# Patient Record
Sex: Female | Born: 1935 | Race: White | Hispanic: No | Smoking: Never smoker
Health system: Southern US, Community
[De-identification: ages and names within clinical notes are randomized; demographics above are authoritative.]

## PROBLEM LIST (undated history)

## (undated) DIAGNOSIS — I82409 Acute embolism and thrombosis of unspecified deep veins of unspecified lower extremity: Secondary | ICD-10-CM

## (undated) DIAGNOSIS — M1990 Unspecified osteoarthritis, unspecified site: Secondary | ICD-10-CM

## (undated) DIAGNOSIS — H269 Unspecified cataract: Secondary | ICD-10-CM

## (undated) HISTORY — PX: CHOLECYSTECTOMY: SHX55

## (undated) HISTORY — DX: Acute embolism and thrombosis of unspecified deep veins of unspecified lower extremity: I82.409

## (undated) HISTORY — DX: Unspecified osteoarthritis, unspecified site: M19.90

## (undated) HISTORY — DX: Unspecified cataract: H26.9

---

## 2008-01-12 ENCOUNTER — Ambulatory Visit: Payer: Self-pay | Admitting: Family Medicine

## 2008-04-13 ENCOUNTER — Ambulatory Visit: Payer: Self-pay | Admitting: Family Medicine

## 2010-09-25 ENCOUNTER — Ambulatory Visit: Payer: Self-pay | Admitting: Dermatology

## 2013-08-03 ENCOUNTER — Encounter (INDEPENDENT_AMBULATORY_CARE_PROVIDER_SITE_OTHER): Payer: Medicare Other | Admitting: Ophthalmology

## 2013-08-03 DIAGNOSIS — H2510 Age-related nuclear cataract, unspecified eye: Secondary | ICD-10-CM

## 2013-08-03 DIAGNOSIS — H43819 Vitreous degeneration, unspecified eye: Secondary | ICD-10-CM

## 2013-08-03 DIAGNOSIS — H348192 Central retinal vein occlusion, unspecified eye, stable: Secondary | ICD-10-CM

## 2013-08-03 DIAGNOSIS — H35039 Hypertensive retinopathy, unspecified eye: Secondary | ICD-10-CM

## 2013-08-03 DIAGNOSIS — I10 Essential (primary) hypertension: Secondary | ICD-10-CM

## 2013-08-31 ENCOUNTER — Encounter (INDEPENDENT_AMBULATORY_CARE_PROVIDER_SITE_OTHER): Payer: Medicare Other | Admitting: Ophthalmology

## 2013-09-07 ENCOUNTER — Encounter (INDEPENDENT_AMBULATORY_CARE_PROVIDER_SITE_OTHER): Payer: Medicare Other | Admitting: Ophthalmology

## 2013-09-14 ENCOUNTER — Encounter (INDEPENDENT_AMBULATORY_CARE_PROVIDER_SITE_OTHER): Payer: Medicare Other | Admitting: Ophthalmology

## 2013-09-14 DIAGNOSIS — H43819 Vitreous degeneration, unspecified eye: Secondary | ICD-10-CM

## 2013-09-14 DIAGNOSIS — H348192 Central retinal vein occlusion, unspecified eye, stable: Secondary | ICD-10-CM

## 2013-09-14 DIAGNOSIS — H2510 Age-related nuclear cataract, unspecified eye: Secondary | ICD-10-CM

## 2013-10-26 ENCOUNTER — Encounter (INDEPENDENT_AMBULATORY_CARE_PROVIDER_SITE_OTHER): Payer: Medicare Other | Admitting: Ophthalmology

## 2013-10-26 DIAGNOSIS — H43819 Vitreous degeneration, unspecified eye: Secondary | ICD-10-CM

## 2013-10-26 DIAGNOSIS — H348192 Central retinal vein occlusion, unspecified eye, stable: Secondary | ICD-10-CM

## 2013-10-26 DIAGNOSIS — I10 Essential (primary) hypertension: Secondary | ICD-10-CM

## 2013-10-26 DIAGNOSIS — H35039 Hypertensive retinopathy, unspecified eye: Secondary | ICD-10-CM

## 2013-10-26 DIAGNOSIS — H3530 Unspecified macular degeneration: Secondary | ICD-10-CM

## 2013-12-14 ENCOUNTER — Encounter (INDEPENDENT_AMBULATORY_CARE_PROVIDER_SITE_OTHER): Payer: Medicare Other | Admitting: Ophthalmology

## 2013-12-14 DIAGNOSIS — H35039 Hypertensive retinopathy, unspecified eye: Secondary | ICD-10-CM

## 2013-12-14 DIAGNOSIS — I10 Essential (primary) hypertension: Secondary | ICD-10-CM

## 2013-12-14 DIAGNOSIS — H3530 Unspecified macular degeneration: Secondary | ICD-10-CM

## 2013-12-14 DIAGNOSIS — H43819 Vitreous degeneration, unspecified eye: Secondary | ICD-10-CM

## 2013-12-14 DIAGNOSIS — H2510 Age-related nuclear cataract, unspecified eye: Secondary | ICD-10-CM

## 2013-12-14 DIAGNOSIS — H348192 Central retinal vein occlusion, unspecified eye, stable: Secondary | ICD-10-CM

## 2014-02-01 ENCOUNTER — Encounter (INDEPENDENT_AMBULATORY_CARE_PROVIDER_SITE_OTHER): Payer: Medicare Other | Admitting: Ophthalmology

## 2014-02-01 DIAGNOSIS — H35039 Hypertensive retinopathy, unspecified eye: Secondary | ICD-10-CM

## 2014-02-01 DIAGNOSIS — H3530 Unspecified macular degeneration: Secondary | ICD-10-CM

## 2014-02-01 DIAGNOSIS — H2510 Age-related nuclear cataract, unspecified eye: Secondary | ICD-10-CM

## 2014-02-01 DIAGNOSIS — H43819 Vitreous degeneration, unspecified eye: Secondary | ICD-10-CM

## 2014-02-01 DIAGNOSIS — H348192 Central retinal vein occlusion, unspecified eye, stable: Secondary | ICD-10-CM

## 2014-02-01 DIAGNOSIS — I10 Essential (primary) hypertension: Secondary | ICD-10-CM

## 2014-02-24 ENCOUNTER — Ambulatory Visit (INDEPENDENT_AMBULATORY_CARE_PROVIDER_SITE_OTHER): Payer: Medicare Other | Admitting: Family Medicine

## 2014-02-24 VITALS — BP 128/82 | HR 80 | Temp 97.9°F | Resp 16 | Ht 63.75 in | Wt 165.8 lb

## 2014-02-24 DIAGNOSIS — M1710 Unilateral primary osteoarthritis, unspecified knee: Secondary | ICD-10-CM

## 2014-02-24 DIAGNOSIS — IMO0002 Reserved for concepts with insufficient information to code with codable children: Secondary | ICD-10-CM

## 2014-02-24 DIAGNOSIS — M79661 Pain in right lower leg: Secondary | ICD-10-CM

## 2014-02-24 DIAGNOSIS — M1711 Unilateral primary osteoarthritis, right knee: Secondary | ICD-10-CM

## 2014-02-24 DIAGNOSIS — M79609 Pain in unspecified limb: Secondary | ICD-10-CM

## 2014-02-24 MED ORDER — METHYLPREDNISOLONE ACETATE 40 MG/ML INJ SUSP (RADIOLOG
80.0000 mg | Freq: Once | INTRAMUSCULAR | Status: AC
  Administered 2014-02-24: 80 mg via INTRA_ARTICULAR

## 2014-02-24 NOTE — Progress Notes (Signed)
80 yo woman who twisted right knee last week and has had persistent pain in the knee ever since with some swelling.  She has had some intermittent pain after activity in the past.  Rx:  Ibuprofen helps for 2 hours  Objective:  NAD Right knee:  Mild synovial thickening, no crepitus, mild medial tenderness, good ROM  Right knee injected without problem with marcaine and depomedrol 80 without complication  Assessment:  Knee arthritis  Plan:  RTC prn.  Craig Staggers, MD

## 2014-02-24 NOTE — Addendum Note (Signed)
Addended by: Roselee Nova A on: 02/24/2014 03:24 PM   Modules accepted: Level of Service

## 2014-02-26 ENCOUNTER — Telehealth: Payer: Self-pay

## 2014-02-26 NOTE — Telephone Encounter (Signed)
Please advise Pain Medication. Pt only received prednisone at [facility].

## 2014-02-26 NOTE — Telephone Encounter (Signed)
Please advise this patient to RTC tomorrow to see Dr. Joseph Art.

## 2014-02-26 NOTE — Telephone Encounter (Signed)
Patient states she is still in pain and wants to know if she can be prescribed something for it. Please return call and advise. CB # [phone]

## 2014-02-26 NOTE — Telephone Encounter (Signed)
Patient called again regarding medication. Please return call.

## 2014-02-27 NOTE — Telephone Encounter (Signed)
Left message to return call 

## 2014-02-27 NOTE — Telephone Encounter (Signed)
Patient notified and voiced understanding. She will RTC tomorrow to see Dr. Joseph Art

## 2014-02-28 ENCOUNTER — Ambulatory Visit (INDEPENDENT_AMBULATORY_CARE_PROVIDER_SITE_OTHER): Payer: Medicare Other

## 2014-02-28 ENCOUNTER — Ambulatory Visit (INDEPENDENT_AMBULATORY_CARE_PROVIDER_SITE_OTHER): Payer: Medicare Other | Admitting: Family Medicine

## 2014-02-28 VITALS — BP 160/78 | HR 68 | Temp 98.1°F | Resp 15 | Ht 64.0 in | Wt 165.8 lb

## 2014-02-28 DIAGNOSIS — M25569 Pain in unspecified knee: Secondary | ICD-10-CM

## 2014-02-28 DIAGNOSIS — M1710 Unilateral primary osteoarthritis, unspecified knee: Secondary | ICD-10-CM

## 2014-02-28 DIAGNOSIS — M25561 Pain in right knee: Secondary | ICD-10-CM

## 2014-02-28 DIAGNOSIS — IMO0002 Reserved for concepts with insufficient information to code with codable children: Secondary | ICD-10-CM

## 2014-02-28 DIAGNOSIS — M1711 Unilateral primary osteoarthritis, right knee: Secondary | ICD-10-CM

## 2014-02-28 MED ORDER — PREDNISONE 20 MG PO TABS
40.0000 mg | ORAL_TABLET | Freq: Every day | ORAL | Status: DC
Start: 2014-02-28 — End: 2014-12-09

## 2014-02-28 MED ORDER — PREDNISONE 20 MG PO TABS: 40.0000 mg | ORAL_TABLET | Freq: Every day | ORAL | Status: DC

## 2014-02-28 NOTE — Progress Notes (Signed)
80 year old woman who is here for followup regarding her right knee pain. She an injection of cortisone 4 days ago it really hasn't improved. She does notice improvement when she takes Advil but it's hurting day and night, more so when she puts weight on it.  Objective: No acute distress We're able to pinpoint an area of tenderness along the medial joint line just anterior to the MCL. She has full range of motion. There is ecchymosis of the area of the injection done a couple days ago but this is nontender and there is no erythema or effusion.  [facility] reading (PRIMARY) by  Dr. Joseph Art.  Right knee: mild to moderate arthritic changes with preservation of joint line.  Small bony body medial central area.  Knee pain, right - Plan: DG Knee Complete 4 Views Right, predniSONE (DELTASONE) 20 MG tablet, DISCONTINUED: predniSONE (DELTASONE) 20 MG tablet  Arthritis of knee, right - Plan: predniSONE (DELTASONE) 20 MG tablet, DISCONTINUED: predniSONE (DELTASONE) 20 MG tablet  May need ortho referral if not better in 48 hours Signed, Robyn Haber, MD

## 2014-02-28 NOTE — Patient Instructions (Signed)
If not improving in 48 hours, please call me to discuss an orthopedic referral

## 2014-03-03 ENCOUNTER — Telehealth: Payer: Self-pay

## 2014-03-03 DIAGNOSIS — IMO0002 Reserved for concepts with insufficient information to code with codable children: Principal | ICD-10-CM

## 2014-03-03 DIAGNOSIS — M25561 Pain in right knee: Secondary | ICD-10-CM

## 2014-03-03 DIAGNOSIS — M1710 Unilateral primary osteoarthritis, unspecified knee: Secondary | ICD-10-CM

## 2014-03-03 NOTE — Telephone Encounter (Signed)
Per patient Dr. Joseph Art requested that she call to give an update on how she was doing. Per patient she has been feeling better but started having pain in her right knee today. She went back to work yesterday and today she has been walking a lot. Patient will take her last dose of "prednisone" tomorrow and has 1 refill left. Patients call back number is [phone]

## 2014-03-04 NOTE — Telephone Encounter (Signed)
Called pt back and advised that we can not RF the prednisone d/t not being recommended to stay on prednisone for multiple rounds. Pt understood. D/W pt that per Dr Lenn Cal [facility] notes he planned to refer to ortho if knee pain lingered more than 48 hrs. Pt agreed to referral and I am putting in order for Dr L. Dr Carlean Jews, Juluis Rainier.

## 2014-03-04 NOTE — Telephone Encounter (Signed)
Pt called back and explained that she mistakenly stated that she had one more refill of prednisone, she actually DOES need more prednisone because of her knee problems.

## 2014-03-08 ENCOUNTER — Encounter (INDEPENDENT_AMBULATORY_CARE_PROVIDER_SITE_OTHER): Payer: Medicare Other | Admitting: Ophthalmology

## 2014-03-08 DIAGNOSIS — H35039 Hypertensive retinopathy, unspecified eye: Secondary | ICD-10-CM

## 2014-03-08 DIAGNOSIS — H348192 Central retinal vein occlusion, unspecified eye, stable: Secondary | ICD-10-CM

## 2014-03-08 DIAGNOSIS — H43819 Vitreous degeneration, unspecified eye: Secondary | ICD-10-CM

## 2014-03-08 DIAGNOSIS — I10 Essential (primary) hypertension: Secondary | ICD-10-CM

## 2014-03-08 DIAGNOSIS — H2510 Age-related nuclear cataract, unspecified eye: Secondary | ICD-10-CM

## 2014-04-19 ENCOUNTER — Encounter (INDEPENDENT_AMBULATORY_CARE_PROVIDER_SITE_OTHER): Payer: Medicare Other | Admitting: Ophthalmology

## 2014-04-19 DIAGNOSIS — I10 Essential (primary) hypertension: Secondary | ICD-10-CM

## 2014-04-19 DIAGNOSIS — H3530 Unspecified macular degeneration: Secondary | ICD-10-CM

## 2014-04-19 DIAGNOSIS — H43819 Vitreous degeneration, unspecified eye: Secondary | ICD-10-CM

## 2014-04-19 DIAGNOSIS — H2510 Age-related nuclear cataract, unspecified eye: Secondary | ICD-10-CM

## 2014-04-19 DIAGNOSIS — H348192 Central retinal vein occlusion, unspecified eye, stable: Secondary | ICD-10-CM

## 2014-04-19 DIAGNOSIS — H35039 Hypertensive retinopathy, unspecified eye: Secondary | ICD-10-CM

## 2014-05-31 ENCOUNTER — Encounter (INDEPENDENT_AMBULATORY_CARE_PROVIDER_SITE_OTHER): Payer: Medicare Other | Admitting: Ophthalmology

## 2014-05-31 DIAGNOSIS — H43813 Vitreous degeneration, bilateral: Secondary | ICD-10-CM

## 2014-05-31 DIAGNOSIS — H3531 Nonexudative age-related macular degeneration: Secondary | ICD-10-CM

## 2014-05-31 DIAGNOSIS — H43811 Vitreous degeneration, right eye: Secondary | ICD-10-CM

## 2014-05-31 DIAGNOSIS — H35033 Hypertensive retinopathy, bilateral: Secondary | ICD-10-CM

## 2014-05-31 DIAGNOSIS — I10 Essential (primary) hypertension: Secondary | ICD-10-CM

## 2014-05-31 DIAGNOSIS — H2511 Age-related nuclear cataract, right eye: Secondary | ICD-10-CM

## 2014-06-07 ENCOUNTER — Encounter (INDEPENDENT_AMBULATORY_CARE_PROVIDER_SITE_OTHER): Payer: Medicare Other | Admitting: Ophthalmology

## 2014-06-07 DIAGNOSIS — H34811 Central retinal vein occlusion, right eye: Secondary | ICD-10-CM

## 2014-07-05 ENCOUNTER — Encounter (INDEPENDENT_AMBULATORY_CARE_PROVIDER_SITE_OTHER): Payer: Medicare Other | Admitting: Ophthalmology

## 2014-07-05 DIAGNOSIS — H3531 Nonexudative age-related macular degeneration: Secondary | ICD-10-CM

## 2014-07-05 DIAGNOSIS — H43813 Vitreous degeneration, bilateral: Secondary | ICD-10-CM

## 2014-07-05 DIAGNOSIS — I10 Essential (primary) hypertension: Secondary | ICD-10-CM

## 2014-07-05 DIAGNOSIS — H34811 Central retinal vein occlusion, right eye: Secondary | ICD-10-CM

## 2014-07-05 DIAGNOSIS — H35033 Hypertensive retinopathy, bilateral: Secondary | ICD-10-CM

## 2014-08-02 ENCOUNTER — Encounter (INDEPENDENT_AMBULATORY_CARE_PROVIDER_SITE_OTHER): Payer: Medicare Other | Admitting: Ophthalmology

## 2014-08-02 DIAGNOSIS — H3531 Nonexudative age-related macular degeneration: Secondary | ICD-10-CM

## 2014-08-02 DIAGNOSIS — I10 Essential (primary) hypertension: Secondary | ICD-10-CM

## 2014-08-02 DIAGNOSIS — H43813 Vitreous degeneration, bilateral: Secondary | ICD-10-CM

## 2014-08-02 DIAGNOSIS — H35033 Hypertensive retinopathy, bilateral: Secondary | ICD-10-CM

## 2014-08-02 DIAGNOSIS — H34811 Central retinal vein occlusion, right eye: Secondary | ICD-10-CM

## 2014-09-06 ENCOUNTER — Encounter (INDEPENDENT_AMBULATORY_CARE_PROVIDER_SITE_OTHER): Payer: Medicare Other | Admitting: Ophthalmology

## 2014-09-06 DIAGNOSIS — H34811 Central retinal vein occlusion, right eye: Secondary | ICD-10-CM

## 2014-09-06 DIAGNOSIS — H2511 Age-related nuclear cataract, right eye: Secondary | ICD-10-CM

## 2014-09-06 DIAGNOSIS — H35033 Hypertensive retinopathy, bilateral: Secondary | ICD-10-CM

## 2014-09-06 DIAGNOSIS — H3531 Nonexudative age-related macular degeneration: Secondary | ICD-10-CM

## 2014-09-06 DIAGNOSIS — I10 Essential (primary) hypertension: Secondary | ICD-10-CM

## 2014-09-06 DIAGNOSIS — H43813 Vitreous degeneration, bilateral: Secondary | ICD-10-CM

## 2014-10-11 ENCOUNTER — Encounter (INDEPENDENT_AMBULATORY_CARE_PROVIDER_SITE_OTHER): Payer: Medicare Other | Admitting: Ophthalmology

## 2014-10-11 DIAGNOSIS — H2511 Age-related nuclear cataract, right eye: Secondary | ICD-10-CM

## 2014-10-11 DIAGNOSIS — H35033 Hypertensive retinopathy, bilateral: Secondary | ICD-10-CM

## 2014-10-11 DIAGNOSIS — I10 Essential (primary) hypertension: Secondary | ICD-10-CM

## 2014-10-11 DIAGNOSIS — H34811 Central retinal vein occlusion, right eye: Secondary | ICD-10-CM

## 2014-10-11 DIAGNOSIS — H3531 Nonexudative age-related macular degeneration: Secondary | ICD-10-CM | POA: Diagnosis not present

## 2014-10-11 DIAGNOSIS — H43813 Vitreous degeneration, bilateral: Secondary | ICD-10-CM

## 2014-11-08 ENCOUNTER — Encounter (INDEPENDENT_AMBULATORY_CARE_PROVIDER_SITE_OTHER): Payer: Medicare Other | Admitting: Ophthalmology

## 2014-11-08 DIAGNOSIS — H35033 Hypertensive retinopathy, bilateral: Secondary | ICD-10-CM | POA: Diagnosis not present

## 2014-11-08 DIAGNOSIS — H43813 Vitreous degeneration, bilateral: Secondary | ICD-10-CM | POA: Diagnosis not present

## 2014-11-08 DIAGNOSIS — I10 Essential (primary) hypertension: Secondary | ICD-10-CM | POA: Diagnosis not present

## 2014-11-08 DIAGNOSIS — H3531 Nonexudative age-related macular degeneration: Secondary | ICD-10-CM

## 2014-11-08 DIAGNOSIS — H34811 Central retinal vein occlusion, right eye: Secondary | ICD-10-CM

## 2014-11-08 DIAGNOSIS — H2511 Age-related nuclear cataract, right eye: Secondary | ICD-10-CM

## 2014-11-10 ENCOUNTER — Inpatient Hospital Stay
Admit: 2014-11-10 | Disposition: A | Discharge status: Skilled Nursing Facility | Payer: Self-pay | Attending: Internal Medicine | Admitting: Internal Medicine

## 2014-11-10 HISTORY — PX: FEMUR SURGERY: SHX943

## 2014-11-10 LAB — BASIC METABOLIC PANEL
Anion Gap: 8 (ref 7–16)
BUN: 17 mg/dL
Calcium, Total: 8.5 mg/dL — ABNORMAL LOW
Chloride: 107 mmol/L
Co2: 27 mmol/L
Creatinine: 0.64 mg/dL
GLUCOSE: 99 mg/dL
Potassium: 3.7 mmol/L
SODIUM: 142 mmol/L

## 2014-11-10 LAB — URINALYSIS, COMPLETE
BACTERIA: NONE SEEN
Bilirubin,UR: NEGATIVE
Blood: NEGATIVE
Glucose,UR: NEGATIVE mg/dL (ref 0–75)
Leukocyte Esterase: NEGATIVE
NITRITE: NEGATIVE
Ph: 6 (ref 4.5–8.0)
Protein: NEGATIVE
Specific Gravity: 1.016 (ref 1.003–1.030)
Squamous Epithelial: <1

## 2014-11-10 LAB — CBC WITH DIFFERENTIAL/PLATELET
BASOS ABS: 0 10*3/uL (ref 0.0–0.1)
Basophil %: 0.4 %
EOS ABS: 0.1 10*3/uL (ref 0.0–0.7)
Eosinophil %: 0.8 %
HCT: 38.2 % (ref 35.0–47.0)
HGB: 12.4 g/dL (ref 12.0–16.0)
LYMPHS ABS: 1 10*3/uL (ref 1.0–3.6)
Lymphocyte %: 9.8 %
MCH: 29.2 pg (ref 26.0–34.0)
MCHC: 32.4 g/dL (ref 32.0–36.0)
MCV: 90 fL (ref 80–100)
MONO ABS: 0.4 x10 3/mm (ref 0.2–0.9)
Monocyte %: 3.4 %
Neutrophil #: 8.9 10*3/uL — ABNORMAL HIGH (ref 1.4–6.5)
Neutrophil %: 85.6 %
Platelet: 185 10*3/uL (ref 150–440)
RBC: 4.23 10*6/uL (ref 3.80–5.20)
RDW: 12.6 % (ref 11.5–14.5)
WBC: 10.4 10*3/uL (ref 3.6–11.0)

## 2014-11-10 LAB — PROTIME-INR
INR: 1
PROTHROMBIN TIME: 13.3 s

## 2014-11-10 LAB — APTT: ACTIVATED PTT: 41.4 s — AB (ref 23.6–35.9)

## 2014-11-11 LAB — CBC WITH DIFFERENTIAL/PLATELET
BASOS PCT: 0.5 %
Basophil #: 0 10*3/uL (ref 0.0–0.1)
EOS ABS: 0.1 10*3/uL (ref 0.0–0.7)
EOS PCT: 1.3 %
HCT: 34.7 % — ABNORMAL LOW (ref 35.0–47.0)
HGB: 11.6 g/dL — ABNORMAL LOW (ref 12.0–16.0)
LYMPHS ABS: 0.7 10*3/uL — AB (ref 1.0–3.6)
Lymphocyte %: 10.1 %
MCH: 30 pg (ref 26.0–34.0)
MCHC: 33.3 g/dL (ref 32.0–36.0)
MCV: 90 fL (ref 80–100)
MONO ABS: 0.5 x10 3/mm (ref 0.2–0.9)
Monocyte %: 6.1 %
NEUTROS PCT: 82 %
Neutrophil #: 6.1 10*3/uL (ref 1.4–6.5)
PLATELETS: 153 10*3/uL (ref 150–440)
RBC: 3.85 10*6/uL (ref 3.80–5.20)
RDW: 12.2 % (ref 11.5–14.5)
WBC: 7.4 10*3/uL (ref 3.6–11.0)

## 2014-11-11 LAB — BASIC METABOLIC PANEL
Anion Gap: 6 — ABNORMAL LOW (ref 7–16)
BUN: 14 mg/dL
CALCIUM: 8.1 mg/dL — AB
CHLORIDE: 104 mmol/L
CO2: 29 mmol/L
Creatinine: 0.66 mg/dL
EGFR (Non-African Amer.): >60
Glucose: 107 mg/dL — ABNORMAL HIGH
Potassium: 3.8 mmol/L
SODIUM: 139 mmol/L

## 2014-11-11 LAB — MAGNESIUM: Magnesium: 1.8 mg/dL

## 2014-11-12 LAB — CBC WITH DIFFERENTIAL/PLATELET
Basophil #: 0 10*3/uL (ref 0.0–0.1)
Basophil %: 0.2 %
EOS ABS: 0 10*3/uL (ref 0.0–0.7)
Eosinophil %: 0 %
HCT: 32.6 % — AB (ref 35.0–47.0)
HGB: 10.9 g/dL — ABNORMAL LOW (ref 12.0–16.0)
LYMPHS ABS: 0.3 10*3/uL — AB (ref 1.0–3.6)
Lymphocyte %: 4 %
MCH: 29.9 pg (ref 26.0–34.0)
MCHC: 33.4 g/dL (ref 32.0–36.0)
MCV: 90 fL (ref 80–100)
MONO ABS: 0.4 x10 3/mm (ref 0.2–0.9)
MONOS PCT: 5.4 %
NEUTROS ABS: 7.3 10*3/uL — AB (ref 1.4–6.5)
Neutrophil %: 90.4 %
Platelet: 138 10*3/uL — ABNORMAL LOW (ref 150–440)
RBC: 3.64 10*6/uL — ABNORMAL LOW (ref 3.80–5.20)
RDW: 12.2 % (ref 11.5–14.5)
WBC: 8.1 10*3/uL (ref 3.6–11.0)

## 2014-11-12 LAB — BASIC METABOLIC PANEL
Anion Gap: 4 — ABNORMAL LOW (ref 7–16)
BUN: 15 mg/dL
CO2: 26 mmol/L
CREATININE: 0.65 mg/dL
Calcium, Total: 7.8 mg/dL — ABNORMAL LOW
Chloride: 108 mmol/L
EGFR (African American): >60
Glucose: 154 mg/dL — ABNORMAL HIGH
Potassium: 4 mmol/L
SODIUM: 138 mmol/L

## 2014-11-13 LAB — CBC WITH DIFFERENTIAL/PLATELET
BASOS ABS: 0 10*3/uL (ref 0.0–0.1)
Basophil %: 0.5 %
Eosinophil #: 0.4 10*3/uL (ref 0.0–0.7)
Eosinophil %: 5.3 %
HCT: 31 % — AB (ref 35.0–47.0)
HGB: 10.2 g/dL — ABNORMAL LOW (ref 12.0–16.0)
LYMPHS ABS: 1 10*3/uL (ref 1.0–3.6)
Lymphocyte %: 12.6 %
MCH: 29.7 pg (ref 26.0–34.0)
MCHC: 32.9 g/dL (ref 32.0–36.0)
MCV: 90 fL (ref 80–100)
MONO ABS: 0.6 x10 3/mm (ref 0.2–0.9)
MONOS PCT: 7.5 %
NEUTROS ABS: 6 10*3/uL (ref 1.4–6.5)
Neutrophil %: 74.1 %
Platelet: 135 10*3/uL — ABNORMAL LOW (ref 150–440)
RBC: 3.44 10*6/uL — ABNORMAL LOW (ref 3.80–5.20)
RDW: 12.3 % (ref 11.5–14.5)
WBC: 8.1 10*3/uL (ref 3.6–11.0)

## 2014-11-14 LAB — CBC WITH DIFFERENTIAL/PLATELET
BASOS PCT: 0.9 %
Basophil #: 0.1 10*3/uL (ref 0.0–0.1)
EOS ABS: 0.4 10*3/uL (ref 0.0–0.7)
EOS PCT: 6.2 %
HCT: 31.1 % — AB (ref 35.0–47.0)
HGB: 10.4 g/dL — ABNORMAL LOW (ref 12.0–16.0)
LYMPHS PCT: 16.3 %
Lymphocyte #: 1.1 10*3/uL (ref 1.0–3.6)
MCH: 30.1 pg (ref 26.0–34.0)
MCHC: 33.5 g/dL (ref 32.0–36.0)
MCV: 90 fL (ref 80–100)
MONOS PCT: 8.2 %
Monocyte #: 0.6 x10 3/mm (ref 0.2–0.9)
NEUTROS ABS: 4.7 10*3/uL (ref 1.4–6.5)
Neutrophil %: 68.4 %
Platelet: 151 10*3/uL (ref 150–440)
RBC: 3.45 10*6/uL — ABNORMAL LOW (ref 3.80–5.20)
RDW: 12.4 % (ref 11.5–14.5)
WBC: 6.9 10*3/uL (ref 3.6–11.0)

## 2014-11-15 ENCOUNTER — Encounter
Admit: 2014-11-15 | Disposition: A | Discharge status: Home / Self Care | Payer: Self-pay | Attending: Internal Medicine | Admitting: Internal Medicine

## 2014-12-05 NOTE — H&P (Signed)
PATIENT NAME:  Elizabeth Fernandez, TREGRE MR#:  [id number] DATE OF BIRTH:  Dec 05, 1933  DATE OF ADMISSION:  11/10/2014  PRIMARY CARE PHYSICIAN:  Dr. Lelon Huh.   REQUESTING PHYSICIAN:  Dr. Nance Pear.    CHIEF COMPLAINT:  Left hip pain.   HISTORY OF PRESENT ILLNESS: The patient is a 81 year old female with a known history of degenerative joint disease, is being admitted for left hip fracture.  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 While in the ED she was found to have comminuted left intertrochanteric fracture and is being admitted for further evaluation and management.   PAST MEDICAL HISTORY:  1.  Degenerative joint disease.  2.  Cataract.   MEDICATIONS AT HOME: Ibuprofen 200 mg 2 tablets p.o. daily.   ALLERGIES: No known drug allergies.   SOCIAL HISTORY: No smoking.  No alcohol. No drugs of abuse. She works at [employer] in [employer], community.   FAMILY HISTORY: Her father died of heart attack, mother had cancer.   PAST SURGICAL HISTORY: Gallbladder surgery.   REVIEW OF SYSTEMS:   CONSTITUTIONAL: No fever, fatigue, weakness.  EYES: No blurred or double vision.  EARS, NOSE, AND THROAT: No tinnitus or ear pain.  RESPIRATORY: No cough, wheeze, or hemoptysis.  CARDIOVASCULAR: No chest pain, orthopnea, edema.  GASTROINTESTINAL: No nausea, vomiting, diarrhea.   GENITOURINARY:  No dysuria, hematuria.   ENDOCRINE:  No polyuria, nocturia.   HEMATOLOGY:  No anemia or easy bruising.   SKIN: No rash or lesion.  MUSCULOSKELETAL: Left hip pain status post fall.  NEUROLOGIC: No tingling, numbness, weakness.  PSYCHIATRY: No history of anxiety or depression.   PHYSICAL EXAMINATION:  VITAL SIGNS: Temperature 98, heart rate 74, respirations 20, blood  pressure 155/93, she is saturating 97% on room air.  GENERAL: The patient is a 81 year old female lying in the bed comfortably without any acute distress.  EYES: Pupils equal, round, and reactive to light and accommodation. No scleral icterus. Extraocular muscles intact.  HEENT: Head is atraumatic, normocephalic.  Oropharynx and nasopharynx clear.   NECK: Supple.  No jugular venous distention.  No thyroid enlargement or tenderness.  LUNGS:  Clear to auscultation bilaterally.  No wheezing, rales, rhonchi, crepitation.   CARDIOVASCULAR:  S1, S2 normal.  No murmurs, gallops.   ABDOMEN:  Soft, nondistended. Bowel sounds present. No organomegaly or masses.  EXTREMITIES: No pedal edema. No cyanosis or clubbing. She has left hip tenderness and some external rotation.  NEUROLOGIC: Cranial nerves II through XII intact.  Muscle strength 5 out of 5 in extremities.  Did not check her left lower extremity strength as she was having a lot of pain.  PSYCHIATRY: The patient is alert and oriented x 3.  SKIN: No obvious rash or lesions.   MUSCULOSKELETAL:  No joint effusion or tenderness except left hip area tenderness.   LABORATORY PANEL: Normal BMP. Normal CBC. Normal coagulation panel.   EKG showed sinus rhythm, no major ST changes.    Chest x-ray showed chronic changes, no acute cardiopulmonary disease.   Left hip x-ray showed comminuted left intertrochanteric fracture.  IMPRESSION AND PLAN:  1.  Preoperative clearance for hip fracture, medically cleared for planned surgery. She will be at low risk. She does not have any  acute cardiac or lung disease or any symptoms. EKG looks unremarkable.   2.  Comminuted left intertrochanteric fracture status post mechanical fall.  Further management per orthopedics.  ER physician has discussed with Dr. Mack Guise.  3.  Degenerative joint disease. We will continue ibuprofen as needed for pain management per orthopedics.   4.  Deep vein thrombosis prophylaxis. We will  start heparin subcutaneous at this time.   CODE STATUS: Full code.   TOTAL TIME TAKING CARE OF THIS PATIENT: 40 minutes.     ____________________________ Lucina Mellow. Manuella Ghazi, MD vss:bu D: 11/10/2014 15:22:39 ET T: 11/10/2014 15:39:09 ET JOB#: [id number]  cc: Magdiel Bartles S. Manuella Ghazi, MD, <Dictator> Kirstie Peri. Caryn Section, MD Timoteo Gaul, MD Remer Macho MD ELECTRONICALLY SIGNED 11/11/2014 10:52

## 2014-12-05 NOTE — Consult Note (Signed)
PATIENT NAME:  Elizabeth Fernandez, Elizabeth Fernandez MR#:  [id number] DATE OF BIRTH:  1934-03-04  DATE OF CONSULTATION:  11/10/2014  REFERRING PHYSICIAN:   CONSULTING PHYSICIAN:  Timoteo Gaul, MD  HISTORY OF PRESENT ILLNESS: The patient is a 81 year old female who fell at work. She works at [employer]. She stepped off a podium at work, where she was working on [employer]. This misstep caused her to have an immediate intense pain in the left hip. She was unable to ambulate on this leg after this injury. EMS was contacted, and the patient was brought to the [facility] Emergency Department. She was diagnosed with a left intertrochanteric hip fracture by x-ray upon arrival to the Emergency Room. The patient is admitted to the hospitalist service for preoperative clearance, and orthopedics was consulted for management of her hip fracture.   PAST MEDICAL HISTORY INCLUDES: Degenerative joint disease and cataracts.   ALLERGIES: No known drug allergies.   MEDICATIONS AT HOME: Include only ibuprofen 200 mg 2 tablets p.o. daily.   SOCIAL HISTORY: The patient denies smoking, alcohol, or drug use. 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    PAST SURGICAL HISTORY: Includes cholecystectomy.   PHYSICAL EXAMINATION:  LEFT HIP: The patient's skin is intact. There is no erythema, ecchymosis, or swelling. During examination, the patient's left leg is in [place] traction. She has external rotation to the left lower extremity with shortening. She has a palpable dorsalis pedis pulse. She can flex and extend all 5 toes. She has intact sensation throughout the left lower extremity.   RADIOLOGY: X-ray films of the left hip, as well as an AP of the pelvis were reviewed. These films demonstrate a comminuted left intertrochanteric hip fracture, which is displaced. The hip joint remains located, and there are no associated pelvic fractures. There is degenerative disease seen in the lumbar spine.    ASSESSMENT: Left intertrochanteric hip fracture, displaced.   PLAN: I explained the patient's injury to her at the bedside today. I have recommended intramedullary fixation for this fracture to allow her to get up and stand and ambulate. I reviewed the risks and benefits of surgery, including infection, bleeding, nerve or blood vessel injury, malunion, nonunion, leg length discrepancy, change in lower extremity rotation, persistent pain, or hardware failure. The development of osteoarthritis and the need for further surgery, including conversion to a total hip arthroplasty. Medical risks include, but are not limited to DVT and pulmonary embolism, myocardial infarction, stroke, pneumonia, respiratory failure, and death. The patient understood these risks. She wishes to proceed with surgery. Her surgery is scheduled for tomorrow at approximately 1:00 p.m. She is n.p.o. after midnight. I have reviewed the patient's labs in preparation for surgery. She has been cleared by the hospitalist for intramedullary fixation of her left hip tomorrow.    ____________________________ Timoteo Gaul, MD klk:mw D: 11/10/2014 18:52:40 ET T: 11/10/2014 19:35:07 ET JOB#: [id number]  cc: Timoteo Gaul, MD, <Dictator> Timoteo Gaul MD ELECTRONICALLY SIGNED 11/12/2014 13:56

## 2014-12-05 NOTE — Discharge Summary (Signed)
PATIENT NAME:  Elizabeth Fernandez, Elizabeth Fernandez MR#:  [id number] DATE OF BIRTH:  17-Feb-1934  DATE OF ADMISSION:  11/10/2014 DATE OF DISCHARGE:  11/15/2014   DISCHARGE DIAGNOSES:  1.  Left hip fracture, status post internal fixation.  2.  Osteoarthritis.   SECONDARY DIAGNOSES:  1.  Degenerative joint disease.  2.  Cataract.   CONSULTATIONS:  Orthopedics, Dr. Thornton Park.   PROCEDURES AND RADIOLOGY: Intramedullary fixation of left intertrochanteric hip fracture on 11/11/2014.   Left hip x-ray on 11/10/2014 showed comminuted left intertrochanteric fracture.   Chest x-ray on 11/09/2014, showed no acute cardiopulmonary disease.   Left hip x-ray on 11/11/2014 showed ORIF of the left femur.   Pelvic x-ray on 11/11/2014 confirmed same.   Left hip x-ray showed post ORIF of the proximal left femur.   MAJOR LABORATORY PANEL: Urinalysis on admission was negative on 11/10/2014.   HISTORY AND SHORT HOSPITAL COURSE: The patient is a 81 year old female with the above-mentioned medical problems who was admitted for left hip pain status post fall, was found to have a comminuted left intertrochanteric hip fracture for which orthopedic consultation was obtained with Dr. Thornton Park who recommended surgery, which was performed on 11/11/2014 with intramedullary fixation of left intertrochanteric hip fracture.  Postoperatively, the patient was evaluated by physical and occupational therapy and was slowly improving. Rehab was recommended by the therapist, where she is being discharged in stable condition.   VITAL SIGNS: On the date of discharge, her vital signs were as follows: Temperature 98.1, heart rate 94 per minute, respirations 18 per minute, blood pressure 130/68. She is saturating 92% on room air.   PERTINENT PHYSICAL EXAMINATION THE DATE OF DISCHARGE:  CARDIOVASCULAR: S1, S2 normal. No murmurs, rubs, or gallop.  LUNGS: Clear to auscultation bilaterally. No wheezing, rales, rhonchi, crepitation.  ABDOMEN:  Soft, benign.  Surgical incision and dressing looks dry and clear. No signs of infection. Circulation sensation and motor function good in the lower extremities. All other physical examination remained at baseline.   DISCHARGE MEDICATIONS:     Medication Instructions  enoxaparin  30 milligram(s) subcutaneous 2 times a day   acetaminophen 325 mg oral tablet  2 tab(s) orally every 4 hours, As needed, -for temperature >100.5   magnesium hydroxide 8% oral suspension  30 milliliter(s) orally every 6 hours, As needed, constipation   ferrous sulfate 325 mg (65 mg elemental iron) oral tablet  1 tab(s) orally 2 times a day (with meals)   docusate calcium 240 mg oral capsule  1 cap(s) orally once a day (at bedtime)   calcium-vitamin d 500 mg-200 intl units oral tablet  1 tab(s) orally 2 times a day (with meals)   bisacodyl 10 mg rectal suppository  1 suppository(ies) rectal once a day (at bedtime), As needed, constipation   acetaminophen-hydrocodone 325 mg-5 mg oral tablet  1 tab(s) orally every 4 to 6 hours, As Needed, moderate pain (4-6/10)    DISCHARGE DIET: Regular.   DISCHARGE ACTIVITY: As tolerated.   DISCHARGE INSTRUCTIONS AND FOLLOW-UP: The patient was instructed to follow up with her primary care physician, Dr. Lelon Huh in 2 to 4 weeks. She will need follow-up with Dr. Thornton Park from orthopedics in 1 to 2 weeks. She will get physical therapy evaluation and management while at the facility.    TOTAL TIME SPENT ON THIS PATIENT:  45 minutes.  ____________________________ Lucina Mellow. Manuella Ghazi, MD vss:DT D: 11/15/2014 07:49:07 ET T: 11/15/2014 08:42:31 ET JOB#: [id number]  cc: Cailean Heacock S. Manuella Ghazi, MD, <Dictator> Elenore Rota  Courtney Heys, MD Timoteo Gaul, MD  Lucina Mellow [facility] MD ELECTRONICALLY SIGNED 11/18/2014 10:31

## 2014-12-05 NOTE — Op Note (Signed)
PATIENT NAME:  Elizabeth Fernandez, BRANDT MR#:  [id number] DATE OF BIRTH:  October 02, 1933  DATE OF PROCEDURE:  11/11/2014  PREOPERATIVE DIAGNOSIS: Left intertrochanteric hip fracture.  POSTOPERATIVE DIAGNOSIS: Left intertrochanteric hip fracture.   PROCEDURE: Intramedullary fixation of left intertrochanteric hip fracture.   SURGEON: Timoteo Gaul, MD   ANESTHESIA: General.   IMPLANTS: Biomet short Affixus nail (9 x 180 mm), a 10 x 100 mm lag screw and a 36 mm interlocking screw.   ESTIMATED BLOOD LOSS: 50 mL.   COMPLICATIONS: None.   INDICATIONS FOR PROCEDURE: The patient is a 81 year old female who sustained a fall stepping down off a platform at work. She had pain immediately following this incident and was unable to bear weight. She was brought to the [facility] Emergency Department where she was diagnosed with an intertrochanteric hip fracture. She was admitted to the hospitalist service for preoperative clearance. I explained to the patient about her injury and have recommended intramedullary fixation. She understands the risks of surgery include infection, bleeding, nerve or blood vessel injury, malunion, nonunion, leg length discrepancy, change in lower extremity rotation, persistent left hip pain, hardware failure and the need for further surgery. Medical risks include, but are not limited to DVT and pulmonary embolism, myocardial infarction, stroke, pneumonia, respiratory failure, and death. The patient understood these risks and wished to proceed. I marked the left hip with the word "yes" and my initials according to the hospital's correct site of surgery protocol. I spoke with the patient's family in the preoperative area prior to surgery to answer their questions.   PROCEDURE NOTE: The patient was brought to the Operating Room. She had a slightly elevated PTT and was therefore not a candidate for spinal anesthetic. She underwent general anesthesia. She was positioned supine on the  fracture table. The left leg was placed in a legholder and the right leg was placed in a hemi- lithotomy position. Traction and gentle internal rotation were applied to the fracture. C-arm images in the AP and lateral planes were performed to ensure that the fracture was well aligned. The patient was then prepped and draped in a sterile fashion.   A timeout was performed to verify the patient's name, date of birth, medical record number, correct site for surgery, and correct procedure to be performed. It was also used to verify the patient had received antibiotics and all appropriate instruments, implants, and radiographic studies were available in the room. Once all in attendance were in agreement, the case began.   Proposed incisions were drawn out with a surgical marker based upon bony landmarks. These incisions were confirmed on AP and lateral C-arm images. An incision was then made superior to the tip of the greater trochanter. A deep #10 blade was used to then incise the deep fascia. This allowed for palpation of the tip of the greater trochanter. The abductor muscle was split in line with its fibers. A drill pin was advanced through the tip of the greater trochanter into the proximal femur to the level of the lesser trochanter. This was overdrilled with a proximal femoral drill. This allowed for placement of a 9 x 180 mm Biomet short Affixus nail. The position of the nail was confirmed on AP and lateral images. The fracture was confirmed that it remained well reduced.   A drill sleeve was then placed through the guide arm of the Affixus nail for placement of the lag screw. A small stab incision allowed for this drill guide to be placed along  the lateral femur. A threaded drill pin was then advanced across the fracture site and into the femoral head. Its position was confirmed on AP and lateral C-arm images. A tip apex distance of less than 25 mm was achieved. This was then overdrilled to a depth of 100  mm after the lag screw depth was measured with a depth gauge.   A cannulated drill for the lag screw was then advanced over the threaded guide pin. A 100 mm lag screw was then advanced into position by hand.   The lag screw position was confirmed on AP and lateral C-arm images. The final step was for placement of the distal interlocking screw. The drill guide for the interlocking drill was placed along the lateral cortex of the femur through a small stab incision. The drill was advanced bicortically and measured with a depth gauge. It was measured to be 36 mm in length. A 36 mm distal interlocking screw was then advanced into position by hand using a screwdriver. The final images of the fracture construct were taken on C-arm, including a perfect circle view to allow visualization of the distal interlocking screw through the intramedullary rod.   All incisions were copiously irrigated. The deep fascia was repaired proximally using an interrupted 0 Vicryl. The subcutaneous tissues were closed with 0 and 2-0 Vicryl. The small stab incisions were closed with 2-0 Vicryl and the skin was approximated with staples. A dry sterile dressing was applied. The patient was brought to the PACU in stable condition. I spoke with the patient's son in the patient's hospital room to let him know the case had gone without complication and that the patient was stable in the recovery room.     ____________________________ Timoteo Gaul, MD klk:at D: 11/12/2014 13:43:00 ET T: 11/12/2014 13:58:18 ET JOB#: [id number]  cc: Timoteo Gaul, MD, <Dictator> Timoteo Gaul MD ELECTRONICALLY SIGNED 11/14/2014 12:51

## 2014-12-09 ENCOUNTER — Encounter: Payer: Self-pay | Admitting: Nurse Practitioner

## 2014-12-09 ENCOUNTER — Ambulatory Visit (INDEPENDENT_AMBULATORY_CARE_PROVIDER_SITE_OTHER): Payer: Medicare Other | Admitting: Nurse Practitioner

## 2014-12-09 VITALS — BP 132/72 | HR 78 | Temp 97.5°F | Resp 14 | Ht 64.0 in | Wt 148.8 lb

## 2014-12-09 DIAGNOSIS — Z7189 Other specified counseling: Secondary | ICD-10-CM | POA: Diagnosis not present

## 2014-12-09 DIAGNOSIS — Z8781 Personal history of (healed) traumatic fracture: Secondary | ICD-10-CM

## 2014-12-09 DIAGNOSIS — H44811 Hemophthalmos, right eye: Secondary | ICD-10-CM

## 2014-12-09 DIAGNOSIS — Z7689 Persons encountering health services in other specified circumstances: Secondary | ICD-10-CM | POA: Insufficient documentation

## 2014-12-09 NOTE — Assessment & Plan Note (Signed)
Left femur fx. Moving from [facility] to home. She would like to continue PT and they will contact us.

## 2014-12-09 NOTE — Patient Instructions (Addendum)
Welcome to [facility]!   Follow up in 2 months.   Let us know if you need anything today.

## 2014-12-09 NOTE — Progress Notes (Signed)
Subjective:    Patient ID: Elizabeth Fernandez, female    DOB: 07-10-1934, 81 y.o.   MRN: [id number]  HPI  Elizabeth Fernandez is a 81 yo female establishing care today.    1) Health Maintenance-   Diet- 3 meals a day   Exercise- PT   Immunizations- UTD  Mammogram- N/A  Pap- N/A  Bone Density- femur fx needs if hasn't had  Colonoscopy- Unknown   Eye Exam- every 5-6 weeks due to injections in right eye   Dental Exam- UTD  Only at [facility] for rehab for 1 month and staying till Sat.  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    Review of Systems  Constitutional: Negative for fever, chills, diaphoresis and fatigue.  HENT: Negative for tinnitus and trouble swallowing.   Eyes: Negative for visual disturbance.  Respiratory: Negative for chest tightness, shortness of breath and wheezing.   Cardiovascular: Negative for chest pain, palpitations and leg swelling.  Gastrointestinal: Negative for nausea, vomiting, diarrhea and constipation.  Genitourinary: Negative for difficulty urinating.  Musculoskeletal: Positive for arthralgias. Negative for back pain and neck pain.       Left femur sore due to recent fracture  Skin: Negative for rash.  Neurological: Negative for dizziness, weakness, numbness and headaches.  Hematological: Does not bruise/bleed easily.  Psychiatric/Behavioral: The patient is not nervous/anxious.    Past Medical History  Diagnosis Date  . Cataract     Surgery scheduled for 03/2014  . Arthritis     both knees    History   Social History  . Marital Status: Married    Spouse Name: N/A  .  Number of Children: N/A  . Years of Education: N/A   Occupational History  . Not on file.   Social History Main Topics  . Smoking status: Never Smoker   . Smokeless tobacco: Not on file  . Alcohol Use: No  . Drug Use: No  . Sexual Activity: Not Currently   Other Topics Concern  . Not on file   Social History Narrative   May go back to work after recovery    Lives with son    Children- 2    Grandchildren- none    Pets: 1 dog inside    Enjoys- Reading and mowing yard        Past Surgical History  Procedure Laterality Date  . Cholecystectomy    . Femur surgery  11/10/2014    Family History  Problem Relation Age of Onset  . Cancer Mother   . Heart disease Father     Allergies  Allergen Reactions  . Morphine And Related     No current outpatient prescriptions on file prior to visit.   No current facility-administered medications on file prior to visit.      Objective:   Physical Exam  Constitutional: Elizabeth Fernandez is oriented to person, place, and time. Elizabeth Fernandez appears well-developed and well-nourished. No distress.  HENT:  Head: Normocephalic and atraumatic.  Right Ear: External ear normal.  Left Ear: External ear normal.  Cardiovascular:  Normal rate, regular rhythm and normal heart sounds.  Exam reveals no gallop and no friction rub.   No murmur heard. Pulmonary/Chest: Effort normal and breath sounds normal. No respiratory distress. Elizabeth Fernandez has no wheezes. Elizabeth Fernandez has no rales. Elizabeth Fernandez exhibits no tenderness.  Neurological: Elizabeth Fernandez is alert and oriented to person, place, and time. No cranial nerve deficit. Elizabeth Fernandez exhibits normal muscle tone. Coordination normal.  Skin: Skin is warm and dry. No rash noted. Elizabeth Fernandez is not diaphoretic.  Well healing scar on left hip from surgery  Psychiatric: Elizabeth Fernandez has a normal mood and affect. Her behavior is normal. Judgment and thought content normal.      Assessment & Plan:

## 2014-12-13 ENCOUNTER — Encounter (INDEPENDENT_AMBULATORY_CARE_PROVIDER_SITE_OTHER): Payer: Medicare Other | Admitting: Ophthalmology

## 2014-12-18 DIAGNOSIS — H5789 Other specified disorders of eye and adnexa: Secondary | ICD-10-CM | POA: Insufficient documentation

## 2014-12-18 NOTE — Assessment & Plan Note (Signed)
Discussed acute and chronic issues. Reviewed health maintenance measures, PFSHx, and immunizations.   

## 2014-12-21 ENCOUNTER — Ambulatory Visit: Payer: Self-pay | Admitting: Nurse Practitioner

## 2015-01-05 ENCOUNTER — Encounter (INDEPENDENT_AMBULATORY_CARE_PROVIDER_SITE_OTHER): Payer: Medicare Other | Admitting: Ophthalmology

## 2015-01-05 DIAGNOSIS — H3531 Nonexudative age-related macular degeneration: Secondary | ICD-10-CM | POA: Diagnosis not present

## 2015-01-05 DIAGNOSIS — H43813 Vitreous degeneration, bilateral: Secondary | ICD-10-CM

## 2015-01-05 DIAGNOSIS — H2511 Age-related nuclear cataract, right eye: Secondary | ICD-10-CM

## 2015-01-05 DIAGNOSIS — H34811 Central retinal vein occlusion, right eye: Secondary | ICD-10-CM | POA: Diagnosis not present

## 2015-01-05 DIAGNOSIS — I10 Essential (primary) hypertension: Secondary | ICD-10-CM

## 2015-01-05 DIAGNOSIS — H35033 Hypertensive retinopathy, bilateral: Secondary | ICD-10-CM | POA: Diagnosis not present

## 2015-01-27 ENCOUNTER — Encounter (INDEPENDENT_AMBULATORY_CARE_PROVIDER_SITE_OTHER): Payer: Medicare Other | Admitting: Ophthalmology

## 2015-01-27 DIAGNOSIS — H35033 Hypertensive retinopathy, bilateral: Secondary | ICD-10-CM | POA: Diagnosis not present

## 2015-01-27 DIAGNOSIS — H43813 Vitreous degeneration, bilateral: Secondary | ICD-10-CM | POA: Diagnosis not present

## 2015-01-27 DIAGNOSIS — I10 Essential (primary) hypertension: Secondary | ICD-10-CM

## 2015-01-27 DIAGNOSIS — H3531 Nonexudative age-related macular degeneration: Secondary | ICD-10-CM

## 2015-01-27 DIAGNOSIS — H34811 Central retinal vein occlusion, right eye: Secondary | ICD-10-CM

## 2015-02-09 ENCOUNTER — Ambulatory Visit: Payer: Medicare Other | Admitting: Nurse Practitioner

## 2015-03-24 ENCOUNTER — Encounter (INDEPENDENT_AMBULATORY_CARE_PROVIDER_SITE_OTHER): Payer: Medicare Other | Admitting: Ophthalmology

## 2015-03-24 DIAGNOSIS — H34811 Central retinal vein occlusion, right eye: Secondary | ICD-10-CM | POA: Diagnosis not present

## 2015-03-24 DIAGNOSIS — H3531 Nonexudative age-related macular degeneration: Secondary | ICD-10-CM | POA: Diagnosis not present

## 2015-03-24 DIAGNOSIS — H43813 Vitreous degeneration, bilateral: Secondary | ICD-10-CM

## 2015-06-02 ENCOUNTER — Encounter (INDEPENDENT_AMBULATORY_CARE_PROVIDER_SITE_OTHER): Payer: Medicare Other | Admitting: Ophthalmology

## 2015-06-02 DIAGNOSIS — H43813 Vitreous degeneration, bilateral: Secondary | ICD-10-CM | POA: Diagnosis not present

## 2015-06-02 DIAGNOSIS — H34811 Central retinal vein occlusion, right eye, with macular edema: Secondary | ICD-10-CM

## 2015-06-02 DIAGNOSIS — H353131 Nonexudative age-related macular degeneration, bilateral, early dry stage: Secondary | ICD-10-CM

## 2015-06-02 DIAGNOSIS — H2511 Age-related nuclear cataract, right eye: Secondary | ICD-10-CM

## 2015-06-09 ENCOUNTER — Telehealth: Payer: Self-pay | Admitting: Nurse Practitioner

## 2015-06-09 NOTE — Telephone Encounter (Signed)
Left msg for pt to call office to schedule AWV

## 2015-07-14 ENCOUNTER — Encounter (INDEPENDENT_AMBULATORY_CARE_PROVIDER_SITE_OTHER): Payer: Medicare Other | Admitting: Ophthalmology

## 2015-07-14 DIAGNOSIS — H34811 Central retinal vein occlusion, right eye, with macular edema: Secondary | ICD-10-CM

## 2015-07-14 DIAGNOSIS — H353131 Nonexudative age-related macular degeneration, bilateral, early dry stage: Secondary | ICD-10-CM

## 2015-07-14 DIAGNOSIS — H43813 Vitreous degeneration, bilateral: Secondary | ICD-10-CM

## 2015-07-26 ENCOUNTER — Encounter: Payer: Self-pay | Admitting: Nurse Practitioner

## 2015-07-26 ENCOUNTER — Ambulatory Visit (INDEPENDENT_AMBULATORY_CARE_PROVIDER_SITE_OTHER): Payer: Medicare Other | Admitting: Nurse Practitioner

## 2015-07-26 VITALS — BP 140/70 | HR 78 | Temp 97.8°F | Resp 16 | Ht 64.0 in | Wt 140.0 lb

## 2015-07-26 DIAGNOSIS — Z23 Encounter for immunization: Secondary | ICD-10-CM

## 2015-07-26 DIAGNOSIS — F418 Other specified anxiety disorders: Secondary | ICD-10-CM

## 2015-07-26 DIAGNOSIS — Z8781 Personal history of (healed) traumatic fracture: Secondary | ICD-10-CM

## 2015-07-26 NOTE — Patient Instructions (Addendum)
Please let me know if you need anything. Stay social, do things you enjoy, and try Melatonin over-the-counter at night if trouble sleeping persists.   Follow up in 1 month after your 2nd opinion to discuss.
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
## 2015-08-02 ENCOUNTER — Encounter: Payer: Self-pay | Admitting: Nurse Practitioner

## 2015-08-02 DIAGNOSIS — F418 Other specified anxiety disorders: Secondary | ICD-10-CM | POA: Insufficient documentation

## 2015-08-02 NOTE — Assessment & Plan Note (Signed)
Patient has completed course of PT and has been cleared by her Orthopedic Surgeon, Dr. Mack Guise, to return to work. A second opinion is requested by the patient and her lawyer. She will have this in Jan. A note was written for her to possibly return to work after this second opinion.
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
## 2015-09-08 ENCOUNTER — Encounter (INDEPENDENT_AMBULATORY_CARE_PROVIDER_SITE_OTHER): Payer: Medicare Other | Admitting: Ophthalmology

## 2015-09-08 DIAGNOSIS — H43813 Vitreous degeneration, bilateral: Secondary | ICD-10-CM

## 2015-09-08 DIAGNOSIS — H353121 Nonexudative age-related macular degeneration, left eye, early dry stage: Secondary | ICD-10-CM

## 2015-09-08 DIAGNOSIS — H34811 Central retinal vein occlusion, right eye, with macular edema: Secondary | ICD-10-CM

## 2015-09-08 DIAGNOSIS — I10 Essential (primary) hypertension: Secondary | ICD-10-CM | POA: Diagnosis not present

## 2015-09-08 DIAGNOSIS — H35033 Hypertensive retinopathy, bilateral: Secondary | ICD-10-CM

## 2015-09-22 DIAGNOSIS — H2511 Age-related nuclear cataract, right eye: Secondary | ICD-10-CM | POA: Diagnosis not present

## 2015-09-22 DIAGNOSIS — H5200 Hypermetropia, unspecified eye: Secondary | ICD-10-CM | POA: Diagnosis not present

## 2015-09-22 DIAGNOSIS — H40013 Open angle with borderline findings, low risk, bilateral: Secondary | ICD-10-CM | POA: Diagnosis not present

## 2015-09-22 DIAGNOSIS — H25011 Cortical age-related cataract, right eye: Secondary | ICD-10-CM | POA: Diagnosis not present

## 2015-09-22 DIAGNOSIS — H35363 Drusen (degenerative) of macula, bilateral: Secondary | ICD-10-CM | POA: Diagnosis not present

## 2015-09-27 ENCOUNTER — Telehealth: Payer: Self-pay | Admitting: Nurse Practitioner

## 2015-09-27 NOTE — Telephone Encounter (Signed)
Pt notified of appointment for medical clearance

## 2015-09-27 NOTE — Telephone Encounter (Signed)
Pt needs  medical clearence for her upcoming surgery on 10/11/15. Please call her at home to let her know this has been done. Please fax the medical clearence to Dr. Payton Emerald office at [phone].

## 2015-09-28 ENCOUNTER — Other Ambulatory Visit: Payer: Self-pay | Admitting: Nurse Practitioner

## 2015-10-06 ENCOUNTER — Encounter: Payer: Self-pay | Admitting: Nurse Practitioner

## 2015-10-06 ENCOUNTER — Ambulatory Visit (INDEPENDENT_AMBULATORY_CARE_PROVIDER_SITE_OTHER): Payer: Medicare Other | Admitting: Nurse Practitioner

## 2015-10-06 VITALS — BP 108/60 | HR 68 | Temp 97.7°F | Resp 16 | Ht 64.0 in | Wt 145.2 lb

## 2015-10-06 DIAGNOSIS — H269 Unspecified cataract: Secondary | ICD-10-CM

## 2015-10-06 DIAGNOSIS — H44811 Hemophthalmos, right eye: Secondary | ICD-10-CM | POA: Diagnosis not present

## 2015-10-06 DIAGNOSIS — F418 Other specified anxiety disorders: Secondary | ICD-10-CM | POA: Diagnosis not present

## 2015-10-06 LAB — COMPREHENSIVE METABOLIC PANEL
ALK PHOS: 86 U/L (ref 39–117)
ALT: 12 U/L (ref 0–35)
AST: 15 U/L (ref 0–37)
Albumin: 3.9 g/dL (ref 3.5–5.2)
BUN: 18 mg/dL (ref 6–23)
CO2: 32 mEq/L (ref 19–32)
Calcium: 8.8 mg/dL (ref 8.4–10.5)
Chloride: 107 mEq/L (ref 96–112)
Creatinine, Ser: 0.7 mg/dL (ref 0.40–1.20)
GFR: 85.56 mL/min (ref >60.00)
Glucose, Bld: 86 mg/dL (ref 70–99)
POTASSIUM: 4.4 meq/L (ref 3.5–5.1)
Sodium: 142 mEq/L (ref 135–145)
Total Bilirubin: 0.4 mg/dL (ref 0.2–1.2)
Total Protein: 6.5 g/dL (ref 6.0–8.3)

## 2015-10-06 LAB — CBC WITH DIFFERENTIAL/PLATELET
Basophils Absolute: 0 10*3/uL (ref 0.0–0.1)
Basophils Relative: 1 % (ref 0.0–3.0)
EOS PCT: 5.2 % — AB (ref 0.0–5.0)
Eosinophils Absolute: 0.2 10*3/uL (ref 0.0–0.7)
HCT: 35.4 % — ABNORMAL LOW (ref 36.0–46.0)
Hemoglobin: 12 g/dL (ref 12.0–15.0)
LYMPHS ABS: 1.3 10*3/uL (ref 0.7–4.0)
LYMPHS PCT: 27.4 % (ref 12.0–46.0)
MCHC: 33.8 g/dL (ref 30.0–36.0)
MCV: 89.5 fl (ref 78.0–100.0)
MONOS PCT: 6.5 % (ref 3.0–12.0)
Monocytes Absolute: 0.3 10*3/uL (ref 0.1–1.0)
NEUTROS ABS: 2.9 10*3/uL (ref 1.4–7.7)
Neutrophils Relative %: 59.9 % (ref 43.0–77.0)
Platelets: 217 10*3/uL (ref 150.0–400.0)
RBC: 3.96 Mil/uL (ref 3.87–5.11)
RDW: 12.9 % (ref 11.5–15.5)
WBC: 4.8 10*3/uL (ref 4.0–10.5)

## 2015-10-06 NOTE — Assessment & Plan Note (Signed)
Had left removed with success in past Clearing today for right eye Labs and EKG obtained today Letter clearing her sent to fax provided

## 2015-10-06 NOTE — Assessment & Plan Note (Signed)
Dr. Herbert Deaner is doing Right eye cataract removal on 10/11/15 per pt Note clearing her for this sent to fax provided   EKG performed and interpreted by myself. Interpretation is NSR, normal rate, normal axis, 1 PVC.   CBC w/ diff and CMET obtained

## 2015-10-06 NOTE — Assessment & Plan Note (Addendum)
Pt is improving. She has support and a driver for her surgery next week. Having cataract surgery right eye

## 2015-10-06 NOTE — Progress Notes (Signed)
Patient ID: Elizabeth Fernandez, female    DOB: 1934/04/04  Age: 82 y.o. MRN: [id number]  CC: Medical Clearance   HPI TIFANIE NEUROHR presents for medical clearance for cataract surgery.   1) Pt is having cataract surgery on her right eye on 10/11/15 by Dr. Herbert Deaner. She is requesting a note to be faxed to his office to clear her for surgery. She has had a left femur fracture last year that was well tolerated. She is having some anxiety about returning to work.  History Juliya has a past medical history of Cataract and Arthritis.   She has past surgical history that includes Cholecystectomy and Femur Surgery (11/10/2014).   Her family history includes Cancer in her mother; Heart disease in her father.She reports that she has never smoked. She does not have any smokeless tobacco history on file. She reports that she does not drink alcohol or use illicit drugs.  Outpatient Prescriptions Prior to Visit  Medication Sig Dispense Refill  . ibuprofen (ADVIL,MOTRIN) 200 MG tablet Take 200 mg by mouth every 6 (six) hours as needed.    Marland Kitchen acetaminophen (TYLENOL) 325 MG tablet Take 650 mg by mouth every 4 (four) hours as needed. Reported on 10/06/2015    . alum & mag hydroxide-simeth (GELUSIL) [id number] MG suspension Chew by mouth every 6 (six) hours as needed for indigestion or heartburn. Reported on 10/06/2015    . Amino Acids-Protein Hydrolys (FEEDING SUPPLEMENT, PRO-STAT SUGAR FREE 64,) LIQD Take 30 mLs by mouth 2 (two) times daily. Reported on 10/06/2015    . bisacodyl (DULCOLAX) 10 MG suppository Place 10 mg rectally as needed for moderate constipation. Reported on 10/06/2015    . calcium-vitamin D (OSCAL WITH D) 500-200 MG-UNIT per tablet Take 1 tablet by mouth 2 (two) times daily. Reported on 10/06/2015    . docusate calcium (SURFAK) 240 MG capsule Take 240 mg by mouth at bedtime. Reported on 10/06/2015    . magnesium hydroxide (MILK OF MAGNESIA) 400 MG/5ML suspension Take 5 mLs by mouth daily as needed for mild  constipation. Reported on 10/06/2015    . traZODone (DESYREL) 50 MG tablet Take 50 mg by mouth at bedtime. Reported on 10/06/2015     No facility-administered medications prior to visit.    ROS Review of Systems  Constitutional: Negative for fever, chills, diaphoresis and fatigue.  Eyes: Positive for visual disturbance.       Right eye cataract  Respiratory: Negative for chest tightness, shortness of breath and wheezing.   Cardiovascular: Negative for chest pain, palpitations and leg swelling.  Gastrointestinal: Negative for nausea, vomiting and diarrhea.  Skin: Negative for rash.  Neurological: Negative for dizziness, weakness, numbness and headaches.  Psychiatric/Behavioral: The patient is not nervous/anxious.     Objective:  BP 108/60 mmHg  Pulse 68  Temp(Src) 97.7 F (36.5 C) (Oral)  Resp 16  Ht 5\' 4"  (1.626 m)  Wt 145 lb 3.2 oz (65.862 kg)  BMI 24.91 kg/m2  SpO2 98%  Physical Exam  Constitutional: She is oriented to person, place, and time. She appears well-developed and well-nourished. No distress.  HENT:  Head: Normocephalic and atraumatic.  Right Ear: External ear normal.  Left Ear: External ear normal.  Eyes: Conjunctivae and EOM are normal. Pupils are equal, round, and reactive to light. Right eye exhibits no discharge. Left eye exhibits no discharge. No scleral icterus.  Cardiovascular: Normal rate, regular rhythm and normal heart sounds.  Exam reveals no gallop and no friction rub.   No  murmur heard. Pulmonary/Chest: Effort normal and breath sounds normal. No respiratory distress. She has no wheezes. She has no rales. She exhibits no tenderness.  Neurological: She is alert and oriented to person, place, and time. No cranial nerve deficit. She exhibits normal muscle tone. Coordination normal.  Skin: Skin is warm and dry. No rash noted. She is not diaphoretic.  Psychiatric: She has a normal mood and affect. Her behavior is normal. Judgment and thought content normal.    Assessment & Plan:   Lolitha was seen today for medical clearance.  Diagnoses and all orders for this visit:  Situational anxiety -     EKG 12-Lead -     CBC w/Diff -     Comprehensive metabolic panel  Hemorrhagic eye, right -     EKG 12-Lead -     CBC w/Diff -     Comprehensive metabolic panel  Cataract -     EKG 12-Lead -     CBC w/Diff -     Comprehensive metabolic panel   I am having Ms. Simona Huh maintain her acetaminophen, calcium-vitamin D, docusate calcium, alum & mag hydroxide-simeth, feeding supplement (PRO-STAT SUGAR FREE 64), traZODone, bisacodyl, magnesium hydroxide, and ibuprofen.  No orders of the defined types were placed in this encounter.     Follow-up: Return if symptoms worsen or fail to improve.

## 2015-10-06 NOTE — Patient Instructions (Signed)
Good luck next week! 

## 2015-10-11 DIAGNOSIS — H2511 Age-related nuclear cataract, right eye: Secondary | ICD-10-CM | POA: Diagnosis not present

## 2015-10-24 ENCOUNTER — Ambulatory Visit (INDEPENDENT_AMBULATORY_CARE_PROVIDER_SITE_OTHER): Payer: Medicare Other

## 2015-10-24 VITALS — BP 128/62 | HR 73 | Temp 97.4°F | Resp 12 | Ht 64.0 in | Wt 146.0 lb

## 2015-10-24 DIAGNOSIS — Z0000 Encounter for general adult medical examination without abnormal findings: Secondary | ICD-10-CM

## 2015-10-24 NOTE — Patient Instructions (Addendum)
  Elizabeth Fernandez , Thank you for taking time to come for your Medicare Wellness Visit. I appreciate your ongoing commitment to your health goals. Please review the following plan we discussed and let me know if I can assist you in the future.   Follow up with Lorane Gell, NP   This is a list of the screening recommended for you and due dates:  Health Maintenance  Topic Date Due  . Tetanus Vaccine  09/08/1954  . Shingles Vaccine  09/09/1995  . DEXA scan (bone density measurement)  09/08/2000  . Pneumonia vaccines (1 of 2 - PCV13) 09/08/2000  . Flu Shot  03/06/2016    Bone Densitometry Bone densitometry is an imaging test that uses a special X-ray to measure the amount of calcium and other minerals in your bones (bone density). This test is also known as a bone mineral density test or dual-energy X-ray absorptiometry (DXA). The test can measure bone density at your hip and your spine. It is similar to having a regular X-ray. You may have this test to:  Diagnose a condition that causes weak or thin bones (osteoporosis).  Predict your risk of a broken bone (fracture).  Determine how well osteoporosis treatment is working. LET [facility] CARE PROVIDER KNOW ABOUT:  Any allergies you have.  All medicines you are taking, including vitamins, herbs, eye drops, creams, and over-the-counter medicines.  Previous problems you or members of your family have had with the use of anesthetics.  Any blood disorders you have.  Previous surgeries you have had.  Medical conditions you have.  Possibility of pregnancy.  Any other medical test you had within the previous 14 days that used contrast material. RISKS AND COMPLICATIONS Generally, this is a safe procedure. However, problems can occur and may include the following:  This test exposes you to a very small amount of radiation.  The risks of radiation exposure may be greater to unborn children. BEFORE THE PROCEDURE  Do not take any calcium  supplements for 24 hours before having the test. You can otherwise eat and drink what you usually do.  Take off all metal jewelry, eyeglasses, dental appliances, and any other metal objects. PROCEDURE  You may lie on an exam table. There will be an X-ray generator below you and an imaging device above you.  Other devices, such as boxes or braces, may be used to position your body properly for the scan.  You will need to lie still while the machine slowly scans your body.  The images will show up on a computer monitor. AFTER THE PROCEDURE You may need more testing at a later time.   This information is not intended to replace advice given to you by your health care provider. Make sure you discuss any questions you have with your health care provider.   Document Released: 08/14/2004 Document Revised: 08/13/2014 Document Reviewed: 12/31/2013 Elsevier Interactive Patient Education [employer].

## 2015-10-24 NOTE — Progress Notes (Signed)
Subjective:   Elizabeth Fernandez is a 82 y.o. female who presents for an Initial Medicare Annual Wellness Visit.  Review of Systems    No ROS.  Medicare Wellness Visit.  Cardiac Risk Factors include: advanced age (>30men, >48 women)     Objective:    Today's Vitals   10/24/15 1315  BP: 128/62  Pulse: 73  Temp: 97.4 F (36.3 C)  TempSrc: Oral  Resp: 12  Height: 5\' 4"  (1.626 m)  Weight: 146 lb (66.225 kg)  SpO2: 98%   Body mass index is 25.05 kg/(m^2).   Current Medications (verified) Outpatient Encounter Prescriptions as of 10/24/2015  Medication Sig  . calcium-vitamin D (OSCAL WITH D) 500-200 MG-UNIT per tablet Take 1 tablet by mouth 2 (two) times daily. Reported on 10/06/2015  . docusate calcium (SURFAK) 240 MG capsule Take 240 mg by mouth at bedtime. Reported on 10/06/2015  . ibuprofen (ADVIL,MOTRIN) 200 MG tablet Take 200 mg by mouth every 6 (six) hours as needed.  . traZODone (DESYREL) 50 MG tablet Take 50 mg by mouth at bedtime. Reported on 10/06/2015  . [DISCONTINUED] acetaminophen (TYLENOL) 325 MG tablet Take 650 mg by mouth every 4 (four) hours as needed. Reported on 10/06/2015  . [DISCONTINUED] alum & mag hydroxide-simeth (GELUSIL) [id number] MG suspension Chew by mouth every 6 (six) hours as needed for indigestion or heartburn. Reported on 10/06/2015  . [DISCONTINUED] Amino Acids-Protein Hydrolys (FEEDING SUPPLEMENT, PRO-STAT SUGAR FREE 64,) LIQD Take 30 mLs by mouth 2 (two) times daily. Reported on 10/06/2015  . [DISCONTINUED] bisacodyl (DULCOLAX) 10 MG suppository Place 10 mg rectally as needed for moderate constipation. Reported on 10/06/2015  . [DISCONTINUED] magnesium hydroxide (MILK OF MAGNESIA) 400 MG/5ML suspension Take 5 mLs by mouth daily as needed for mild constipation. Reported on 10/06/2015   No facility-administered encounter medications on file as of 10/24/2015.    Allergies (verified) Review of patient's allergies indicates no active allergies.   History: Past  Medical History  Diagnosis Date  . Cataract     Surgery scheduled for 03/2014  . Arthritis     both knees   Past Surgical History  Procedure Laterality Date  . Cholecystectomy    . Femur surgery  11/10/2014   Family History  Problem Relation Age of Onset  . Cancer Mother   . Heart disease Father    Social History   Occupational History  . Not on file.   Social History Main Topics  . Smoking status: Never Smoker   . Smokeless tobacco: Not on file  . Alcohol Use: No  . Drug Use: No  . Sexual Activity: Not Currently    Tobacco Counseling Counseling given: Not Answered   Activities of Daily Living In your present state of health, do you have any difficulty performing the following activities: 10/24/2015  Hearing? N  Vision? N  Difficulty concentrating or making decisions? N  Walking or climbing stairs? Y  Dressing or bathing? N  Doing errands, shopping? N  Preparing Food and eating ? N  Using the Toilet? N  In the past six months, have you accidently leaked urine? N  Do you have problems with loss of bowel control? N  Managing your Medications? N  Managing your Finances? N  Housekeeping or managing your Housekeeping? N    Immunizations and Health Maintenance Immunization History  Administered Date(s) Administered  . Influenza,inj,Quad PF,36+ Mos 07/26/2015  . PPD Test 11/18/2014   Health Maintenance Due  Topic Date Due  . TETANUS/TDAP  09/08/1954  . ZOSTAVAX  09/09/1995    Patient Care Team: Rubbie Battiest, NP as PCP - General (Gerontology)  Indicate any recent Medical Services you may have received from other than [facility] providers in the past year (date may be approximate).     Assessment:   This is a routine wellness examination for Elizabeth Fernandez. The goal of the wellness visit is to assist the patient how to close the gaps in care and create a preventative care plan for the patient.   Taking VIT D Calcium as appropriate/Osteoporosis risk reviewed.  DEXA Scan  postponed, per patient request.  Educational material provided.  Medications reviewed; taking without issues or barriers.  Safety issues reviewed; smoke detectors in the home. No firearms in the home. Wears seatbelts when driving or riding with others. No violence in the home.  No identified risk were noted; The patient was oriented x 3; appropriate in dress and manner and no objective failures at ADL's or IADL's.   TDAP and ZOSTAVAX vaccine postponed for follow up with insurance, per patient request.  Prevnar 13/Pneumovax 23 vaccine declined.  Patient Concerns:  None at this time.  Follow up with PCP as needed.  Hearing/Vision screen Hearing Screening Comments: Passes the whisper test Vision Screening Comments: Followed by Dr. Herbert Deaner and Dr. Zigmund Daniel, [facility] injections Bilateral cataracts extracted Wears readers only Visits every 6-8 weeks   Dietary issues and exercise activities discussed: Current Exercise Habits: Home exercise routine, Type of exercise: walking, Time (Minutes): 40, Intensity: Mild  Goals    . Healthy Lifestyle     Maintain exercise regiment. Low carb foods.  Lean meats, fruits and vegetables.    . Increase water intake     Stay hydrated!  Drink plenty of water.      Depression Screen PHQ 2/9 Scores 10/24/2015 07/26/2015  PHQ - 2 Score 0 1    Fall Risk Fall Risk  10/24/2015 07/26/2015  Falls in the past year? Yes Yes  Number falls in past yr: 2 or more 2 or more  Injury with Fall? Yes Yes  Risk for fall due to : Other (Comment);History of fall(s) History of fall(s)  Risk for fall due to (comments): Missed a step when going down stairs.  Broken L femur.  Stable and followed by PCP. -  Follow up Falls prevention discussed;Education provided Falls prevention discussed    Cognitive Function: MMSE - Mini Mental State Exam 10/24/2015  Orientation to time 5  Orientation to Place 5  Registration 3  Attention/ Calculation 5  Recall 3    Language- name 2 objects 2  Language- repeat 1  Language- follow 3 step command 3  Language- read & follow direction 1  Write a sentence 1  Copy design 1  Total score 30    Screening Tests Health Maintenance  Topic Date Due  . TETANUS/TDAP  09/08/1954  . ZOSTAVAX  09/09/1995  . DEXA SCAN  10/23/2016 (Originally 09/08/2000)  . PNA vac Low Risk Adult (1 of 2 - PCV13) 10/23/2016 (Originally 09/08/2000)  . INFLUENZA VACCINE  03/06/2016      Plan:   End of life planning; Advance aging; Advanced directives discussed. Educational material provided to help her start the conversation with family.  Copy requested of completed  HCPOA/Living Will short forms. Time spent discussing this topic 20 minutes.  During the course of the visit, Kristain was educated and counseled about the following appropriate screening and preventive services:   Vaccines to include Pneumoccal, Influenza,  Hepatitis B, Td, Zostavax, HCV  Electrocardiogram  Cardiovascular disease screening  Colorectal cancer screening  Bone density screening  Diabetes screening  Glaucoma screening  Mammography/PAP  Nutrition counseling  Smoking cessation counseling  Patient Instructions (the written plan) were given to the patient.    Varney Biles, LPN   [id number]

## 2015-10-24 NOTE — Progress Notes (Signed)
I have reviewed the note and agree with the information.  Lorane Gell, NP-C  [phone] 10/24/15

## 2015-11-03 ENCOUNTER — Encounter (INDEPENDENT_AMBULATORY_CARE_PROVIDER_SITE_OTHER): Payer: Medicare Other | Admitting: Ophthalmology

## 2015-11-03 DIAGNOSIS — H34811 Central retinal vein occlusion, right eye, with macular edema: Secondary | ICD-10-CM | POA: Diagnosis not present

## 2015-11-03 DIAGNOSIS — I10 Essential (primary) hypertension: Secondary | ICD-10-CM

## 2015-11-03 DIAGNOSIS — H35033 Hypertensive retinopathy, bilateral: Secondary | ICD-10-CM

## 2015-11-03 DIAGNOSIS — H43813 Vitreous degeneration, bilateral: Secondary | ICD-10-CM | POA: Diagnosis not present

## 2015-12-15 ENCOUNTER — Encounter (INDEPENDENT_AMBULATORY_CARE_PROVIDER_SITE_OTHER): Payer: Medicare Other | Admitting: Ophthalmology

## 2015-12-15 DIAGNOSIS — I10 Essential (primary) hypertension: Secondary | ICD-10-CM

## 2015-12-15 DIAGNOSIS — H34811 Central retinal vein occlusion, right eye, with macular edema: Secondary | ICD-10-CM | POA: Diagnosis not present

## 2015-12-15 DIAGNOSIS — H43813 Vitreous degeneration, bilateral: Secondary | ICD-10-CM | POA: Diagnosis not present

## 2015-12-15 DIAGNOSIS — H35033 Hypertensive retinopathy, bilateral: Secondary | ICD-10-CM

## 2016-02-02 ENCOUNTER — Encounter (INDEPENDENT_AMBULATORY_CARE_PROVIDER_SITE_OTHER): Payer: Medicare Other | Admitting: Ophthalmology

## 2016-02-02 DIAGNOSIS — H35033 Hypertensive retinopathy, bilateral: Secondary | ICD-10-CM | POA: Diagnosis not present

## 2016-02-02 DIAGNOSIS — H43813 Vitreous degeneration, bilateral: Secondary | ICD-10-CM | POA: Diagnosis not present

## 2016-02-02 DIAGNOSIS — I10 Essential (primary) hypertension: Secondary | ICD-10-CM

## 2016-02-02 DIAGNOSIS — H34811 Central retinal vein occlusion, right eye, with macular edema: Secondary | ICD-10-CM | POA: Diagnosis not present

## 2016-03-22 ENCOUNTER — Encounter (INDEPENDENT_AMBULATORY_CARE_PROVIDER_SITE_OTHER): Payer: Medicare Other | Admitting: Ophthalmology

## 2016-03-22 DIAGNOSIS — H35033 Hypertensive retinopathy, bilateral: Secondary | ICD-10-CM

## 2016-03-22 DIAGNOSIS — I10 Essential (primary) hypertension: Secondary | ICD-10-CM

## 2016-03-22 DIAGNOSIS — H43813 Vitreous degeneration, bilateral: Secondary | ICD-10-CM

## 2016-03-22 DIAGNOSIS — H34811 Central retinal vein occlusion, right eye, with macular edema: Secondary | ICD-10-CM | POA: Diagnosis not present

## 2016-05-17 ENCOUNTER — Encounter (INDEPENDENT_AMBULATORY_CARE_PROVIDER_SITE_OTHER): Payer: Medicare Other | Admitting: Ophthalmology

## 2016-05-17 DIAGNOSIS — H35033 Hypertensive retinopathy, bilateral: Secondary | ICD-10-CM | POA: Diagnosis not present

## 2016-05-17 DIAGNOSIS — I10 Essential (primary) hypertension: Secondary | ICD-10-CM

## 2016-05-17 DIAGNOSIS — H34811 Central retinal vein occlusion, right eye, with macular edema: Secondary | ICD-10-CM | POA: Diagnosis not present

## 2016-05-17 DIAGNOSIS — H43813 Vitreous degeneration, bilateral: Secondary | ICD-10-CM | POA: Diagnosis not present

## 2016-06-19 ENCOUNTER — Ambulatory Visit (INDEPENDENT_AMBULATORY_CARE_PROVIDER_SITE_OTHER): Payer: Medicare Other

## 2016-06-19 DIAGNOSIS — Z23 Encounter for immunization: Secondary | ICD-10-CM | POA: Diagnosis not present

## 2016-07-19 ENCOUNTER — Encounter (INDEPENDENT_AMBULATORY_CARE_PROVIDER_SITE_OTHER): Payer: Medicare Other | Admitting: Ophthalmology

## 2016-07-19 DIAGNOSIS — H35033 Hypertensive retinopathy, bilateral: Secondary | ICD-10-CM

## 2016-07-19 DIAGNOSIS — I10 Essential (primary) hypertension: Secondary | ICD-10-CM

## 2016-07-19 DIAGNOSIS — H34812 Central retinal vein occlusion, left eye, with macular edema: Secondary | ICD-10-CM

## 2016-07-19 DIAGNOSIS — H43813 Vitreous degeneration, bilateral: Secondary | ICD-10-CM

## 2016-08-17 ENCOUNTER — Other Ambulatory Visit (INDEPENDENT_AMBULATORY_CARE_PROVIDER_SITE_OTHER): Payer: Medicare Other | Admitting: Ophthalmology

## 2016-08-17 DIAGNOSIS — H34811 Central retinal vein occlusion, right eye, with macular edema: Secondary | ICD-10-CM

## 2016-09-13 ENCOUNTER — Encounter (INDEPENDENT_AMBULATORY_CARE_PROVIDER_SITE_OTHER): Payer: Medicare Other | Admitting: Ophthalmology

## 2016-09-13 DIAGNOSIS — H34811 Central retinal vein occlusion, right eye, with macular edema: Secondary | ICD-10-CM | POA: Diagnosis not present

## 2016-09-13 DIAGNOSIS — I10 Essential (primary) hypertension: Secondary | ICD-10-CM | POA: Diagnosis not present

## 2016-09-13 DIAGNOSIS — H35033 Hypertensive retinopathy, bilateral: Secondary | ICD-10-CM

## 2016-09-13 DIAGNOSIS — H43813 Vitreous degeneration, bilateral: Secondary | ICD-10-CM | POA: Diagnosis not present

## 2016-10-23 ENCOUNTER — Ambulatory Visit: Payer: Medicare Other

## 2016-10-24 ENCOUNTER — Ambulatory Visit (INDEPENDENT_AMBULATORY_CARE_PROVIDER_SITE_OTHER): Payer: Medicare Other

## 2016-10-24 VITALS — BP 136/72 | HR 61 | Temp 97.6°F | Resp 14 | Ht 64.0 in | Wt 162.8 lb

## 2016-10-24 DIAGNOSIS — Z23 Encounter for immunization: Secondary | ICD-10-CM

## 2016-10-24 DIAGNOSIS — Z0000 Encounter for general adult medical examination without abnormal findings: Secondary | ICD-10-CM | POA: Diagnosis not present

## 2016-10-24 DIAGNOSIS — E2839 Other primary ovarian failure: Secondary | ICD-10-CM | POA: Diagnosis not present

## 2016-10-24 NOTE — Progress Notes (Addendum)
Subjective:   Elizabeth Fernandez is a 83 y.o. female who presents for Medicare Annual (Subsequent) preventive examination.  Review of Systems:  No ROS.  Medicare Wellness Visit. Cardiac Risk Factors include: advanced age (>8men, >50 women)     Objective:     Vitals: BP 136/72 (BP Location: Left Arm, Patient Position: Sitting, Cuff Size: Normal)   Pulse 61   Temp 97.6 F (36.4 C) (Oral)   Resp 14   Ht 5\' 4"  (1.626 m)   Wt 162 lb 12.8 oz (73.8 kg)   SpO2 96%   BMI 27.94 kg/m   Body mass index is 27.94 kg/m.   Tobacco History  Smoking Status  . Never Smoker  Smokeless Tobacco  . Never Used     Counseling given: Not Answered   Past Medical History:  Diagnosis Date  . Arthritis    both knees  . Cataract    Surgery scheduled for 03/2014   Past Surgical History:  Procedure Laterality Date  . CHOLECYSTECTOMY    . FEMUR SURGERY  11/10/2014   Family History  Problem Relation Age of Onset  . Cancer Mother   . Heart disease Father    History  Sexual Activity  . Sexual activity: Not Currently    Outpatient Encounter Prescriptions as of 10/24/2016  Medication Sig  . calcium-vitamin D (OSCAL WITH D) 500-200 MG-UNIT per tablet Take 1 tablet by mouth 2 (two) times daily. Reported on 10/06/2015  . docusate calcium (SURFAK) 240 MG capsule Take 240 mg by mouth at bedtime. Reported on 10/06/2015  . ibuprofen (ADVIL,MOTRIN) 200 MG tablet Take 200 mg by mouth every 6 (six) hours as needed.  . [DISCONTINUED] traZODone (DESYREL) 50 MG tablet Take 50 mg by mouth at bedtime. Reported on 10/06/2015   No facility-administered encounter medications on file as of 10/24/2016.     Activities of Daily Living In your present state of health, do you have any difficulty performing the following activities: 10/24/2016  Hearing? N  Vision? N  Difficulty concentrating or making decisions? N  Walking or climbing stairs? Y  Dressing or bathing? N  Doing errands, shopping? N  Preparing Food and  eating ? N  Using the Toilet? N  In the past six months, have you accidently leaked urine? N  Do you have problems with loss of bowel control? N  Managing your Medications? N  Managing your Finances? N  Housekeeping or managing your Housekeeping? N  Some recent data might be hidden    Patient Care Team: Burnard Hawthorne, FNP as PCP - General (Family Medicine)    Assessment:    This is a routine wellness examination for Elizabeth Fernandez. The goal of the wellness visit is to assist the patient how to close the gaps in care and create a preventative care plan for the patient.   Taking calcium VIT D as appropriate/Osteoporosis risk reviewed.  Medications reviewed; taking without issues or barriers.  Safety issues reviewed; smoke detectors in the home. No firearms in the home.  Wears seatbelts when driving or riding with others. Patient does wear sunscreen or protective clothing when in direct sunlight. No violence in the home.  Patient is alert, normal appearance, oriented to person/place/and time. Correctly identified the president of the [place], recall of 3/3 words, and performing simple calculations.  Patient displays appropriate judgement and can read correct time from watch face.  No new identified risk were noted.  No failures at ADL's or IADL's.   BMI- discussed  the importance of a healthy diet, water intake and exercise. Educational material provided.   Diet: Breakfast: Toast, fruit Lunch: Peanut butter sandwich Dinner: Pasta  Daily fluid intake: 3 cups of caffeine, 1 cup of water  Dental- every six months. Dr. Olena Heckle  Eye- Visual acuity not assessed per patient preference since they have regular follow up with the ophthalmologist.  Wears corrective lenses.  Sleep patterns- Sleeps 8 hours at night.  Wakes feeling rested.  Prevnar 13 vaccine administered, tolerated well. Educational material provided. Dexa Scan ordered; follow as directed.  Educational material provided. TDAP  vaccine deferred per patient preference.  Follow up with insurance.  Educational material provided.  Patient Concerns: None at this time. Follow up with PCP as needed.  Exercise Activities and Dietary recommendations Current Exercise Habits: Home exercise routine, Type of exercise: walking, Time (Minutes): 35, Frequency (Times/Week): 3, Weekly Exercise (Minutes/Week): 105, Intensity: Mild  Goals    . Increase physical activity    . Increase water intake          Stay hydrated!  Drink plenty of water.      Fall Risk Fall Risk  10/24/2016 10/24/2015 07/26/2015  Falls in the past year? No Yes Yes  Number falls in past yr: - 2 or more 2 or more  Injury with Fall? - Yes Yes  Risk for fall due to : - Other (Comment);History of fall(s) History of fall(s)  Risk for fall due to (comments): - Missed a step when going down stairs.  Broken L femur.  Stable and followed by PCP. -  Follow up - Falls prevention discussed;Education provided Falls prevention discussed   Depression Screen PHQ 2/9 Scores 10/24/2016 10/24/2015 07/26/2015  PHQ - 2 Score 0 0 1     Cognitive Function MMSE - Mini Mental State Exam 10/24/2016 10/24/2015  Orientation to time 5 5  Orientation to Place 5 5  Registration 3 3  Attention/ Calculation 5 5  Recall 3 3  Language- name 2 objects 2 2  Language- repeat 1 1  Language- follow 3 step command 3 3  Language- read & follow direction 1 1  Write a sentence 1 1  Copy design 1 1  Total score 30 30        Immunization History  Administered Date(s) Administered  . Influenza, High Dose Seasonal PF 06/19/2016  . Influenza,inj,Quad PF,36+ Mos 07/26/2015  . PPD Test 11/18/2014  . Pneumococcal Conjugate-13 10/24/2016   Screening Tests Health Maintenance  Topic Date Due  . TETANUS/TDAP  09/08/1954  . DEXA SCAN  09/08/2000  . PNA vac Low Risk Adult (2 of 2 - PPSV23) 10/24/2017  . INFLUENZA VACCINE  Completed      Plan:    End of life planning; Advance aging;  Advanced directives discussed. Copy of current HCPOA requested.    Medicare Attestation I have personally reviewed: The patient's medical and social history Their use of alcohol, tobacco or illicit drugs Their current medications and supplements The patient's functional ability including ADLs,fall risks, home safety risks, cognitive, and hearing and visual impairment Diet and physical activities Evidence for depression   The patient's weight, height, BMI, and visual acuity have been recorded in the chart.  I have made referrals and provided education to the patient based on review of the above and I have provided the patient with a written personalized care plan for preventive services.    During the course of the visit the patient was educated and counseled about the following  appropriate screening and preventive services:   Vaccines to include Pneumoccal, Influenza, Hepatitis B, Td, Zostavax, HCV  Colorectal cancer screening-UTD  Bone density screening-ordered, follow as directed  Glaucoma screening-annual eye exam  Mammography-UTD  Nutrition counseling   Patient Instructions (the written plan) was given to the patient.   Varney Biles, LPN  02/17/9538  Agree with plan. Mable Paris, NP

## 2016-10-24 NOTE — Patient Instructions (Addendum)
  Elizabeth Fernandez , Thank you for taking time to come for your Medicare Wellness Visit. I appreciate your ongoing commitment to your health goals. Please review the following plan we discussed and let me know if I can assist you in the future.   Establish care/follow up with Mable Paris, FNP   These are the goals we discussed: Goals    . Increase physical activity    . Increase water intake          Stay hydrated!  Drink plenty of water.       This is a list of the screening recommended for you and due dates:  Health Maintenance  Topic Date Due  . Tetanus Vaccine  09/08/1954  . DEXA scan (bone density measurement)  09/08/2000  . Pneumonia vaccines (2 of 2 - PPSV23) 10/24/2017  . Flu Shot  Completed    Bone Densitometry Bone densitometry is an imaging test that uses a special X-ray to measure the amount of calcium and other minerals in your bones (bone density). This test is also known as a bone mineral density test or dual-energy X-ray absorptiometry (DXA). The test can measure bone density at your hip and your spine. It is similar to having a regular X-ray. You may have this test to:  Diagnose a condition that causes weak or thin bones (osteoporosis).  Predict your risk of a broken bone (fracture).  Determine how well osteoporosis treatment is working. Tell a health care provider about:  Any allergies you have.  All medicines you are taking, including vitamins, herbs, eye drops, creams, and over-the-counter medicines.  Any problems you or family members have had with anesthetic medicines.  Any blood disorders you have.  Any surgeries you have had.  Any medical conditions you have.  Possibility of pregnancy.  Any other medical test you had within the previous 14 days that used contrast material. What are the risks? Generally, this is a safe procedure. However, problems can occur and may include the following:  This test exposes you to a very small amount of  radiation.  The risks of radiation exposure may be greater to unborn children. What happens before the procedure?  Do not take any calcium supplements for 24 hours before having the test. You can otherwise eat and drink what you usually do.  Take off all metal jewelry, eyeglasses, dental appliances, and any other metal objects. What happens during the procedure?  You may lie on an exam table. There will be an X-ray generator below you and an imaging device above you.  Other devices, such as boxes or braces, may be used to position your body properly for the scan.  You will need to lie still while the machine slowly scans your body.  The images will show up on a computer monitor. What happens after the procedure? You may need more testing at a later time. This information is not intended to replace advice given to you by your health care provider. Make sure you discuss any questions you have with your health care provider. Document Released: 08/14/2004 Document Revised: 12/29/2015 Document Reviewed: 12/31/2013 Elsevier Interactive Patient Education  2017 [employer].

## 2016-10-25 ENCOUNTER — Encounter (INDEPENDENT_AMBULATORY_CARE_PROVIDER_SITE_OTHER): Payer: Medicare Other | Admitting: Ophthalmology

## 2016-10-25 DIAGNOSIS — H35033 Hypertensive retinopathy, bilateral: Secondary | ICD-10-CM | POA: Diagnosis not present

## 2016-10-25 DIAGNOSIS — H43813 Vitreous degeneration, bilateral: Secondary | ICD-10-CM | POA: Diagnosis not present

## 2016-10-25 DIAGNOSIS — I10 Essential (primary) hypertension: Secondary | ICD-10-CM | POA: Diagnosis not present

## 2016-10-25 DIAGNOSIS — H348112 Central retinal vein occlusion, right eye, stable: Secondary | ICD-10-CM

## 2016-11-19 ENCOUNTER — Ambulatory Visit
Admission: RE | Admit: 2016-11-19 | Discharge: 2016-11-19 | Disposition: A | Discharge status: Home / Self Care | Payer: Medicare Other | Source: Ambulatory Visit | Attending: Family | Admitting: Family

## 2016-11-19 ENCOUNTER — Ambulatory Visit (INDEPENDENT_AMBULATORY_CARE_PROVIDER_SITE_OTHER): Payer: Medicare Other | Admitting: Family

## 2016-11-19 ENCOUNTER — Encounter: Payer: Self-pay | Admitting: Family

## 2016-11-19 VITALS — BP 136/76 | HR 84 | Temp 97.7°F | Ht 64.0 in | Wt 160.8 lb

## 2016-11-19 DIAGNOSIS — L989 Disorder of the skin and subcutaneous tissue, unspecified: Secondary | ICD-10-CM | POA: Insufficient documentation

## 2016-11-19 DIAGNOSIS — M7989 Other specified soft tissue disorders: Secondary | ICD-10-CM | POA: Diagnosis not present

## 2016-11-19 DIAGNOSIS — Z780 Asymptomatic menopausal state: Secondary | ICD-10-CM

## 2016-11-19 NOTE — Assessment & Plan Note (Signed)
Trace. Pending duplex. Advised conservative therapy, minimize salt and compression stockings. Patient will let me know if not better.

## 2016-11-19 NOTE — Assessment & Plan Note (Signed)
Pending dexa, vitamin D.

## 2016-11-19 NOTE — Progress Notes (Signed)
Subjective:    Patient ID: Elizabeth Fernandez, female    DOB: Apr 22, 1934, 83 y.o.   MRN: [id number]  CC: Elizabeth Fernandez is a 83 y.o. female who presents today for follow up.   HPI: Overall doing well.  Awaiting dexa scan  Left pedal swelling for couple of weeks, unchanged. Doesn't improve with elevation.  Watches salt.   No chest pain, SOB, h/o DVT, orthopnea.        HISTORY:  Past Medical History:  Diagnosis Date  . Arthritis    both knees  . Cataract    Surgery scheduled for 03/2014   Past Surgical History:  Procedure Laterality Date  . CHOLECYSTECTOMY    . FEMUR SURGERY  11/10/2014   Family History  Problem Relation Age of Onset  . Cancer Mother   . Heart disease Father     Allergies: Patient has no active allergies. Current Outpatient Prescriptions on File Prior to Visit  Medication Sig Dispense Refill  . calcium-vitamin D (OSCAL WITH D) 500-200 MG-UNIT per tablet Take 1 tablet by mouth 2 (two) times daily. Reported on 10/06/2015    . docusate calcium (SURFAK) 240 MG capsule Take 240 mg by mouth at bedtime. Reported on 10/06/2015    . ibuprofen (ADVIL,MOTRIN) 200 MG tablet Take 200 mg by mouth every 6 (six) hours as needed.     No current facility-administered medications on file prior to visit.     Social History  Substance Use Topics  . Smoking status: Never Smoker  . Smokeless tobacco: Never Used  . Alcohol use No    Review of Systems  Constitutional: Negative for chills and fever.  Respiratory: Negative for cough and shortness of breath.   Cardiovascular: Positive for leg swelling. Negative for chest pain and palpitations.  Gastrointestinal: Negative for nausea and vomiting.      Objective:    BP 136/76   Pulse 84   Temp 97.7 F (36.5 C) (Oral)   Ht 5\' 4"  (1.626 m)   Wt 160 lb 12.8 oz (72.9 kg)   SpO2 94%   BMI 27.60 kg/m  BP Readings from Last 3 Encounters:  11/19/16 136/76  10/24/16 136/72  10/24/15 128/62   Wt Readings from Last 3  Encounters:  11/19/16 160 lb 12.8 oz (72.9 kg)  10/24/16 162 lb 12.8 oz (73.8 kg)  10/24/15 146 lb (66.2 kg)    Physical Exam  Constitutional: She appears well-developed and well-nourished.  Eyes: Conjunctivae are normal.  Cardiovascular: Normal rate, regular rhythm, normal heart sounds and normal pulses.   Trace pedal, non pitting,  edema , palpable cords or masses. No erythema or increased warmth. No asymmetry in calf size when compared bilaterally LE hair growth symmetric and present. No discoloration of varicosities noted. LE warm and palpable pedal pulses.   Pulmonary/Chest: Effort normal and breath sounds normal. She has no wheezes. She has no rhonchi. She has no rales.  Neurological: She is alert.  Skin: Skin is warm and dry.  Psychiatric: She has a normal mood and affect. Her speech is normal and behavior is normal. Thought content normal.  Vitals reviewed.      Assessment & Plan:   Problem List Items Addressed This Visit      Other   Leg swelling - Primary    Trace. Pending duplex. Advised conservative therapy, minimize salt and compression stockings. Patient will let me know if not better.       Relevant Orders   CBC with Differential/Platelet  Comprehensive metabolic panel   Lipid panel   US Venous Img Lower Unilateral Left   Postmenopausal estrogen deficiency    Pending dexa, vitamin D.       Relevant Orders   DG Bone Density   VITAMIN D 25 Hydroxy (Vit-D Deficiency, Fractures)       I am having [name] maintain her calcium-vitamin D, docusate calcium, and ibuprofen.   No orders of the defined types were placed in this encounter.   Return precautions given.   Risks, benefits, and alternatives of the medications and treatment plan prescribed today were discussed, and patient expressed understanding.   Education regarding symptom management and diagnosis given to patient on AVS.  Continue to follow with Mable Paris, FNP for routine health  maintenance.   Cassandria Santee and I agreed with plan.   Mable Paris, FNP

## 2016-11-19 NOTE — Patient Instructions (Addendum)
Labs fasting.  Try compression stockings- let me know if not better  [place] of left leg.

## 2016-11-19 NOTE — Progress Notes (Signed)
Pre visit review using our clinic review tool, if applicable. No additional management support is needed unless otherwise documented below in the visit note. 

## 2016-11-19 NOTE — Progress Notes (Signed)
She has been scheduled on 5/24

## 2016-12-03 ENCOUNTER — Telehealth: Payer: Self-pay | Admitting: Family

## 2016-12-03 NOTE — Telephone Encounter (Signed)
Pt called stating she spoke to her insurance company and was told that her insurance needs to be called and the code needs to be given. Before she can get her labs done. Pt insurance is [employer]. Please advise?  Call pt @ [phone]. Thank you!

## 2016-12-03 NOTE — Telephone Encounter (Signed)
Please advise 

## 2016-12-06 ENCOUNTER — Encounter (INDEPENDENT_AMBULATORY_CARE_PROVIDER_SITE_OTHER): Payer: Medicare Other | Admitting: Ophthalmology

## 2016-12-06 DIAGNOSIS — H348112 Central retinal vein occlusion, right eye, stable: Secondary | ICD-10-CM

## 2016-12-06 DIAGNOSIS — H43813 Vitreous degeneration, bilateral: Secondary | ICD-10-CM

## 2016-12-06 DIAGNOSIS — H35033 Hypertensive retinopathy, bilateral: Secondary | ICD-10-CM | POA: Diagnosis not present

## 2016-12-06 DIAGNOSIS — I10 Essential (primary) hypertension: Secondary | ICD-10-CM

## 2016-12-06 NOTE — Telephone Encounter (Signed)
Call pt  She will need to call [employer] and can tell them the associated diagnoses and codes are:  Edema R60.9  Vitamin D deficiency E55.9

## 2016-12-06 NOTE — Telephone Encounter (Signed)
Left message for patient to return call back.  

## 2016-12-06 NOTE — Telephone Encounter (Signed)
Patient was informed. She had no questions, comments, or concerns.

## 2016-12-18 ENCOUNTER — Telehealth: Payer: Self-pay | Admitting: *Deleted

## 2016-12-18 DIAGNOSIS — M545 Low back pain: Principal | ICD-10-CM

## 2016-12-18 DIAGNOSIS — G8929 Other chronic pain: Secondary | ICD-10-CM

## 2016-12-18 NOTE — Telephone Encounter (Signed)
[employer] has requested a procedure code for her blood work panel  Not much information was given  Contact [phone] Ref Ref [phone]

## 2016-12-18 NOTE — Telephone Encounter (Signed)
Which code would be appropriate?

## 2016-12-19 NOTE — Telephone Encounter (Signed)
Mail to pt   Ms Benedict,  Here are the billable codes for your lab tests so you can call [employer].   Vitamin D-- Z78.0  Lipid panel- Z13.6  CMP and CBC - M79.89  Catalina Antigua, NP

## 2016-12-19 NOTE — Telephone Encounter (Signed)
Letter has been mailed.

## 2016-12-25 ENCOUNTER — Ambulatory Visit (INDEPENDENT_AMBULATORY_CARE_PROVIDER_SITE_OTHER): Payer: Medicare Other

## 2016-12-25 ENCOUNTER — Ambulatory Visit (INDEPENDENT_AMBULATORY_CARE_PROVIDER_SITE_OTHER): Payer: Medicare Other | Admitting: Family

## 2016-12-25 ENCOUNTER — Encounter: Payer: Self-pay | Admitting: Family

## 2016-12-25 VITALS — BP 144/88 | HR 77 | Temp 97.6°F | Resp 14 | Ht 64.0 in | Wt 159.0 lb

## 2016-12-25 DIAGNOSIS — M160 Bilateral primary osteoarthritis of hip: Secondary | ICD-10-CM | POA: Diagnosis not present

## 2016-12-25 DIAGNOSIS — M5136 Other intervertebral disc degeneration, lumbar region: Secondary | ICD-10-CM | POA: Diagnosis not present

## 2016-12-25 DIAGNOSIS — Z780 Asymptomatic menopausal state: Secondary | ICD-10-CM

## 2016-12-25 DIAGNOSIS — M545 Low back pain, unspecified: Secondary | ICD-10-CM

## 2016-12-25 DIAGNOSIS — M7989 Other specified soft tissue disorders: Secondary | ICD-10-CM

## 2016-12-25 LAB — COMPREHENSIVE METABOLIC PANEL
ALK PHOS: 114 U/L (ref 39–117)
ALT: 11 U/L (ref 0–35)
AST: 13 U/L (ref 0–37)
Albumin: 4 g/dL (ref 3.5–5.2)
BILIRUBIN TOTAL: 0.5 mg/dL (ref 0.2–1.2)
BUN: 22 mg/dL (ref 6–23)
CALCIUM: 9 mg/dL (ref 8.4–10.5)
CO2: 32 meq/L (ref 19–32)
CREATININE: 0.7 mg/dL (ref 0.40–1.20)
Chloride: 105 mEq/L (ref 96–112)
GFR: 85.3 mL/min (ref >60.00)
Glucose, Bld: 89 mg/dL (ref 70–99)
Potassium: 4.4 mEq/L (ref 3.5–5.1)
Sodium: 142 mEq/L (ref 135–145)
TOTAL PROTEIN: 6.8 g/dL (ref 6.0–8.3)

## 2016-12-25 LAB — LIPID PANEL
CHOL/HDL RATIO: 3
Cholesterol: 199 mg/dL (ref 0–200)
HDL: 71.4 mg/dL (ref >39.00)
LDL Cholesterol: 117 mg/dL — ABNORMAL HIGH (ref 0–99)
NONHDL: 127.39
TRIGLYCERIDES: 54 mg/dL (ref 0.0–149.0)
VLDL: 10.8 mg/dL (ref 0.0–40.0)

## 2016-12-25 LAB — CBC WITH DIFFERENTIAL/PLATELET
Basophils Absolute: 0.1 10*3/uL (ref 0.0–0.1)
Basophils Relative: 1 % (ref 0.0–3.0)
EOS ABS: 0.5 10*3/uL (ref 0.0–0.7)
Eosinophils Relative: 8.3 % — ABNORMAL HIGH (ref 0.0–5.0)
HEMATOCRIT: 36.6 % (ref 36.0–46.0)
HEMOGLOBIN: 12.3 g/dL (ref 12.0–15.0)
LYMPHS PCT: 18.6 % (ref 12.0–46.0)
Lymphs Abs: 1.2 10*3/uL (ref 0.7–4.0)
MCHC: 33.6 g/dL (ref 30.0–36.0)
MCV: 89.7 fl (ref 78.0–100.0)
MONOS PCT: 6.4 % (ref 3.0–12.0)
Monocytes Absolute: 0.4 10*3/uL (ref 0.1–1.0)
NEUTROS ABS: 4.2 10*3/uL (ref 1.4–7.7)
Neutrophils Relative %: 65.7 % (ref 43.0–77.0)
PLATELETS: 214 10*3/uL (ref 150.0–400.0)
RBC: 4.08 Mil/uL (ref 3.87–5.11)
RDW: 12.8 % (ref 11.5–15.5)
WBC: 6.5 10*3/uL (ref 4.0–10.5)

## 2016-12-25 MED ORDER — MELOXICAM 7.5 MG PO TABS: 7.5000 mg | ORAL_TABLET | Freq: Every day | ORAL | 1 refills | Status: DC

## 2016-12-25 NOTE — Patient Instructions (Addendum)
Spot check blood pressure couple times per week and let me know if persistently greater than 135/85.  Trial of meloxicam  Labs today  Xrays today  Let me know if pain doesn't  Improve with excercises, heat..   Sacroiliac Joint Dysfunction Sacroiliac joint dysfunction is a condition that causes inflammation on one or both sides of the sacroiliac (SI) joint. The SI joint connects the lower part of the spine (sacrum) with the two upper portions of the pelvis (ilium). This condition causes deep aching or burning pain in the low back. In some cases, the pain may also spread into one or both buttocks or hips or spread down the legs. What are the causes? This condition may be caused by:  Pregnancy. During pregnancy, extra stress is put on the SI joints because the pelvis widens.  Injury, such as:  Car accidents.  Sport-related injuries.  Work-related injuries.  Having one leg that is shorter than the other.  Conditions that affect the joints, such as:  Rheumatoid arthritis.  Gout.  Psoriatic arthritis.  Joint infection (septic arthritis). Sometimes, the cause of SI joint dysfunction is not known. What are the signs or symptoms? Symptoms of this condition include:  Aching or burning pain in the lower back. The pain may also spread to other areas, such as:  Buttocks.  Groin.  Thighs and legs.  Muscle spasms in or around the painful areas.  Increased pain when standing, walking, running, stair climbing, bending, or lifting. How is this diagnosed? Your health care provider will do a physical exam and take your medical history. During the exam, the health care provider may move one or both of your legs to different positions to check for pain. Various tests may be done to help verify the diagnosis, including:  Imaging tests to look for other causes of pain. These may include:  MRI.  CT scan.  Bone scan.  Diagnostic injection. A numbing medicine is injected into the  SI joint using a needle. If the pain is temporarily improved or stopped after the injection, this can indicate that SI joint dysfunction is the problem. How is this treated? Treatment may vary depending on the cause and severity of your condition. Treatment options may include:  Applying ice or heat to the lower back area. This can help to reduce pain and muscle spasms.  Medicines to relieve pain or inflammation or to relax the muscles.  Wearing a back brace (sacroiliac brace) to help support the joint while your back is healing.  Physical therapy to increase muscle strength around the joint and flexibility at the joint. This may also involve learning proper body positions and ways of moving to relieve stress on the joint.  Direct manipulation of the SI joint.  Injections of steroid medicine into the joint in order to reduce pain and swelling.  Radiofrequency ablation to burn away nerves that are carrying pain messages from the joint.  Use of a device that provides electrical stimulation in order to reduce pain at the joint.  Surgery to put in screws and plates that limit or prevent joint motion. This is rare. Follow these instructions at home:  Rest as needed. Limit your activities as directed by your health care provider.  Take medicines only as directed by your health care provider.  If directed, apply ice to the affected area:  Put ice in a plastic bag.  Place a towel between your skin and the bag.  Leave the ice on for 20 minutes, 2-3  times per day.  Use a heating pad or a moist heat pack as directed by your health care provider.  Exercise as directed by your health care provider or physical therapist.  Keep all follow-up visits as directed by your health care provider. This is important. Contact a health care provider if:  Your pain is not controlled with medicine.  You have a fever.  You have increasingly severe pain. Get help right away if:  You have weakness,  numbness, or tingling in your legs or feet.  You lose control of your bladder or bowel. This information is not intended to replace advice given to you by your health care provider. Make sure you discuss any questions you have with your health care provider. Document Released: 10/19/2008 Document Revised: 12/29/2015 Document Reviewed: 03/30/2014 Elsevier Interactive Patient Education  2017 [employer].

## 2016-12-25 NOTE — Progress Notes (Signed)
Subjective:    Patient ID: Elizabeth Fernandez, female    DOB: 1934/06/04, 83 y.o.   MRN: [id number]  CC: Elizabeth Fernandez is a 83 y.o. female who presents today for an acute visit.    HPI: CC: low back pain 2 weeks ago, worsening. Slipped and fell on wet grass and fell on bottom. Didn't hit head and no LOC.  No pain immediately and then pain started on right side of coccyx and down right leg. Has been walking with cane and using ibuprofen with no relief. Pain when stands solely on right leg. No numbness, tingling in legs. Notes even prior to fall, was favoring her right side when walking. No balance problems, or leg weakness.   H/o left hip fracture.   Elevated BP. No h/o htn. Uses discretion with salt. Denies exertional chest pain or pressure, numbness or tingling radiating to left arm or jaw, palpitations, dizziness, frequent headaches, changes in vision, or shortness of breath.      no ckd. No h/o gib  HISTORY:  Past Medical History:  Diagnosis Date  . Arthritis    both knees  . Cataract    Surgery scheduled for 03/2014   Past Surgical History:  Procedure Laterality Date  . CHOLECYSTECTOMY    . FEMUR SURGERY  11/10/2014   Family History  Problem Relation Age of Onset  . Cancer Mother   . Heart disease Father     Allergies: Patient has no active allergies. Current Outpatient Prescriptions on File Prior to Visit  Medication Sig Dispense Refill  . calcium-vitamin D (OSCAL WITH D) 500-200 MG-UNIT per tablet Take 1 tablet by mouth 2 (two) times daily. Reported on 10/06/2015    . docusate calcium (SURFAK) 240 MG capsule Take 240 mg by mouth at bedtime. Reported on 10/06/2015    . ibuprofen (ADVIL,MOTRIN) 200 MG tablet Take 200 mg by mouth every 6 (six) hours as needed.     No current facility-administered medications on file prior to visit.     Social History  Substance Use Topics  . Smoking status: Never Smoker  . Smokeless tobacco: Never Used  . Alcohol use No    Review of  Systems  Constitutional: Negative for chills and fever.  Eyes: Negative for visual disturbance.  Respiratory: Negative for cough.   Cardiovascular: Negative for chest pain and palpitations.  Gastrointestinal: Negative for nausea and vomiting.  Genitourinary: Negative for dysuria.  Musculoskeletal: Positive for back pain and gait problem.  Neurological: Negative for dizziness, weakness, numbness and headaches.      Objective:    BP (!) 144/88   Pulse 77   Temp 97.6 F (36.4 C) (Oral)   Resp 14   Ht 5\' 4"  (1.626 m)   Wt 159 lb (72.1 kg)   SpO2 96%   BMI 27.29 kg/m  BP Readings from Last 3 Encounters:  12/25/16 (!) 144/88  11/19/16 136/76  10/24/16 136/72     Physical Exam  Constitutional: She appears well-developed and well-nourished.  Eyes: Conjunctivae are normal.  Cardiovascular: Normal rate, regular rhythm, normal heart sounds and normal pulses.   Pulmonary/Chest: Effort normal and breath sounds normal. She has no wheezes. She has no rhonchi. She has no rales.  Musculoskeletal:       Lumbar back: She exhibits normal range of motion, no tenderness, no bony tenderness, no swelling, no edema, no pain and no spasm.       Back:  Full range of motion with flexion, tension, lateral  side bends. No bony tenderness. No pain, numbness, tingling elicited with single leg raise bilaterally.  Tenderness noted over right SI joint.   Right Hip: limp noted. Full ROM with flexion and hip rotation in flexion.  No pain of lateral hip with  (flexion-abduction-external rotation) test. No pain with deep palpation of greater trochanter.    Neurological: She is alert. She has normal strength. No sensory deficit.  Reflex Scores:      Patellar reflexes are 2+ on the right side and 2+ on the left side. Sensation and strength intact bilateral lower extremities.  Skin: Skin is warm and dry.  Psychiatric: She has a normal mood and affect. Her speech is normal and behavior is normal. Thought  content normal.  Vitals reviewed.      Assessment & Plan:   1. Acute right-sided low back pain without sciatica Symptoms most consistent with SI joint dysfunction. Also suspect the patient is favoring the right side. Pending x-rays. Trial of meloxicam. We discussed physical therapy for gait training however patient  declines at this time and would like to try home exercises first which were given to patient. Note: advised patient to monitor BP at home. Suspect pain contributory as no h/o HTN  - DG HIPS BILAT WITH PELVIS 2V - DG Lumbar Spine Complete - meloxicam (MOBIC) 7.5 MG tablet; Take 1 tablet (7.5 mg total) by mouth daily. Take with food.  Dispense: 90 tablet; Refill: 1    I am having Ms. Westervelt start on meloxicam. I am also having her maintain her calcium-vitamin D, docusate calcium, and ibuprofen.   Meds ordered this encounter  Medications  . meloxicam (MOBIC) 7.5 MG tablet    Sig: Take 1 tablet (7.5 mg total) by mouth daily. Take with food.    Dispense:  90 tablet    Refill:  1    Order Specific Question:   Supervising Provider    Answer:   Crecencio Mc [[id number]]    Return precautions given.   Risks, benefits, and alternatives of the medications and treatment plan prescribed today were discussed, and patient expressed understanding.   Education regarding symptom management and diagnosis given to patient on AVS.  Continue to follow with Burnard Hawthorne, FNP for routine health maintenance.   Cassandria Santee and I agreed with plan.   Mable Paris, FNP

## 2016-12-26 ENCOUNTER — Other Ambulatory Visit: Payer: Self-pay | Admitting: Family

## 2016-12-26 DIAGNOSIS — M545 Low back pain: Secondary | ICD-10-CM

## 2016-12-26 LAB — VITAMIN D 25 HYDROXY (VIT D DEFICIENCY, FRACTURES): VITD: 26.41 ng/mL — ABNORMAL LOW (ref 30.00–100.00)

## 2016-12-27 ENCOUNTER — Ambulatory Visit
Admission: RE | Admit: 2016-12-27 | Discharge: 2016-12-27 | Disposition: A | Discharge status: Home / Self Care | Payer: Medicare Other | Source: Ambulatory Visit | Attending: Family | Admitting: Family

## 2016-12-27 ENCOUNTER — Encounter: Payer: Self-pay | Admitting: *Deleted

## 2016-12-27 DIAGNOSIS — M810 Age-related osteoporosis without current pathological fracture: Secondary | ICD-10-CM | POA: Insufficient documentation

## 2016-12-27 DIAGNOSIS — Z780 Asymptomatic menopausal state: Secondary | ICD-10-CM | POA: Diagnosis present

## 2016-12-27 DIAGNOSIS — M419 Scoliosis, unspecified: Secondary | ICD-10-CM | POA: Diagnosis not present

## 2016-12-28 ENCOUNTER — Other Ambulatory Visit: Payer: Self-pay | Admitting: Internal Medicine

## 2016-12-28 MED ORDER — TRAMADOL HCL 50 MG PO TABS: 50.0000 mg | ORAL_TABLET | Freq: Four times a day (QID) | ORAL | 0 refills | Status: DC | PRN

## 2017-01-01 ENCOUNTER — Encounter: Payer: Self-pay | Admitting: Family

## 2017-01-01 DIAGNOSIS — M810 Age-related osteoporosis without current pathological fracture: Secondary | ICD-10-CM | POA: Insufficient documentation

## 2017-01-02 NOTE — Telephone Encounter (Signed)
Please advise 

## 2017-01-02 NOTE — Telephone Encounter (Signed)
Left message for patient to return call back.  

## 2017-01-02 NOTE — Telephone Encounter (Signed)
Pt left vm stating that she is scheduled with Dr. Sharlet Salina on 8/7. She wants to know if she needs to continue her home exercises  And pain medicine until then. Pt cb [phone]

## 2017-01-02 NOTE — Telephone Encounter (Signed)
Call pt  I would advise mobic if helping.   Home excercises okay as long as not having worsened pan  Heat.

## 2017-01-04 NOTE — Telephone Encounter (Signed)
Patient was notified.  Patient would also like a referral to physical therapy. Please advise.

## 2017-01-07 NOTE — Telephone Encounter (Signed)
Referral placed.

## 2017-01-15 ENCOUNTER — Ambulatory Visit (INDEPENDENT_AMBULATORY_CARE_PROVIDER_SITE_OTHER): Payer: Medicare Other | Admitting: Family

## 2017-01-15 ENCOUNTER — Encounter: Payer: Self-pay | Admitting: Family

## 2017-01-15 VITALS — BP 135/65 | HR 81 | Temp 97.3°F | Ht 64.0 in | Wt 154.2 lb

## 2017-01-15 DIAGNOSIS — M545 Low back pain, unspecified: Secondary | ICD-10-CM | POA: Insufficient documentation

## 2017-01-15 MED ORDER — PREDNISONE 10 MG PO TABS: ORAL_TABLET | ORAL | 0 refills | Status: DC

## 2017-01-15 MED ORDER — HYDROCODONE-ACETAMINOPHEN 5-325 MG PO TABS: 1.0000 | ORAL_TABLET | Freq: Three times a day (TID) | ORAL | 0 refills | Status: DC | PRN

## 2017-01-15 NOTE — Patient Instructions (Addendum)
Stop tramadol  Start norco every 8 hours, take ibuprofen at same time every 8 hours  Take colace ( stool softener) to prevent constipation  Start prednisone taper.    Await on call from regarding Dr Sharlet Salina appointment  Let me know how you are

## 2017-01-15 NOTE — Progress Notes (Signed)
Pre visit review using our clinic review tool, if applicable. No additional management support is needed unless otherwise documented below in the visit note. 

## 2017-01-15 NOTE — Progress Notes (Signed)
Subjective:    Patient ID: Elizabeth Fernandez, female    DOB: 04-27-1934, 83 y.o.   MRN: [id number]  CC: Elizabeth Fernandez is a 83 y.o. female who presents today for follow up.   HPI: Last seen 5/22 for back pain; here today for re evaluation   Pain is more left sided, worsening. Feels like legs and shins ache. Not sleeping well due to pain.  Heating pad and sleeping flat helps, however didn't help last night. No numbness, tingling, dysuria, N, V, F. No  Groin pain.   Failed ibuprofen, mobic, tramadol with no relief.   Blood pressure elevated. Denies exertional chest pain or pressure, numbness or tingling radiating to left arm or jaw, palpitations, dizziness, frequent headaches, changes in vision, or shortness of breath.   No h/o cancer  H/o left femur fracture.  referred to PT Scheduled with chasnis 8/7. Hip and lumbar xrays 12/2016 showed no acute abnormalities    HISTORY:  Past Medical History:  Diagnosis Date  . Arthritis    both knees  . Cataract    Surgery scheduled for 03/2014   Past Surgical History:  Procedure Laterality Date  . CHOLECYSTECTOMY    . FEMUR SURGERY  11/10/2014   Family History  Problem Relation Age of Onset  . Cancer Mother   . Heart disease Father     Allergies: Pollen extract Current Outpatient Prescriptions on File Prior to Visit  Medication Sig Dispense Refill  . calcium-vitamin D (OSCAL WITH D) 500-200 MG-UNIT per tablet Take 1 tablet by mouth 2 (two) times daily. Reported on 10/06/2015    . docusate calcium (SURFAK) 240 MG capsule Take 240 mg by mouth at bedtime. Reported on 10/06/2015    . ibuprofen (ADVIL,MOTRIN) 200 MG tablet Take 200 mg by mouth every 6 (six) hours as needed.    . meloxicam (MOBIC) 7.5 MG tablet Take 1 tablet (7.5 mg total) by mouth daily. Take with food. 90 tablet 1  . traMADol (ULTRAM) 50 MG tablet Take 1 tablet (50 mg total) by mouth every 6 (six) hours as needed. 90 tablet 0   No current facility-administered medications on  file prior to visit.     Social History  Substance Use Topics  . Smoking status: Never Smoker  . Smokeless tobacco: Never Used  . Alcohol use No    Review of Systems  Constitutional: Negative for chills and fever.  Respiratory: Negative for cough.   Cardiovascular: Negative for chest pain and palpitations.  Gastrointestinal: Negative for abdominal pain, nausea and vomiting.  Genitourinary: Positive for frequency. Negative for dysuria.  Musculoskeletal: Positive for back pain and gait problem. Negative for joint swelling.  Neurological: Negative for weakness and numbness.      Objective:    BP 135/65   Pulse 81   Temp 97.3 F (36.3 C) (Oral)   Ht 5\' 4"  (1.626 m)   Wt 154 lb 3.2 oz (69.9 kg)   SpO2 96%   BMI 26.47 kg/m  BP Readings from Last 3 Encounters:  01/15/17 135/65  12/25/16 (!) 144/88  11/19/16 136/76   Wt Readings from Last 3 Encounters:  01/15/17 154 lb 3.2 oz (69.9 kg)  12/25/16 159 lb (72.1 kg)  11/19/16 160 lb 12.8 oz (72.9 kg)    Physical Exam  Constitutional: She appears well-developed and well-nourished.  Eyes: Conjunctivae are normal.  Cardiovascular: Normal rate, regular rhythm, normal heart sounds and normal pulses.   Pulmonary/Chest: Effort normal and breath sounds normal. She has  no wheezes. She has no rhonchi. She has no rales.  Musculoskeletal:       Lumbar back: She exhibits normal range of motion, no tenderness, no bony tenderness, no swelling, no edema, no pain and no spasm.       Left upper leg: She exhibits no tenderness and no bony tenderness.  Full range of motion with flexion, tension, lateral side bends. No bony tenderness. No pain, numbness, tingling elicited with single leg raise bilaterally.  Pain over left SI joint with palpation. No pain over left trochanter.  Limping gait, able to weight bear on both legs equally.   No pain with deep palpation of trochanter and bilateral femurs.  Neurological: She is alert. She has normal  strength. No sensory deficit.  Reflex Scores:      Patellar reflexes are 2+ on the right side and 2+ on the left side. Sensation and strength intact bilateral lower extremities.  Skin: Skin is warm and dry.  Psychiatric: She has a normal mood and affect. Her speech is normal and behavior is normal. Thought content normal.  Vitals reviewed.      Assessment & Plan:   Problem List Items Addressed This Visit      Other   Acute left-sided low back pain without sciatica - Primary    Acute pain, worsening. Tramadol hasn't helped. Discussed additional xrays of left femur and patient declined as able to weight bear. No pain over femur during exam. Will give short course of prednisone and 5 days worth of narcotics. Risks of both medication explained.  Awaiting an appointment with Dr Sharlet Salina ( or Dr Mack Guise if sooner) . Advised to wait on PT until can be seen by ortho, psychiatry. I looked up patient on [place] Controlled Substances Reporting System and saw no activity that raised concern of inappropriate use.        Relevant Medications   HYDROcodone-acetaminophen (NORCO/VICODIN) 5-325 MG tablet   predniSONE (DELTASONE) 10 MG tablet   Other Relevant Orders   Ambulatory referral to Orthopedic Surgery       I am having Elizabeth Fernandez start on HYDROcodone-acetaminophen and predniSONE. I am also having her maintain her calcium-vitamin D, docusate calcium, ibuprofen, meloxicam, and traMADol.   Meds ordered this encounter  Medications  . HYDROcodone-acetaminophen (NORCO/VICODIN) 5-325 MG tablet    Sig: Take 1 tablet by mouth every 8 (eight) hours as needed for moderate pain.    Dispense:  15 tablet    Refill:  0    Order Specific Question:   Supervising Provider    Answer:   Deborra Medina L [[id number]]  . predniSONE (DELTASONE) 10 MG tablet    Sig: Take 4 tablets ( total 40 mg) by mouth for 2 days; take 3 tablets ( total 30 mg) by mouth for 2 days; take 2 tablets ( total 20 mg) by mouth for 1 day; take  1 tablet ( total 10 mg) by mouth for 1 day.    Dispense:  17 tablet    Refill:  0    Order Specific Question:   Supervising Provider    Answer:   Crecencio Mc [[id number]]    Return precautions given.   Risks, benefits, and alternatives of the medications and treatment plan prescribed today were discussed, and patient expressed understanding.   Education regarding symptom management and diagnosis given to patient on AVS.  Continue to follow with Burnard Hawthorne, FNP for routine health maintenance.   Cassandria Santee and I agreed with  plan.   Margaret Arnett, FNP   

## 2017-01-16 ENCOUNTER — Ambulatory Visit: Payer: Medicare Other

## 2017-01-16 NOTE — Progress Notes (Signed)
spoken to patient, she is still feeling the same.  Pain is the same. Patient has not heard from Dr. Margette Fast. Patient will call tomorrow to let us know how she is feeling.

## 2017-01-16 NOTE — Assessment & Plan Note (Addendum)
Acute pain, worsening. Tramadol hasn't helped. Discussed additional xrays of left femur and patient declined as able to weight bear. No pain over femur during exam. Will give short course of prednisone and 5 days worth of narcotics. Risks of both medication explained.  Awaiting an appointment with Dr Sharlet Salina ( or Dr Mack Guise if sooner) . Advised to wait on PT until can be seen by ortho, psychiatry. I looked up patient on [place] Controlled Substances Reporting System and saw no activity that raised concern of inappropriate use.  Close followup.

## 2017-01-17 ENCOUNTER — Encounter (INDEPENDENT_AMBULATORY_CARE_PROVIDER_SITE_OTHER): Payer: Medicare Other | Admitting: Ophthalmology

## 2017-01-18 ENCOUNTER — Telehealth: Payer: Self-pay | Admitting: Family

## 2017-01-18 DIAGNOSIS — M545 Low back pain, unspecified: Secondary | ICD-10-CM

## 2017-01-18 NOTE — Telephone Encounter (Signed)
Pt is requesting to have her HYDROcodone-acetaminophen (NORCO/VICODIN) 5-325 MG tablet, refilled.

## 2017-01-18 NOTE — Telephone Encounter (Signed)
Refill request for [facility], last seen [id number], last filled [id number].  Please advise.

## 2017-01-21 ENCOUNTER — Encounter: Payer: Self-pay | Admitting: Emergency Medicine

## 2017-01-21 ENCOUNTER — Emergency Department: Payer: Medicare Other

## 2017-01-21 ENCOUNTER — Observation Stay
Admission: EM | Admit: 2017-01-21 | Discharge: 2017-01-25 | Disposition: A | Discharge status: Skilled Nursing Facility | Payer: Medicare Other | Attending: Internal Medicine | Admitting: Internal Medicine

## 2017-01-21 ENCOUNTER — Observation Stay: Payer: Medicare Other

## 2017-01-21 DIAGNOSIS — M129 Arthropathy, unspecified: Secondary | ICD-10-CM | POA: Diagnosis not present

## 2017-01-21 DIAGNOSIS — X58XXXA Exposure to other specified factors, initial encounter: Secondary | ICD-10-CM | POA: Diagnosis not present

## 2017-01-21 DIAGNOSIS — M5410 Radiculopathy, site unspecified: Secondary | ICD-10-CM

## 2017-01-21 DIAGNOSIS — R52 Pain, unspecified: Secondary | ICD-10-CM

## 2017-01-21 DIAGNOSIS — M79606 Pain in leg, unspecified: Secondary | ICD-10-CM | POA: Diagnosis not present

## 2017-01-21 DIAGNOSIS — Z79899 Other long term (current) drug therapy: Secondary | ICD-10-CM | POA: Insufficient documentation

## 2017-01-21 DIAGNOSIS — E876 Hypokalemia: Secondary | ICD-10-CM | POA: Insufficient documentation

## 2017-01-21 DIAGNOSIS — S3210XA Unspecified fracture of sacrum, initial encounter for closed fracture: Secondary | ICD-10-CM | POA: Diagnosis not present

## 2017-01-21 DIAGNOSIS — M25551 Pain in right hip: Secondary | ICD-10-CM | POA: Diagnosis not present

## 2017-01-21 DIAGNOSIS — M545 Low back pain, unspecified: Secondary | ICD-10-CM

## 2017-01-21 DIAGNOSIS — M5116 Intervertebral disc disorders with radiculopathy, lumbar region: Secondary | ICD-10-CM | POA: Diagnosis not present

## 2017-01-21 DIAGNOSIS — M549 Dorsalgia, unspecified: Secondary | ICD-10-CM

## 2017-01-21 DIAGNOSIS — M25552 Pain in left hip: Secondary | ICD-10-CM | POA: Diagnosis not present

## 2017-01-21 DIAGNOSIS — M48061 Spinal stenosis, lumbar region without neurogenic claudication: Secondary | ICD-10-CM | POA: Diagnosis not present

## 2017-01-21 DIAGNOSIS — M810 Age-related osteoporosis without current pathological fracture: Secondary | ICD-10-CM | POA: Insufficient documentation

## 2017-01-21 DIAGNOSIS — R102 Pelvic and perineal pain: Secondary | ICD-10-CM | POA: Diagnosis present

## 2017-01-21 DIAGNOSIS — Z791 Long term (current) use of non-steroidal anti-inflammatories (NSAID): Secondary | ICD-10-CM | POA: Insufficient documentation

## 2017-01-21 DIAGNOSIS — M79605 Pain in left leg: Secondary | ICD-10-CM | POA: Diagnosis not present

## 2017-01-21 DIAGNOSIS — M5126 Other intervertebral disc displacement, lumbar region: Secondary | ICD-10-CM | POA: Diagnosis not present

## 2017-01-21 DIAGNOSIS — S3993XA Unspecified injury of pelvis, initial encounter: Secondary | ICD-10-CM | POA: Diagnosis not present

## 2017-01-21 LAB — BASIC METABOLIC PANEL
ANION GAP: 8 (ref 5–15)
BUN: 20 mg/dL (ref 6–20)
CALCIUM: 8.9 mg/dL (ref 8.9–10.3)
CO2: 31 mmol/L (ref 22–32)
Chloride: 99 mmol/L — ABNORMAL LOW (ref 101–111)
Creatinine, Ser: 0.73 mg/dL (ref 0.44–1.00)
Glucose, Bld: 88 mg/dL (ref 65–99)
Potassium: 3.3 mmol/L — ABNORMAL LOW (ref 3.5–5.1)
SODIUM: 138 mmol/L (ref 135–145)

## 2017-01-21 LAB — URINALYSIS, COMPLETE (UACMP) WITH MICROSCOPIC
BACTERIA UA: NONE SEEN
BILIRUBIN URINE: NEGATIVE
Glucose, UA: NEGATIVE mg/dL
HGB URINE DIPSTICK: NEGATIVE
Ketones, ur: 5 mg/dL — AB
Leukocytes, UA: NEGATIVE
NITRITE: NEGATIVE
PROTEIN: NEGATIVE mg/dL
Specific Gravity, Urine: 1.013 (ref 1.005–1.030)
Squamous Epithelial / LPF: NONE SEEN
pH: 7 (ref 5.0–8.0)

## 2017-01-21 LAB — CBC
HEMATOCRIT: 40.5 % (ref 35.0–47.0)
Hemoglobin: 13.8 g/dL (ref 12.0–16.0)
MCH: 30.5 pg (ref 26.0–34.0)
MCHC: 34.1 g/dL (ref 32.0–36.0)
MCV: 89.3 fL (ref 80.0–100.0)
Platelets: 286 10*3/uL (ref 150–440)
RBC: 4.54 MIL/uL (ref 3.80–5.20)
RDW: 12.8 % (ref 11.5–14.5)
WBC: 12.6 10*3/uL — AB (ref 3.6–11.0)

## 2017-01-21 MED ORDER — CALCIUM CARBONATE-VITAMIN D 500-200 MG-UNIT PO TABS
1.0000 | ORAL_TABLET | Freq: Two times a day (BID) | ORAL | Status: DC
  Administered 2017-01-21 – 2017-01-25 (×8): 1 via ORAL
  Filled 2017-01-21 (×8): qty 1

## 2017-01-21 MED ORDER — ONDANSETRON HCL 4 MG/2ML IJ SOLN
4.0000 mg | Freq: Once | INTRAMUSCULAR | Status: AC
  Administered 2017-01-21: 4 mg via INTRAVENOUS
  Filled 2017-01-21: qty 2

## 2017-01-21 MED ORDER — MORPHINE SULFATE (PF) 4 MG/ML IV SOLN
INTRAVENOUS | Status: AC
  Filled 2017-01-21: qty 1

## 2017-01-21 MED ORDER — DOCUSATE SODIUM 100 MG PO CAPS
100.0000 mg | ORAL_CAPSULE | Freq: Every day | ORAL | Status: DC
  Administered 2017-01-21 – 2017-01-24 (×4): 100 mg via ORAL
  Filled 2017-01-21 (×4): qty 1

## 2017-01-21 MED ORDER — TRAMADOL HCL 50 MG PO TABS
50.0000 mg | ORAL_TABLET | Freq: Four times a day (QID) | ORAL | Status: DC | PRN
Start: 2017-01-21 — End: 2017-01-22
  Administered 2017-01-21 – 2017-01-22 (×3): 50 mg via ORAL
  Filled 2017-01-21 (×3): qty 1

## 2017-01-21 MED ORDER — MORPHINE SULFATE (PF) 2 MG/ML IV SOLN
2.0000 mg | Freq: Once | INTRAVENOUS | Status: AC
  Administered 2017-01-21: 2 mg via INTRAVENOUS
  Filled 2017-01-21: qty 1

## 2017-01-21 MED ORDER — DOCUSATE SODIUM 100 MG PO CAPS: 100.0000 mg | ORAL_CAPSULE | Freq: Every day | ORAL | Status: DC

## 2017-01-21 MED ORDER — MELOXICAM 7.5 MG PO TABS
7.5000 mg | ORAL_TABLET | Freq: Every day | ORAL | Status: DC
  Administered 2017-01-21 – 2017-01-25 (×5): 7.5 mg via ORAL
  Filled 2017-01-21 (×5): qty 1

## 2017-01-21 MED ORDER — IBUPROFEN 400 MG PO TABS: 200.0000 mg | ORAL_TABLET | Freq: Four times a day (QID) | ORAL | Status: DC | PRN

## 2017-01-21 MED ORDER — OXYCODONE-ACETAMINOPHEN 5-325 MG PO TABS
1.0000 | ORAL_TABLET | ORAL | Status: DC | PRN
  Administered 2017-01-21 – 2017-01-22 (×2): 1 via ORAL
  Filled 2017-01-21 (×2): qty 1

## 2017-01-21 MED ORDER — ONDANSETRON HCL 4 MG/2ML IJ SOLN
INTRAMUSCULAR | Status: AC
  Filled 2017-01-21: qty 2

## 2017-01-21 MED ORDER — ONDANSETRON HCL 4 MG/2ML IJ SOLN
4.0000 mg | Freq: Once | INTRAMUSCULAR | Status: AC
  Administered 2017-01-21: 4 mg via INTRAVENOUS

## 2017-01-21 MED ORDER — HYDROCODONE-ACETAMINOPHEN 5-325 MG PO TABS: 1.0000 | ORAL_TABLET | Freq: Three times a day (TID) | ORAL | 0 refills | Status: DC | PRN

## 2017-01-21 MED ORDER — HEPARIN SODIUM (PORCINE) 5000 UNIT/ML IJ SOLN
5000.0000 [IU] | Freq: Three times a day (TID) | INTRAMUSCULAR | Status: DC
  Administered 2017-01-21 – 2017-01-23 (×5): 5000 [IU] via SUBCUTANEOUS
  Filled 2017-01-21 (×5): qty 1

## 2017-01-21 MED ORDER — HYDROCODONE-ACETAMINOPHEN 5-325 MG PO TABS
1.0000 | ORAL_TABLET | Freq: Three times a day (TID) | ORAL | Status: DC | PRN
  Administered 2017-01-23 – 2017-01-24 (×3): 1 via ORAL
  Filled 2017-01-21 (×3): qty 1

## 2017-01-21 MED ORDER — POTASSIUM CHLORIDE CRYS ER 20 MEQ PO TBCR
20.0000 meq | EXTENDED_RELEASE_TABLET | Freq: Two times a day (BID) | ORAL | Status: DC
  Administered 2017-01-21 – 2017-01-22 (×2): 20 meq via ORAL
  Filled 2017-01-21 (×2): qty 1

## 2017-01-21 MED ORDER — MORPHINE SULFATE (PF) 4 MG/ML IV SOLN
4.0000 mg | Freq: Once | INTRAVENOUS | Status: AC
  Administered 2017-01-21: 4 mg via INTRAVENOUS

## 2017-01-21 MED ORDER — DOCUSATE SODIUM 100 MG PO CAPS: 100.0000 mg | ORAL_CAPSULE | Freq: Two times a day (BID) | ORAL | Status: DC | PRN

## 2017-01-21 NOTE — ED Provider Notes (Signed)
[facility] Emergency Department Provider Note  Time seen: 7:32 AM  I have reviewed the triage vital signs and the nursing notes.   HISTORY  Chief Complaint Dysuria and Hip Pain    HPI Elizabeth Fernandez is a 83 y.o. female with a past medical history of arthritis, presents to the emergency department for pelvis and left leg pain. According to the patient approximately 4 weeks ago she fell landing on her buttocks. States she had no pain after the fall approximately 3 weeks ago developed right buttock pain which she states radiated to her left buttock and left leg. States over the past 3 weeks the left leg pain has progressively worsened. She has seen her primary care doctor 3 times over the course of the past 3 weeks. She has gone through a course of prednisone, states she was prescribed tramadol which was not effective, went back and was prescribed Norco which was not effective. Patient states the pain has continued to progressively worsen. Her doctor had obtained x-rays which were normal obtain a bone scan. Patient states the pain has gotten intolerable she is no longer able to ambulate at home. For the past 2 days she has been lying in bed urinating on herself. She states this is due to the intense pain and not being able to ambulate due to pain. Patient arrived by EMS.  Past Medical History:  Diagnosis Date  . Arthritis    both knees  . Cataract    Surgery scheduled for 03/2014    Patient Active Problem List   Diagnosis Date Noted  . Acute left-sided low back pain without sciatica 01/15/2017  . Osteoporosis 01/01/2017  . Leg swelling 11/19/2016  . Postmenopausal estrogen deficiency 11/19/2016  . Cataract 10/06/2015  . Situational anxiety 08/02/2015  . Hemorrhagic eye 12/18/2014  . Encounter to establish care 12/09/2014  . History of fracture of leg 12/09/2014    Past Surgical History:  Procedure Laterality Date  . CHOLECYSTECTOMY    . FEMUR SURGERY   11/10/2014    Prior to Admission medications   Medication Sig Start Date End Date Taking? Authorizing Provider  calcium-vitamin D (OSCAL WITH D) 500-200 MG-UNIT per tablet Take 1 tablet by mouth 2 (two) times daily. Reported on 10/06/2015   Yes [provider]  docusate calcium (SURFAK) 240 MG capsule Take 240 mg by mouth at bedtime. Reported on 10/06/2015   Yes [provider]  HYDROcodone-acetaminophen (NORCO/VICODIN) 5-325 MG tablet Take 1 tablet by mouth every 8 (eight) hours as needed for moderate pain. 01/15/17  Yes Arnett, Yvetta Coder, FNP  ibuprofen (ADVIL,MOTRIN) 200 MG tablet Take 200 mg by mouth every 6 (six) hours as needed.   Yes [provider]  meloxicam (MOBIC) 7.5 MG tablet Take 1 tablet (7.5 mg total) by mouth daily. Take with food. 12/25/16  Yes Arnett, Yvetta Coder, FNP  traMADol (ULTRAM) 50 MG tablet Take 1 tablet (50 mg total) by mouth every 6 (six) hours as needed. 12/28/16  Yes Crecencio Mc, MD  predniSONE (DELTASONE) 10 MG tablet Take 4 tablets ( total 40 mg) by mouth for 2 days; take 3 tablets ( total 30 mg) by mouth for 2 days; take 2 tablets ( total 20 mg) by mouth for 1 day; take 1 tablet ( total 10 mg) by mouth for 1 day. Patient not taking: Reported on 01/21/2017 01/15/17   Burnard Hawthorne, FNP    Allergies  Allergen Reactions  . Pollen Extract  Family History  Problem Relation Age of Onset  . Cancer Mother   . Heart disease Father     Social History Social History  Substance Use Topics  . Smoking status: Never Smoker  . Smokeless tobacco: Never Used  . Alcohol use No    Review of Systems Constitutional: Negative for fever. Cardiovascular: Negative for chest pain. Respiratory: Negative for shortness of breath. Gastrointestinal: Negative for abdominal pain Genitourinary: Negative for dysuria. Musculoskeletal: Left hip pain, left leg pain, pelvis pain Skin: Negative for rash. Neurological: Negative for headache All other  ROS negative  ____________________________________________   PHYSICAL EXAM:  VITAL SIGNS: ED Triage Vitals  Enc Vitals Group     BP 01/21/17 0536 (!) 181/95     Pulse Rate 01/21/17 0536 87     Resp 01/21/17 0536 18     Temp 01/21/17 0536 98.2 F (36.8 C)     Temp Source 01/21/17 0536 Oral     SpO2 01/21/17 0536 97 %     Weight 01/21/17 0538 154 lb (69.9 kg)     Height 01/21/17 0538 5\' 4"  (1.626 m)     Head Circumference --      Peak Flow --      Pain Score 01/21/17 0535 10     Pain Loc --      Pain Edu? --      Excl. in [place]? --     Constitutional: Alert and oriented. Well appearing and in no distress. Eyes: Normal exam ENT   Head: Normocephalic and atraumatic.   Mouth/Throat: Mucous membranes are moist. Cardiovascular: Normal rate, regular rhythm. No murmur Respiratory: Normal respiratory effort without tachypnea nor retractions. Breath sounds are clear  Gastrointestinal: Soft and nontender. No distention.   Musculoskeletal: Nontender with normal range of motion in all extremities. No lower extremity tenderness or edema. Good range of motion. Neurologic:  Normal speech and language. No gross focal neurologic deficits  Skin:  Skin is warm, dry and intact.  Psychiatric: Mood and affect are normal.   ____________________________________________   RADIOLOGY  Pelvis x-ray negative  ____________________________________________   INITIAL IMPRESSION / ASSESSMENT AND PLAN / ED COURSE  Pertinent labs & imaging results that were available during my care of the patient were reviewed by me and considered in my medical decision making (see chart for details).  Patient presents the emergency department for left-sided leg pain, much worse with attempted ambulation which has progressively worsened over the past 3 weeks. She has an appointment with orthopedics on Wednesday but states over the past 2 days the pain has become unbearable she can no longer ambulate even with the  use of a walker and pain medication at home. Has been stuck in bed urinating on herself because she cannot get to the restroom. Called EMS this morning to take her here. Here she appears well, does appear to be in some discomfort although she states the pain is improved with morphine. Continues to state moderate to significant pain with any attempted movement we'll continue with pain control we will check labs. As the patient has not been able to walk for the past 2 days highly anticipated admission to the hospital for further workup and treatment.  Labs are largely within normal limits. X-ray negative in the emergency department. We will admit to the hospital for further pain management and further workup.  ____________________________________________   FINAL CLINICAL IMPRESSION(S) / ED DIAGNOSES  Neuropathic pain Musculoskeletal pain     Harvest Dark, MD 01/21/17 [phone]

## 2017-01-21 NOTE — Progress Notes (Signed)
Pt arrived via stretcher from the Ed at 1400, was transferred to bed by staff. Pt was changed for wet brief on arrival. Pt is continent but stated that she is unable ot move enough to avoid using a diaper at this time. Pt is able to assist with rolling from side to side a little. Pt is on room air, lungs clear bilat, Hr is regular, abdomen is soft, bs heard. Ppp, no edema noted. Pt stated that none fo the interventions employed for pain management over the past three weeks have given her any relief. piv #20 intact to lac, site is free of redness and swelling. Pt's son at bedside, this Probation officer reviewed MD orders with patient and son, both verbalized understanding. Pt oriented to room and call bell, assisted with meal order, and pt has received percocet for pain. Discussed pain regimen with pt, pt agreeable to receiving pain medication every two hours or so to attempt better pain control.

## 2017-01-21 NOTE — ED Triage Notes (Signed)
Pt arrives via [facility] with c/o right hip pain that radiates around and down her left leg. Pt reports falling x3 weeks ago and having scans done since that time showing no breaks. Pt also reports dysuria starting Friday night. Pt is A/O x4 and in NAD at this time.

## 2017-01-21 NOTE — ED Notes (Signed)
Pt admit orders on hold per [facility] until MRI results are back - this will determine whether the pt is admitted here or at [facility] on the floor notified of change and family notified to return to ER RM 26 until determination can be made of admission status

## 2017-01-21 NOTE — ED Notes (Signed)
Admitting into to see pt at this time.

## 2017-01-21 NOTE — Care Management Obs Status (Signed)
[place] NOTIFICATION   Patient Details  Name: Elizabeth Fernandez MRN: [id number] Date of Birth: March 10, 1934   Medicare Observation Status Notification Given:  Yes    Jolly Mango, RN 01/21/2017, 3:54 PM

## 2017-01-21 NOTE — Progress Notes (Signed)
I was called in for admission. I had placed admission orders and then called neurosurgeon for consult. Dr.Cook suggested to have MRI lumber spine first, then- of pt ned urgent surgery- transfer to him to [facility]. If not urgent- then we can admit here. I have already seen the pt. Will cancel my admission order now, and wait till MRI is resulted. Discussed with pt and her niece in room. Time spent 45 min.

## 2017-01-21 NOTE — Telephone Encounter (Signed)
Informed patient but she went to ED due to the pain.  Patient stated she doesn't need the medication right now. She stated she will await to see what ED does.

## 2017-01-21 NOTE — ED Notes (Signed)
ER tech requested to take pt to MRI when available.

## 2017-01-21 NOTE — Telephone Encounter (Signed)
Call pt  Refilled until she can be seen  By Dr Sharlet Salina.   Please let her know that norco is for temporary, short term pain relief.

## 2017-01-21 NOTE — H&P (Signed)
[facility] NAME: Elizabeth Fernandez    MR#:  [id number]  DATE OF BIRTH:  08-Apr-1934  DATE OF ADMISSION:  01/21/2017  PRIMARY CARE PHYSICIAN: Burnard Hawthorne, FNP   REQUESTING/REFERRING PHYSICIAN: Paduchowski  CHIEF COMPLAINT:   Chief Complaint  Patient presents with  . Dysuria  . Hip Pain    HISTORY OF PRESENT ILLNESS: Elizabeth Fernandez  is a 83 y.o. female with a known history of arthritis, cataract- takes ibuprofen daily, able to walk without difficulty 1 month ago. Then - she had pain in her right side hip and it resolved in 2-3 days- and started having pain in left hip. It was sharp, constant- but fluctuating in intensity. No relieving factors. Radiating to her left calf. Denies associated numbness and weakness in leg- but due to severe pain- she could not walk and so finally came to ER.  PAST MEDICAL HISTORY:   Past Medical History:  Diagnosis Date  . Arthritis    both knees  . Cataract    Surgery scheduled for 03/2014  . Medical history non-contributory     PAST SURGICAL HISTORY: Past Surgical History:  Procedure Laterality Date  . CHOLECYSTECTOMY    . FEMUR SURGERY  11/10/2014    SOCIAL HISTORY:  Social History  Substance Use Topics  . Smoking status: Never Smoker  . Smokeless tobacco: Never Used  . Alcohol use No    FAMILY HISTORY:  Family History  Problem Relation Age of Onset  . Cancer Mother   . Heart disease Father     DRUG ALLERGIES:  Allergies  Allergen Reactions  . Pollen Extract     REVIEW OF SYSTEMS:   CONSTITUTIONAL: No fever, fatigue or weakness.  EYES: No blurred or double vision.  EARS, NOSE, AND THROAT: No tinnitus or ear pain.  RESPIRATORY: No cough, shortness of breath, wheezing or hemoptysis.  CARDIOVASCULAR: No chest pain, orthopnea, edema.  GASTROINTESTINAL: No nausea, vomiting, diarrhea or abdominal pain.  GENITOURINARY: No dysuria, hematuria.  ENDOCRINE: No polyuria, nocturia,   HEMATOLOGY: No anemia, easy bruising or bleeding SKIN: No rash or lesion. MUSCULOSKELETAL: No joint pain or arthritis. Lower back left side pain.   NEUROLOGIC: No tingling, numbness, weakness.  PSYCHIATRY: No anxiety or depression.   MEDICATIONS AT HOME:  Prior to Admission medications   Medication Sig Start Date End Date Taking? Authorizing Provider  calcium-vitamin D (OSCAL WITH D) 500-200 MG-UNIT per tablet Take 1 tablet by mouth 2 (two) times daily. Reported on 10/06/2015   Yes [provider]  docusate calcium (SURFAK) 240 MG capsule Take 240 mg by mouth at bedtime. Reported on 10/06/2015   Yes [provider]  ibuprofen (ADVIL,MOTRIN) 200 MG tablet Take 200 mg by mouth every 6 (six) hours as needed.   Yes [provider]  meloxicam (MOBIC) 7.5 MG tablet Take 1 tablet (7.5 mg total) by mouth daily. Take with food. 12/25/16  Yes Arnett, Yvetta Coder, FNP  traMADol (ULTRAM) 50 MG tablet Take 1 tablet (50 mg total) by mouth every 6 (six) hours as needed. 12/28/16  Yes Crecencio Mc, MD  HYDROcodone-acetaminophen (NORCO/VICODIN) 5-325 MG tablet Take 1 tablet by mouth every 8 (eight) hours as needed for moderate pain. 01/21/17   Burnard Hawthorne, FNP  predniSONE (DELTASONE) 10 MG tablet Take 4 tablets ( total 40 mg) by mouth for 2 days; take 3 tablets ( total 30 mg) by mouth for 2 days; take 2 tablets ( total  20 mg) by mouth for 1 day; take 1 tablet ( total 10 mg) by mouth for 1 day. Patient not taking: Reported on 01/21/2017 01/15/17   Burnard Hawthorne, FNP      PHYSICAL EXAMINATION:   VITAL SIGNS: Blood pressure (!) 169/67, pulse 87, temperature 99.2 F (37.3 C), temperature source Axillary, resp. rate 18, height 5\' 4"  (1.626 m), weight 69.9 kg (154 lb), SpO2 95 %.  GENERAL:  83 y.o.-year-old patient lying in the bed with no acute distress.  EYES: Pupils equal, round, reactive to light and accommodation. No scleral icterus. Extraocular muscles intact.  HEENT: Head  atraumatic, normocephalic. Oropharynx and nasopharynx clear.  NECK:  Supple, no jugular venous distention. No thyroid enlargement, no tenderness.  LUNGS: Normal breath sounds bilaterally, no wheezing, rales,rhonchi or crepitation. No use of accessory muscles of respiration.  CARDIOVASCULAR: S1, S2 normal. No murmurs, rubs, or gallops.  ABDOMEN: Soft, nontender, nondistended. Bowel sounds present. No organomegaly or mass. On left lower back- tender on local pressure. EXTREMITIES: No pedal edema, cyanosis, or clubbing.  NEUROLOGIC: Cranial nerves II through XII are intact. Muscle strength 5/5 in all extremities, except not moving left lower limb much due to fear of pain in back. Sensation intact. Gait not checked.  PSYCHIATRIC: The patient is alert and oriented x 3.  SKIN: No obvious rash, lesion, or ulcer.   LABORATORY PANEL:   CBC  Recent Labs Lab 01/21/17 0755  WBC 12.6*  HGB 13.8  HCT 40.5  PLT 286  MCV 89.3  MCH 30.5  MCHC 34.1  RDW 12.8   ------------------------------------------------------------------------------------------------------------------  Chemistries   Recent Labs Lab 01/21/17 0755  NA 138  K 3.3*  CL 99*  CO2 31  GLUCOSE 88  BUN 20  CREATININE 0.73  CALCIUM 8.9   ------------------------------------------------------------------------------------------------------------------ estimated creatinine clearance is 52.9 mL/min (by C-G formula based on SCr of 0.73 mg/dL). ------------------------------------------------------------------------------------------------------------------ No results for input(s): TSH, T4TOTAL, T3FREE, THYROIDAB in the last 72 hours.  Invalid input(s): FREET3   Coagulation profile No results for input(s): INR, PROTIME in the last 168 hours. ------------------------------------------------------------------------------------------------------------------- No results for input(s): DDIMER in the last 72  hours. -------------------------------------------------------------------------------------------------------------------  Cardiac Enzymes No results for input(s): CKMB, TROPONINI, MYOGLOBIN in the last 168 hours.  Invalid input(s): CK ------------------------------------------------------------------------------------------------------------------ Invalid input(s): POCBNP  ---------------------------------------------------------------------------------------------------------------  Urinalysis    Component Value Date/Time   COLORURINE STRAW (A) 01/21/2017 0546   APPEARANCEUR CLEAR (A) 01/21/2017 0546   APPEARANCEUR Clear 11/10/2014 1425   LABSPEC 1.013 01/21/2017 0546   LABSPEC 1.016 11/10/2014 1425   PHURINE 7.0 01/21/2017 0546   GLUCOSEU NEGATIVE 01/21/2017 0546   GLUCOSEU Negative 11/10/2014 1425   HGBUR NEGATIVE 01/21/2017 0546   BILIRUBINUR NEGATIVE 01/21/2017 0546   BILIRUBINUR Negative 11/10/2014 1425   KETONESUR 5 (A) 01/21/2017 0546   PROTEINUR NEGATIVE 01/21/2017 0546   NITRITE NEGATIVE 01/21/2017 0546   LEUKOCYTESUR NEGATIVE 01/21/2017 0546   LEUKOCYTESUR Negative 11/10/2014 1425     RADIOLOGY: Dg Pelvis 1-2 Views  Result Date: 01/21/2017 CLINICAL DATA:  Fall 3 weeks ago with bilateral hip pain. EXAM: PELVIS - 1-2 VIEW COMPARISON:  Pelvis and hip radiographs 12/25/2016 FINDINGS: No evidence of acute or healing fracture. Surgical hardware in the left proximal femur, intact where visualized. Pubic rami and pubic symphysis are congruent. 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 IMPRESSION: No evidence of acute or healing pelvic fracture. Intact surgical hardware in the left proximal femur. Electronically Signed   By: Jeb Levering M.D.   On: 01/21/2017  06:17   Mr Lumbar Spine Wo Contrast  Result Date: 01/21/2017 CLINICAL DATA:  Bilateral posterior pelvic pain extending into the left leg. Fall 4 weeks ago with symptoms beginning approximately 3 weeks ago.  EXAM: MRI LUMBAR SPINE WITHOUT CONTRAST TECHNIQUE: Multiplanar, multisequence MR imaging of the lumbar spine was performed. No intravenous contrast was administered. COMPARISON:  None. FINDINGS: Segmentation: 5 non rib-bearing lumbar type vertebral bodies are present. Alignment: Slight retrolisthesis is present at L1-2, L2-3, and L3-4. Levoconvex curvature is centered at L3-4. Vertebrae: Chronic endplate marrow changes are present at L2-3 bilaterally. A endplate marrow changes are present at L5-S1 with fusion across the disc space. Edema within the sacral ala bilaterally is compatible with acute/ subacute bilateral sacral insufficiency fractures. Conus medullaris: Extends to the T12 level and appears normal. Paraspinal and other soft tissues: Limited imaging of the abdomen is unremarkable. Disc levels: T12-L1:  Negative. L1-2:  Mild disc bulging is present without significant stenosis. L2-3: A broad-based disc protrusion is present. Mild facet hypertrophy is noted bilaterally. This results in mild subarticular and foraminal narrowing bilaterally. L3-4: Mild facet hypertrophy is worse on the right. A rightward disc protrusion is noted. The central canal is patent. Mild right foraminal narrowing is evident. L4-5: A broad-based disc protrusion is present. Mild facet hypertrophy is noted bilaterally. Moderate left subarticular narrowing is present. Moderate left and mild right foraminal narrowing is evident. L5-S1: There is fusion across the disc space. No focal stenosis is present. IMPRESSION: 1. Bilateral acute/subacute sacral insufficiency fractures. 2. Mild subarticular and foraminal narrowing bilaterally at L2-3. 3. Mild right foraminal narrowing at L3-4. 4. Moderate left subarticular and foraminal stenosis at L4-5. This is the most significant left-sided disease and may account for the left lower extremity radiculopathy. 5. Mild right foraminal narrowing. 6. Fusion across the disc space at L5-S1 without focal  stenosis at this level. Electronically Signed   By: San Morelle M.D.   On: 01/21/2017 12:01    EKG: Orders placed or performed in visit on 10/06/15  . EKG 12-Lead    IMPRESSION AND PLAN:  * Back pain   Functional decline.   Found to have sacral fracture.   Pain management and PT eval.   Ortho and neuro surgery consult.  * hypokalemia   Replace oral.  * osteoporosis   Cont vit D and calcium.   All the records are reviewed and case discussed with ED provider. Management plans discussed with the patient, family and they are in agreement.  CODE STATUS: full.    Code Status Orders        Start     Ordered   01/21/17 1400  Full code  Continuous     01/21/17 1359    Code Status History    Date Active Date Inactive Code Status Order ID Comments User Context   This patient has a current code status but no historical code status.     Pt's niece was present in room during my visit.  TOTAL TIME TAKING CARE OF THIS PATIENT: 50 minutes.    Vaughan Basta M.D on 01/21/2017   Between 7am to 6pm - Pager - [phone]  After 6pm go to [email or web address] - password [email or web address] [facility]  Office  [phone]  CC: Primary care physician; Burnard Hawthorne, FNP   Note: This dictation was prepared with Dragon dictation along with smaller phrase technology. Any transcriptional errors that result from this process are unintentional.

## 2017-01-22 ENCOUNTER — Telehealth: Payer: Self-pay | Admitting: *Deleted

## 2017-01-22 DIAGNOSIS — E876 Hypokalemia: Secondary | ICD-10-CM | POA: Diagnosis not present

## 2017-01-22 DIAGNOSIS — M549 Dorsalgia, unspecified: Secondary | ICD-10-CM | POA: Diagnosis not present

## 2017-01-22 DIAGNOSIS — S3210XA Unspecified fracture of sacrum, initial encounter for closed fracture: Secondary | ICD-10-CM | POA: Diagnosis not present

## 2017-01-22 DIAGNOSIS — M4858XA Collapsed vertebra, not elsewhere classified, sacral and sacrococcygeal region, initial encounter for fracture: Secondary | ICD-10-CM | POA: Diagnosis not present

## 2017-01-22 DIAGNOSIS — M48061 Spinal stenosis, lumbar region without neurogenic claudication: Secondary | ICD-10-CM | POA: Diagnosis not present

## 2017-01-22 LAB — CBC
HCT: 39.2 % (ref 35.0–47.0)
Hemoglobin: 13.5 g/dL (ref 12.0–16.0)
MCH: 30.6 pg (ref 26.0–34.0)
MCHC: 34.3 g/dL (ref 32.0–36.0)
MCV: 89.4 fL (ref 80.0–100.0)
Platelets: 268 10*3/uL (ref 150–440)
RBC: 4.39 MIL/uL (ref 3.80–5.20)
RDW: 13.5 % (ref 11.5–14.5)
WBC: 8.3 10*3/uL (ref 3.6–11.0)

## 2017-01-22 LAB — BASIC METABOLIC PANEL
Anion gap: 7 (ref 5–15)
BUN: 22 mg/dL — AB (ref 6–20)
CALCIUM: 8.5 mg/dL — AB (ref 8.9–10.3)
CO2: 32 mmol/L (ref 22–32)
Chloride: 101 mmol/L (ref 101–111)
Creatinine, Ser: 0.74 mg/dL (ref 0.44–1.00)
GFR calc non Af Amer: >60 mL/min (ref >60)
Glucose, Bld: 86 mg/dL (ref 65–99)
Potassium: 4.7 mmol/L (ref 3.5–5.1)
SODIUM: 140 mmol/L (ref 135–145)

## 2017-01-22 MED ORDER — ALUM & MAG HYDROXIDE-SIMETH 200-200-20 MG/5ML PO SUSP
30.0000 mL | Freq: Four times a day (QID) | ORAL | Status: DC | PRN
  Administered 2017-01-22: 30 mL via ORAL
  Filled 2017-01-22: qty 30

## 2017-01-22 MED ORDER — ACETAMINOPHEN 325 MG PO TABS: 650.0000 mg | ORAL_TABLET | Freq: Four times a day (QID) | ORAL | Status: DC | PRN

## 2017-01-22 MED ORDER — OXYCODONE-ACETAMINOPHEN 5-325 MG PO TABS
1.0000 | ORAL_TABLET | ORAL | Status: DC | PRN
  Administered 2017-01-22 – 2017-01-24 (×6): 1 via ORAL
  Filled 2017-01-22 (×6): qty 1

## 2017-01-22 MED ORDER — MELATONIN 5 MG PO TABS
5.0000 mg | ORAL_TABLET | Freq: Every evening | ORAL | Status: DC | PRN
  Administered 2017-01-22 – 2017-01-23 (×2): 5 mg via ORAL
  Filled 2017-01-22 (×3): qty 1

## 2017-01-22 NOTE — Progress Notes (Signed)
Shift assessment completed. Pt is awake, alert and oriented with son at bedside. Pt rating pain to l leg as 7/10 while lying still, worse with movement. Pt is on room air, lungs clear bilat, Hr is regular, abdomen is soft, bs heard.ppp, slight edema to l leg in comparison tio r leg, pt has small abrasion to l buttock that she stated is from using heating pad at home for pain relief. Since assessment completed, Dr. Harlow Mares has been in on consult and explained mri to son and patient, explained no surgery needed for sacral fracture. Pt has voided via bedpan and is able to roll over in the bed with minimal assist. PT eval being completed at this time. Pt received percocet for pain.

## 2017-01-22 NOTE — Telephone Encounter (Signed)
Son states he will try the best he can.

## 2017-01-22 NOTE — Progress Notes (Signed)
[facility] NAME: Elizabeth Fernandez    MR#:  [id number]  DATE OF BIRTH:  09-18-1933  SUBJECTIVE:  CHIEF COMPLAINT:   Chief Complaint  Patient presents with  . Dysuria  . Hip Pain    came with hip and left leg pain for few weeks, not able to walk much. Still same pain. Seen by ortho, no need for surgery .  REVIEW OF SYSTEMS:  CONSTITUTIONAL: No fever, fatigue or weakness.  EYES: No blurred or double vision.  EARS, NOSE, AND THROAT: No tinnitus or ear pain.  RESPIRATORY: No cough, shortness of breath, wheezing or hemoptysis.  CARDIOVASCULAR: No chest pain, orthopnea, edema.  GASTROINTESTINAL: No nausea, vomiting, diarrhea or abdominal pain.  GENITOURINARY: No dysuria, hematuria.  ENDOCRINE: No polyuria, nocturia,  HEMATOLOGY: No anemia, easy bruising or bleeding SKIN: No rash or lesion. MUSCULOSKELETAL: No joint pain or arthritis.   NEUROLOGIC: No tingling, numbness, weakness.  PSYCHIATRY: No anxiety or depression.   ROS  DRUG ALLERGIES:   Allergies  Allergen Reactions  . Pollen Extract     VITALS:  Blood pressure (!) 124/53, pulse 94, temperature 98.2 F (36.8 C), temperature source Oral, resp. rate 16, height 5\' 4"  (1.626 m), weight 69.9 kg (154 lb), SpO2 95 %.  PHYSICAL EXAMINATION:  GENERAL:  83 y.o.-year-old patient lying in the bed with no acute distress.  EYES: Pupils equal, round, reactive to light and accommodation. No scleral icterus. Extraocular muscles intact.  HEENT: Head atraumatic, normocephalic. Oropharynx and nasopharynx clear.  NECK:  Supple, no jugular venous distention. No thyroid enlargement, no tenderness.  LUNGS: Normal breath sounds bilaterally, no wheezing, rales,rhonchi or crepitation. No use of accessory muscles of respiration.  CARDIOVASCULAR: S1, S2 normal. No murmurs, rubs, or gallops.  ABDOMEN: Soft, nontender, nondistended. Bowel sounds present. No organomegaly or mass. On left lower back-  tender on local pressure. EXTREMITIES: No pedal edema, cyanosis, or clubbing.  NEUROLOGIC: Cranial nerves II through XII are intact. Muscle strength 5/5 in all extremities, except not moving left lower limb much due to fear of pain in back. Sensation intact. Gait not checked.  PSYCHIATRIC: The patient is alert and oriented x 3.  SKIN: No obvious rash, lesion, or ulcer.  Physical Exam LABORATORY PANEL:   CBC  Recent Labs Lab 01/22/17 0429  WBC 8.3  HGB 13.5  HCT 39.2  PLT 268   ------------------------------------------------------------------------------------------------------------------  Chemistries   Recent Labs Lab 01/22/17 0429  NA 140  K 4.7  CL 101  CO2 32  GLUCOSE 86  BUN 22*  CREATININE 0.74  CALCIUM 8.5*   ------------------------------------------------------------------------------------------------------------------  Cardiac Enzymes No results for input(s): TROPONINI in the last 168 hours. ------------------------------------------------------------------------------------------------------------------  RADIOLOGY:  Dg Pelvis 1-2 Views  Result Date: 01/21/2017 CLINICAL DATA:  Fall 3 weeks ago with bilateral hip pain. EXAM: PELVIS - 1-2 VIEW COMPARISON:  Pelvis and hip radiographs 12/25/2016 FINDINGS: No evidence of acute or healing fracture. Surgical hardware in the left proximal femur, intact where visualized. Pubic rami and pubic symphysis are congruent. 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 IMPRESSION: No evidence of acute or healing pelvic fracture. Intact surgical hardware in the left proximal femur. Electronically Signed   By: Jeb Levering M.D.   On: 01/21/2017 06:17   Mr Lumbar Spine Wo Contrast  Result Date: 01/21/2017 CLINICAL DATA:  Bilateral posterior pelvic pain extending into the left leg. Fall 4 weeks ago with symptoms beginning approximately 3 weeks ago. EXAM: MRI LUMBAR SPINE  WITHOUT CONTRAST TECHNIQUE: Multiplanar,  multisequence MR imaging of the lumbar spine was performed. No intravenous contrast was administered. COMPARISON:  None. FINDINGS: Segmentation: 5 non rib-bearing lumbar type vertebral bodies are present. Alignment: Slight retrolisthesis is present at L1-2, L2-3, and L3-4. Levoconvex curvature is centered at L3-4. Vertebrae: Chronic endplate marrow changes are present at L2-3 bilaterally. A endplate marrow changes are present at L5-S1 with fusion across the disc space. Edema within the sacral ala bilaterally is compatible with acute/ subacute bilateral sacral insufficiency fractures. Conus medullaris: Extends to the T12 level and appears normal. Paraspinal and other soft tissues: Limited imaging of the abdomen is unremarkable. Disc levels: T12-L1:  Negative. L1-2:  Mild disc bulging is present without significant stenosis. L2-3: A broad-based disc protrusion is present. Mild facet hypertrophy is noted bilaterally. This results in mild subarticular and foraminal narrowing bilaterally. L3-4: Mild facet hypertrophy is worse on the right. A rightward disc protrusion is noted. The central canal is patent. Mild right foraminal narrowing is evident. L4-5: A broad-based disc protrusion is present. Mild facet hypertrophy is noted bilaterally. Moderate left subarticular narrowing is present. Moderate left and mild right foraminal narrowing is evident. L5-S1: There is fusion across the disc space. No focal stenosis is present. IMPRESSION: 1. Bilateral acute/subacute sacral insufficiency fractures. 2. Mild subarticular and foraminal narrowing bilaterally at L2-3. 3. Mild right foraminal narrowing at L3-4. 4. Moderate left subarticular and foraminal stenosis at L4-5. This is the most significant left-sided disease and may account for the left lower extremity radiculopathy. 5. Mild right foraminal narrowing. 6. Fusion across the disc space at L5-S1 without focal stenosis at this level. Electronically Signed   By: San Morelle M.D.   On: 01/21/2017 12:01    ASSESSMENT AND PLAN:   Principal Problem:   Back pain Active Problems:   Sacral fracture (HCC)  * Back pain   Functional decline.    sacral fracture.   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    Pain management and PT eval.   Ortho and neuro surgery consult.   As per nurse, orthopedic surgeon told her that no surgery needed for sacral fracture.   I spoke to neurosurgeon Dr. Lacinda Axon and he suggested as this is chronic and degenerative changes and patient's old age, he did not recommend to have surgery for no compressions. He will find out who can give the injections to relieve the pain at the nerve roots and he will arrange for that today. He will come and see patient tomorrow as today he is in [facility].   * hypokalemia   Replaced oral.  * osteoporosis   Cont vit D and calcium.    All the records are reviewed and case discussed with Care Management/Social Workerr. Management plans discussed with the patient, family and they are in agreement.  CODE STATUS: full.  TOTAL TIME TAKING CARE OF THIS PATIENT: 35 minutes.   Discussed with her son in room and neurosurgeon on phone.  POSSIBLE D/C IN 1-2 DAYS, DEPENDING ON CLINICAL CONDITION.   Vaughan Basta M.D on 01/22/2017   Between 7am to 6pm - Pager - [phone]  After 6pm go to [email or web address] - password [email or web address] [facility]  Office  [phone]  CC: Primary care physician; Burnard Hawthorne, FNP  Note: This dictation was prepared with Dragon dictation along with smaller phrase technology. Any transcriptional errors that result from this process are unintentional.

## 2017-01-22 NOTE — Clinical Social Work Note (Signed)
Clinical Social Work Assessment  Patient Details  Name: Elizabeth Fernandez MRN: [id number] Date of Birth: June 13, 1934  Date of referral:  01/22/17               Reason for consult:  Facility Placement                Permission sought to share information with:  Chartered certified accountant granted to share information::  Yes, Verbal Permission Granted  Name::      [place]::   [place]   Relationship::     Contact Information:     Housing/Transportation Living arrangements for the past 2 months:  [place] of Information:  Patient, Adult Children Patient Interpreter Needed:  None Criminal Activity/Legal Involvement Pertinent to Current Situation/Hospitalization:  No - Comment as needed Significant Relationships:  Adult Children Lives with:  Adult Children Do you feel safe going back to the place where you live?  Yes Need for family participation in patient care:  Yes (Comment)  Care giving concerns:  Patient lives with her son Elizabeth Fernandez in [place], [place].    Social Worker assessment / plan:  Holiday representative (CSW) met with patient and her son Elizabeth Fernandez cell # [phone] was at bedside. Patient was alert and oriented X4 and was sitting up in the chair. CSW introduced self and explained role of CSW department. Patient reported that she lives in [place] with her youngest son Elizabeth Fernandez. Per Elizabeth Fernandez he lives near [place]. CSW explained that PT is recommending SNF and that [facility] will have to approve SNF. Patient and son are agreeable to SNF search in [place]. FL2 complete and faxed out. CSW started [facility] SNF authorization via telephone and will fax in clinicals as soon as PT note is available. CSW will continue to follow and assist as needed.   Employment status:  Retired Nurse, adult PT Recommendations:  [place] / Referral to community resources:   [place]  Patient/Family's Response to care:  Patient and son are agreeable to SNF search in [place].   Patient/Family's Understanding of and Emotional Response to Diagnosis, Current Treatment, and Prognosis:  Patient and her son were very pleasant and thanked CSW for assistance.   Emotional Assessment Appearance:  Appears stated age Attitude/Demeanor/Rapport:    Affect (typically observed):  Accepting, Adaptable, Pleasant Orientation:  Oriented to Self, Oriented to Place, Oriented to  Time, Oriented to Situation Alcohol / Substance use:  Not Applicable Psych involvement (Current and /or in the community):  No (Comment)  Discharge Needs  Concerns to be addressed:  Discharge Planning Concerns Readmission within the last 30 days:  No Current discharge risk:  Dependent with Mobility Barriers to Discharge:  Continued Medical Work up   [employer], Veronia Beets, LCSW 01/22/2017, 12:12 PM

## 2017-01-22 NOTE — Consult Note (Signed)
ORTHOPAEDIC CONSULTATION  REQUESTING PHYSICIAN: Vaughan Basta, *  Chief Complaint: hip pain  HPI: Elizabeth Fernandez is a 83 y.o. female who complains of  Hip and back pain. She was admitted through the ED yesterday and an MRI was obtained. Please see hospitalist and ED note for details.  Past Medical History:  Diagnosis Date  . Arthritis    both knees  . Cataract    Surgery scheduled for 03/2014  . Medical history non-contributory    Past Surgical History:  Procedure Laterality Date  . CHOLECYSTECTOMY    . FEMUR SURGERY  11/10/2014   Social History   Social History  . Marital status: Married    Spouse name: N/A  . Number of children: N/A  . Years of education: N/A   Social History Main Topics  . Smoking status: Never Smoker  . Smokeless tobacco: Never Used  . Alcohol use No  . Drug use: No  . Sexual activity: Not Currently   Other Topics Concern  . 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    Lives with son    Children- 2    Grandchildren- none    Pets: 1 dog inside    Enjoys- Reading and mowing yard       Family History  Problem Relation Age of Onset  . Cancer Mother   . Heart disease Father    Allergies  Allergen Reactions  . Pollen Extract    Prior to Admission medications   Medication Sig Start Date End Date Taking? Authorizing Provider  calcium-vitamin D (OSCAL WITH D) 500-200 MG-UNIT per tablet Take 1 tablet by mouth 2 (two) times daily. Reported on 10/06/2015   Yes [provider]  docusate calcium (SURFAK) 240 MG capsule Take 240 mg by mouth at bedtime. Reported on 10/06/2015   Yes [provider]  ibuprofen (ADVIL,MOTRIN) 200 MG tablet Take 200 mg by mouth every 6 (six) hours as needed.   Yes [provider]  meloxicam (MOBIC) 7.5 MG tablet Take 1 tablet (7.5 mg total) by mouth daily. Take with food. 12/25/16  Yes Arnett, Yvetta Coder, FNP  traMADol (ULTRAM) 50 MG  tablet Take 1 tablet (50 mg total) by mouth every 6 (six) hours as needed. 12/28/16  Yes Crecencio Mc, MD  HYDROcodone-acetaminophen (NORCO/VICODIN) 5-325 MG tablet Take 1 tablet by mouth every 8 (eight) hours as needed for moderate pain. 01/21/17   Burnard Hawthorne, FNP  predniSONE (DELTASONE) 10 MG tablet Take 4 tablets ( total 40 mg) by mouth for 2 days; take 3 tablets ( total 30 mg) by mouth for 2 days; take 2 tablets ( total 20 mg) by mouth for 1 day; take 1 tablet ( total 10 mg) by mouth for 1 day. Patient not taking: Reported on 01/21/2017 01/15/17   Burnard Hawthorne, FNP   Dg Pelvis 1-2 Views  Result Date: 01/21/2017 CLINICAL DATA:  Fall 3 weeks ago with bilateral hip pain. EXAM: PELVIS - 1-2 VIEW COMPARISON:  Pelvis and hip radiographs 12/25/2016 FINDINGS: No evidence of acute or healing fracture. Surgical hardware in the left proximal femur, intact where visualized. Pubic rami and pubic symphysis are congruent. 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 IMPRESSION: No evidence of acute or healing pelvic fracture. Intact surgical hardware in the left proximal femur. Electronically Signed   By: Jeb Levering M.D.   On: 01/21/2017 06:17   Mr Lumbar Spine Wo Contrast  Result Date: 01/21/2017 CLINICAL DATA:  Bilateral posterior pelvic pain extending into the left leg. Fall 4 weeks ago with symptoms beginning approximately 3 weeks ago. EXAM: MRI LUMBAR SPINE WITHOUT CONTRAST TECHNIQUE: Multiplanar, multisequence MR imaging of the lumbar spine was performed. No intravenous contrast was administered. COMPARISON:  None. FINDINGS: Segmentation: 5 non rib-bearing lumbar type vertebral bodies are present. Alignment: Slight retrolisthesis is present at L1-2, L2-3, and L3-4. Levoconvex curvature is centered at L3-4. Vertebrae: Chronic endplate marrow changes are present at L2-3 bilaterally. A endplate marrow changes are present at L5-S1 with fusion across the disc space. Edema within the sacral ala  bilaterally is compatible with acute/ subacute bilateral sacral insufficiency fractures. Conus medullaris: Extends to the T12 level and appears normal. Paraspinal and other soft tissues: Limited imaging of the abdomen is unremarkable. Disc levels: T12-L1:  Negative. L1-2:  Mild disc bulging is present without significant stenosis. L2-3: A broad-based disc protrusion is present. Mild facet hypertrophy is noted bilaterally. This results in mild subarticular and foraminal narrowing bilaterally. L3-4: Mild facet hypertrophy is worse on the right. A rightward disc protrusion is noted. The central canal is patent. Mild right foraminal narrowing is evident. L4-5: A broad-based disc protrusion is present. Mild facet hypertrophy is noted bilaterally. Moderate left subarticular narrowing is present. Moderate left and mild right foraminal narrowing is evident. L5-S1: There is fusion across the disc space. No focal stenosis is present. IMPRESSION: 1. Bilateral acute/subacute sacral insufficiency fractures. 2. Mild subarticular and foraminal narrowing bilaterally at L2-3. 3. Mild right foraminal narrowing at L3-4. 4. Moderate left subarticular and foraminal stenosis at L4-5. This is the most significant left-sided disease and may account for the left lower extremity radiculopathy. 5. Mild right foraminal narrowing. 6. Fusion across the disc space at L5-S1 without focal stenosis at this level. Electronically Signed   By: San Morelle M.D.   On: 01/21/2017 12:01    Positive ROS: All other systems have been reviewed and were otherwise negative with the exception of those mentioned in the HPI and as above.  Physical Exam: General: Alert, no acute distress Cardiovascular: No pedal edema Respiratory: No cyanosis, no use of accessory musculature GI: No organomegaly, abdomen is soft and non-tender Skin: No lesions in the area of chief complaint Neurologic: Sensation intact distally Psychiatric: Patient is competent  for consent with normal mood and affect Lymphatic: No axillary or cervical lymphadenopathy  MUSCULOSKELETAL: pain with IR/ER rotation of hip, pelvis stable  Assessment: Sacral insufficiency fractures of indeterminate age Spinal stenosis  Plan: Patient may weight bear as tolerated. Continue conservative measures. Continue physical therapy. Please call with questions.    Lovell Sheehan, MD    01/22/2017 8:19 AM

## 2017-01-22 NOTE — Clinical Social Work Placement (Signed)
   CLINICAL SOCIAL WORK PLACEMENT  NOTE  Date:  01/22/2017  Patient Details  Name: Elizabeth Fernandez MRN: [id number] Date of Birth: [birth year]  Clinical Social Work is seeking post-discharge placement for this patient at the [place] level of care (*CSW will initial, date and re-position this form in  chart as items are completed):  Yes   Patient/family provided with [facility] Work Department's list of facilities offering this level of care within the geographic area requested by the patient (or if unable, by the patient's family).  Yes   Patient/family informed of their freedom to choose among providers that offer the needed level of care, that participate in Medicare, Medicaid or managed care program needed by the patient, have an available bed and are willing to accept the patient.  Yes   Patient/family informed of [facility]'s ownership interest in [facility] and [facility], as well as of the fact that they are under no obligation to receive care at these facilities.  PASRR submitted to EDS on       PASRR number received on       Existing PASRR number confirmed on 01/22/17     FL2 transmitted to all facilities in geographic area requested by pt/family on 01/22/17     FL2 transmitted to all facilities within larger geographic area on       Patient informed that his/her managed care company has contracts with or will negotiate with certain facilities, including the following:            Patient/family informed of bed offers received.  Patient chooses bed at       Physician recommends and patient chooses bed at      Patient to be transferred to   on  .  Patient to be transferred to facility by       Patient family notified on   of transfer.  Name of family member notified:        PHYSICIAN       Additional Comment:    _______________________________________________ Erum Cercone, Veronia Beets, LCSW 01/22/2017, 12:11 PM

## 2017-01-22 NOTE — Telephone Encounter (Signed)
Elizabeth Fernandez just wanted Elizabeth Fernandez to be in the loop involving his mothers care.  He also stated, to see if Elizabeth Fernandez agrees with Dr. Kathyrn Lass from ED.  Is there anything else that would be needed to give his mother the best care?

## 2017-01-22 NOTE — Progress Notes (Addendum)
Clinical Education officer, museum (CSW) presented bed offers to patient and her son Lennette Bihari. They chose [facility] and [facility] admissions coordinators at [facility] are aware of accepted bed offer. CSW started [facility] SNF authorization and faxed in clinicals. CSW will continue to follow and assist as needed.   [employer], LCSW [phone]

## 2017-01-22 NOTE — NC FL2 (Signed)
[place] LEVEL OF CARE SCREENING TOOL     IDENTIFICATION  Patient Name: Elizabeth Fernandez Birthdate: 24-Apr-1934 Sex: female Admission Date (Current Location): 01/21/2017  [place] and [place] Number:  Engineering geologist and Address:  [facility], [address] [place], [place] [address]      Provider Number: [id number]  Attending Physician Name and Address:  Vaughan Basta, *  Relative Name and Phone Number:       Current Level of Care: Hospital Recommended Level of Care: [place] Prior Approval Number:    Date Approved/Denied:   PASRR Number:  ([id number] )  Discharge Plan: SNF    Current Diagnoses: Patient Active Problem List   Diagnosis Date Noted  . Back pain 01/21/2017  . Sacral fracture ([place]) 01/21/2017  . Acute left-sided low back pain without sciatica 01/15/2017  . Osteoporosis 01/01/2017  . Leg swelling 11/19/2016  . Postmenopausal estrogen deficiency 11/19/2016  . Cataract 10/06/2015  . Situational anxiety 08/02/2015  . Hemorrhagic eye 12/18/2014  . Encounter to establish care 12/09/2014  . History of fracture of leg 12/09/2014    Orientation RESPIRATION BLADDER Height & Weight     Self, Time, Situation, Place  Normal Incontinent Weight: 154 lb (69.9 kg) Height:  5\' 4"  (162.6 cm)  BEHAVIORAL SYMPTOMS/MOOD NEUROLOGICAL BOWEL NUTRITION STATUS   (none)  (none) Continent Diet (Regular Diet )  AMBULATORY STATUS COMMUNICATION OF NEEDS Skin   Extensive Assist Verbally Normal                       Personal Care Assistance Level of Assistance  Bathing, Feeding, Dressing Bathing Assistance: Limited assistance Feeding assistance: Independent Dressing Assistance: Limited assistance     Functional Limitations Info  Sight, Hearing, Speech Sight Info: Adequate Hearing Info: Adequate Speech Info: Adequate    SPECIAL CARE FACTORS FREQUENCY  PT (By licensed PT), OT (By licensed OT)      PT Frequency:  (5) OT Frequency:  (5)            Contractures      Additional Factors Info  Code Status, Allergies Code Status Info:  (Full Code. ) Allergies Info:  (Pollen Extract)           Current Medications (01/22/2017):  This is the current hospital active medication list Current Facility-Administered Medications  Medication Dose Route Frequency Provider Last Rate Last Dose  . calcium-vitamin D (OSCAL WITH D) 500-200 MG-UNIT per tablet 1 tablet  1 tablet Oral BID Vaughan Basta, MD   1 tablet at 01/22/17 0831  . docusate sodium (COLACE) capsule 100 mg  100 mg Oral BID PRN Vaughan Basta, MD      . docusate sodium (COLACE) capsule 100 mg  100 mg Oral QHS Vaughan Basta, MD   100 mg at 01/21/17 2107  . heparin injection 5,000 Units  5,000 Units Subcutaneous Q8H Vaughan Basta, MD   5,000 Units at 01/22/17 0528  . HYDROcodone-acetaminophen (NORCO/VICODIN) 5-325 MG per tablet 1 tablet  1 tablet Oral Q8H PRN Vaughan Basta, MD      . ibuprofen (ADVIL,MOTRIN) tablet 200 mg  200 mg Oral Q6H PRN Vaughan Basta, MD      . meloxicam (MOBIC) tablet 7.5 mg  7.5 mg Oral Daily Vaughan Basta, MD   7.5 mg at 01/22/17 0831  . oxyCODONE-acetaminophen (PERCOCET/ROXICET) 5-325 MG per tablet 1 tablet  1 tablet Oral Q4H PRN Vaughan Basta, MD   1 tablet at 01/22/17  0831  . potassium chloride SA (K-DUR,KLOR-CON) CR tablet 20 mEq  20 mEq Oral BID Vaughan Basta, MD   20 mEq at 01/22/17 0831  . traMADol (ULTRAM) tablet 50 mg  50 mg Oral Q6H PRN Vaughan Basta, MD   50 mg at 01/22/17 1683     Discharge Medications: Please see discharge summary for a list of discharge medications.  Relevant Imaging Results:  Relevant Lab Results:   Additional Information  (SSN: [id number])  Sample, Veronia Beets, LCSW

## 2017-01-22 NOTE — Evaluation (Signed)
Physical Therapy Evaluation Patient Details Name: Elizabeth Fernandez MRN: [id number] DOB: 14-Jan-1934 Today's Date: 01/22/2017   History of Present Illness  83 y.o. female with a known history of arthritis, cataract who fell ~1 month ago while pushmowing her yard.  She initially felt okay but after a few days started having some R sided pain and then ultimately stated having severe L sided pain/limitations as well as increased difficulty walking or even getting to standing/transfering.  Found to have b/l sacral fractures as well as lumbar stenosis (most severely at L4-5).  Clinical Impression  Pt showed good effort with PT assessment, but was pain limited and generally had a lot of pain with any movement and especially with [place] activities.  She was able to do some minimal mobility with assist but was very limited with standing and ultimately could not do any walking and only heel-toe scooted from bed to recliner.  Pt is not at all safe to return home, L LE not numb, but she had radiating pain into lateral calf with most movements and generally c/o pain in L hip t/o most of the session.    Follow Up Recommendations SNF    Equipment Recommendations       Recommendations for Other Services       Precautions / Restrictions Restrictions Weight Bearing Restrictions: No      Mobility  Bed Mobility Overal bed mobility: Needs Assistance Bed Mobility: Supine to Sit     Supine to sit: Mod assist;Min assist     General bed mobility comments: Pt showed good effort in getting to sitting, ultimately had signficant pain with the position and struggled to maintain static sitting  Transfers Overall transfer level: Needs assistance Equipment used: Rolling walker (2 wheeled) Transfers: Sit to/from Stand Sit to Stand: Max assist;Mod assist         General transfer comment: Pt made a good effort to get up with only CGA, but was unable.  She needed heavy assist to get to and maintain standing -  ultimately pt was unable to really put weight through L LE.  Ambulation/Gait Ambulation/Gait assistance: Max assist Ambulation Distance (Feet): 3 Feet Assistive device: Rolling walker (2 wheeled)       General Gait Details: Pt needed heavy assist to maintain standing and pt was only able to heel-toe with great effort to get to recliner  Stairs            Wheelchair Mobility    Modified Rankin (Stroke Patients Only)       Balance Overall balance assessment: Needs assistance Sitting-balance support: Bilateral upper extremity supported Sitting balance-Leahy Scale: Fair     Standing balance support: Bilateral upper extremity supported Standing balance-Leahy Scale: Poor Standing balance comment: Pt keeping L LE off the ground, struggles to do anything in standing needs assist to stay upright                             Pertinent Vitals/Pain Pain Assessment: 0-10 Pain Score: 9  (3/10 at rest, increases severely with any movement) Pain Location: L hip and radiating down L LE    Home Living Family/patient expects to be discharged to:: Private residence Living Arrangements: Children Available Help at Discharge: Family Type of Home: House Home Access: Stairs to enter Entrance Stairs-Rails: Can reach both Entrance Stairs-Number of Steps: 2   Home Equipment: Cane - single point;Walker - 2 wheels;Bedside commode      Prior Function  Level of Independence: Independent         Comments: typically is very active and independent w/o AD, walks 1.5 miles multiple days a week     Hand Dominance        Extremity/Trunk Assessment   Upper Extremity Assessment Upper Extremity Assessment: Overall WFL for tasks assessed (guarded secondary to pain)    Lower Extremity Assessment Lower Extremity Assessment: Generalized weakness (R LE grossly 4-/5, L pain limited grossly 3/5)       Communication   Communication: No difficulties  Cognition Arousal/Alertness:  Awake/alert Behavior During Therapy: Anxious Overall Cognitive Status: Within Functional Limits for tasks assessed                                        General Comments      Exercises     Assessment/Plan    PT Assessment Patient needs continued PT services  PT Problem List Decreased strength;Decreased range of motion;Decreased activity tolerance;Decreased balance;Decreased mobility;Decreased knowledge of use of DME;Decreased safety awareness;Pain       PT Treatment Interventions Gait training;DME instruction;Functional mobility training;Therapeutic activities;Therapeutic exercise;Balance training;Patient/family education    PT Goals (Current goals can be found in the Care Plan section)  Acute Rehab PT Goals Patient Stated Goal: get back to active lifestyle PT Goal Formulation: With patient Time For Goal Achievement: 02/05/17 Potential to Achieve Goals: Fair    Frequency 7X/week   Barriers to discharge        Co-evaluation               AM-PAC PT "6 Clicks" Daily Activity  Outcome Measure Difficulty turning over in bed (including adjusting bedclothes, sheets and blankets)?: Total Difficulty moving from lying on back to sitting on the side of the bed? : Total Difficulty sitting down on and standing up from a chair with arms (e.g., wheelchair, bedside commode, etc,.)?: Total Help needed moving to and from a bed to chair (including a wheelchair)?: A Lot Help needed walking in hospital room?: Total Help needed climbing 3-5 steps with a railing? : Total 6 Click Score: 7    End of Session Equipment Utilized During Treatment: Gait belt Activity Tolerance: Patient limited by pain Patient left: with chair alarm set;with call bell/phone within reach;with family/visitor present   PT Visit Diagnosis: Pain;Muscle weakness (generalized) (M62.81);Difficulty in walking, not elsewhere classified (R26.2) Pain - Right/Left: Left Pain - part of body: Hip;Leg     Time: 0459-9774 PT Time Calculation (min) (ACUTE ONLY): 33 min   Charges:   PT Evaluation $PT Eval Low Complexity: 1 Procedure     PT G Codes:   PT G-Codes **NOT FOR INPATIENT CLASS** Functional Assessment Tool Used: AM-PAC 6 Clicks Basic Mobility Functional Limitation: Mobility: Walking and moving around Mobility: Walking and Moving Around Current Status (F4239): At least 80 percent but less than 100 percent impaired, limited or restricted Mobility: Walking and Moving Around Goal Status [phone]): At least 40 percent but less than 60 percent impaired, limited or restricted    Kreg Shropshire, DPT 01/22/2017, 12:35 PM

## 2017-01-22 NOTE — Telephone Encounter (Signed)
Patient's son requested to have a call in reference to test completed by this office.   Contact [phone]

## 2017-01-22 NOTE — Telephone Encounter (Signed)
Call son-  I have been reviewing  Notes while she admitted and agree with plan so far.    I would advise him to accompany her when she comes for post hospitalization f/u appt

## 2017-01-23 ENCOUNTER — Encounter: Payer: Self-pay | Admitting: Internal Medicine

## 2017-01-23 DIAGNOSIS — S3210XA Unspecified fracture of sacrum, initial encounter for closed fracture: Secondary | ICD-10-CM | POA: Diagnosis not present

## 2017-01-23 DIAGNOSIS — E876 Hypokalemia: Secondary | ICD-10-CM | POA: Diagnosis not present

## 2017-01-23 DIAGNOSIS — M545 Low back pain: Secondary | ICD-10-CM | POA: Diagnosis not present

## 2017-01-23 DIAGNOSIS — M549 Dorsalgia, unspecified: Secondary | ICD-10-CM | POA: Diagnosis not present

## 2017-01-23 MED ORDER — OXYCODONE-ACETAMINOPHEN 5-325 MG PO TABS: 1.0000 | ORAL_TABLET | ORAL | 0 refills | Status: DC | PRN

## 2017-01-23 NOTE — Progress Notes (Addendum)
Interventional radiology scheduled the patient and can get her epidural steroid injection on Friday. Discussed with patient. She still complains of pain with ambulation. Radiology unable to do her procedure tomorrow.

## 2017-01-23 NOTE — Discharge Summary (Signed)
Elizabeth Fernandez NAME: Elizabeth Fernandez    MR#:  [id number]  DATE OF BIRTH:  1933-08-22  DATE OF ADMISSION:  01/21/2017 ADMITTING PHYSICIAN: Vaughan Basta, MD  DATE OF DISCHARGE: 01/23/2017  PRIMARY CARE PHYSICIAN: Burnard Hawthorne, FNP   ADMISSION DIAGNOSIS:  Pain [R52] Radicular pain [M54.10] Left leg pain [M79.605] Sacral fracture (HCC) [S32.10XA]  DISCHARGE DIAGNOSIS:  Principal Problem:   Back pain Active Problems:   Sacral fracture (HCC)   SECONDARY DIAGNOSIS:   Past Medical History:  Diagnosis Date  . Arthritis    both knees  . Cataract    Surgery scheduled for 03/2014  . Medical history non-contributory      ADMITTING HISTORY  HISTORY OF PRESENT ILLNESS: Elizabeth Fernandez  is a 83 y.o. female with a known history of arthritis, cataract- takes ibuprofen daily, able to walk without difficulty 1 month ago. Then - she had pain in her right side hip and it resolved in 2-3 days- and started having pain in left hip. It was sharp, constant- but fluctuating in intensity. No relieving factors. Radiating to her left calf. Denies associated numbness and weakness in leg- but due to severe pain- she could not walk and so finally came to ER.  HOSPITAL COURSE:   * Left clinical L5 lumbar radiculopathy secondary to L4/5 facet arthropathy and lateral recess stenosis.  Seen by neurosurgery and recommended L4/5 epidural steroid injection. Scheduled on 01/31/2017 at Dr. Saralyn Pilar office. Pain controlled by percocet. Also f/u with Dr. Cari Caraway in 1 week.  * Sacral fracture- Conservative management Seen by orthopedics  * hypokalemia Replaced   * osteoporosis  Stable for discharge to SNF.  CONSULTS OBTAINED:  Treatment Team:  Leola Brazil, MD  DRUG ALLERGIES:   Allergies  Allergen Reactions  . Pollen Extract     DISCHARGE MEDICATIONS:   Current Discharge Medication List    START taking these  medications   Details  oxyCODONE-acetaminophen (PERCOCET/ROXICET) 5-325 MG tablet Take 1 tablet by mouth every 4 (four) hours as needed for severe pain. Qty: 30 tablet, Refills: 0      CONTINUE these medications which have NOT CHANGED   Details  calcium-vitamin D (OSCAL WITH D) 500-200 MG-UNIT per tablet Take 1 tablet by mouth 2 (two) times daily. Reported on 10/06/2015    docusate calcium (SURFAK) 240 MG capsule Take 240 mg by mouth at bedtime. Reported on 10/06/2015    meloxicam (MOBIC) 7.5 MG tablet Take 1 tablet (7.5 mg total) by mouth daily. Take with food. Qty: 90 tablet, Refills: 1   Associated Diagnoses: Acute right-sided low back pain without sciatica      STOP taking these medications     ibuprofen (ADVIL,MOTRIN) 200 MG tablet      traMADol (ULTRAM) 50 MG tablet      HYDROcodone-acetaminophen (NORCO/VICODIN) 5-325 MG tablet      predniSONE (DELTASONE) 10 MG tablet         Today   VITAL SIGNS:  Blood pressure (!) 126/56, pulse 73, temperature 98 F (36.7 C), temperature source Oral, resp. rate 18, height 5\' 4"  (1.626 m), weight 69.9 kg (154 lb), SpO2 94 %.  I/O:   Intake/Output Summary (Last 24 hours) at 01/23/17 1549 Last data filed at 01/23/17 1200  Gross per 24 hour  Intake              480 ml  Output  0 ml  Net              480 ml    PHYSICAL EXAMINATION:  Physical Exam  GENERAL:  83 y.o.-year-old patient lying in the bed with no acute distress.  LUNGS: Normal breath sounds bilaterally, no wheezing, rales,rhonchi or crepitation. No use of accessory muscles of respiration.  CARDIOVASCULAR: S1, S2 normal. No murmurs, rubs, or gallops.  ABDOMEN: Soft, non-tender, non-distended. Bowel sounds present. No organomegaly or mass.  NEUROLOGIC: Moves all 4 extremities. PSYCHIATRIC: The patient is alert and oriented x 3.  SKIN: No obvious rash, lesion, or ulcer.   DATA REVIEW:   CBC  Recent Labs Lab 01/22/17 0429  WBC 8.3  HGB 13.5  HCT  39.2  PLT 268    Chemistries   Recent Labs Lab 01/22/17 0429  NA 140  K 4.7  CL 101  CO2 32  GLUCOSE 86  BUN 22*  CREATININE 0.74  CALCIUM 8.5*    Cardiac Enzymes No results for input(s): TROPONINI in the last 168 hours.  Microbiology Results  No results found for this or any previous visit.  RADIOLOGY:  No results found.  Follow up with PCP in 1 week.  Management plans discussed with the patient, family and they are in agreement.  CODE STATUS:     Code Status Orders        Start     Ordered   01/21/17 1400  Full code  Continuous     01/21/17 1359    Code Status History    Date Active Date Inactive Code Status Order ID Comments User Context   This patient has a current code status but no historical code status.      TOTAL TIME TAKING CARE OF THIS PATIENT ON DAY OF DISCHARGE: more than 30 minutes.   Hillary Bow R M.D on 01/23/2017 at 3:49 PM  Between 7am to 6pm - Pager - [phone]  After 6pm go to [email or web address] - password [email or web address] [place] [facility]  Office  [phone]  CC: Primary care physician; Burnard Hawthorne, FNP  Note: This dictation was prepared with Dragon dictation along with smaller phrase technology. Any transcriptional errors that result from this process are unintentional.

## 2017-01-23 NOTE — Progress Notes (Signed)
Referral for L 4/5 epidural steroid injection at the pain clinic with Dr. Sharlet Salina. For low back pain

## 2017-01-23 NOTE — Progress Notes (Signed)
[place] [facility] [place] NAME: Elizabeth Fernandez    MR#:  [id number]  DATE OF BIRTH:  [birth year]  SUBJECTIVE:  CHIEF COMPLAINT:   Chief Complaint  Patient presents with  . Dysuria  . Hip Pain    came with hip and left leg pain for few weeks, not able to walk much.  Pain improved. Neurosurgery is recommending L4/5 epidural steroid injection  REVIEW OF SYSTEMS:  CONSTITUTIONAL: No fever, fatigue or weakness.  EYES: No blurred or double vision.  EARS, NOSE, AND THROAT: No tinnitus or ear pain.  RESPIRATORY: No cough, shortness of breath, wheezing or hemoptysis.  CARDIOVASCULAR: No chest pain, orthopnea, edema.  GASTROINTESTINAL: No nausea, vomiting, diarrhea or abdominal pain.  GENITOURINARY: No dysuria, hematuria.  ENDOCRINE: No polyuria, nocturia,  HEMATOLOGY: No anemia, easy bruising or bleeding SKIN: No rash or lesion. MUSCULOSKELETAL: No joint pain or arthritis.   NEUROLOGIC: No tingling, numbness, weakness.  PSYCHIATRY: No anxiety or depression.   ROS  DRUG ALLERGIES:   Allergies  Allergen Reactions  . Pollen Extract     VITALS:  Blood pressure (!) 126/56, pulse 73, temperature 98 F (36.7 C), temperature source Oral, resp. rate 18, height 5\' 4"  (1.626 m), weight 69.9 kg (154 lb), SpO2 94 %.  PHYSICAL EXAMINATION:  GENERAL:  83 y.o.-year-old patient lying in the bed with no acute distress.  EYES: Pupils equal, round, reactive to light and accommodation. No scleral icterus. Extraocular muscles intact.  HEENT: Head atraumatic, normocephalic. Oropharynx and nasopharynx clear.  NECK:  Supple, no jugular venous distention. No thyroid enlargement, no tenderness.  LUNGS: Normal breath sounds bilaterally, no wheezing, rales,rhonchi or crepitation. No use of accessory muscles of respiration.  CARDIOVASCULAR: S1, S2 normal. No murmurs, rubs, or gallops.  ABDOMEN: Soft, nontender, nondistended. Bowel sounds present. No organomegaly or mass.  On left lower back- tender on local pressure. EXTREMITIES: No pedal edema, cyanosis, or clubbing.  NEUROLOGIC: Cranial nerves II through XII are intact. Muscle strength 5/5 in all extremities, except not moving left lower limb much due to fear of pain in back. Sensation intact. Gait not checked.  PSYCHIATRIC: The patient is alert and oriented x 3.  SKIN: No obvious rash, lesion, or ulcer.   Physical Exam LABORATORY PANEL:   CBC  Recent Labs Lab 01/22/17 0429  WBC 8.3  HGB 13.5  HCT 39.2  PLT 268   ------------------------------------------------------------------------------------------------------------------  Chemistries   Recent Labs Lab 01/22/17 0429  NA 140  K 4.7  CL 101  CO2 32  GLUCOSE 86  BUN 22*  CREATININE 0.74  CALCIUM 8.5*   ---------------------------------------------------------------------------------------------------------------- Cardiac Enzymes No results for input(s): TROPONINI in the last 168 hours. ---------------------------------------------------------------------------------------------------------------- RADIOLOGY:  No results found.  ASSESSMENT AND PLAN:   Principal Problem:   Back pain Active Problems:   Sacral fracture (HCC)  * Back pain with Functional decline and subacute sacral fracture.   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 No surgery needed for the sacral fracture per orthopedics. Discussed with neurosurgery Dr. Cari Caraway. He is recommending L4-5 epidural steroid injection. I discussed with interventional radiology Dr. Vernard Gambles. Patient will be scheduled for this procedure tomorrow. I have stopped her subcutaneous heparin. SCDs ordered.  Once pain improves can be discharged to skilled nursing facility. Discussed with social work.  * hypokalemia   Replaced   * osteoporosis   Cont vit D and calcium.  All the records are reviewed and case discussed with Care Management/Social  Worker Management plans discussed with the patient, family and they are in agreement.  CODE STATUS: full.  TOTAL TIME TAKING CARE OF THIS PATIENT: 35 minutes.   POSSIBLE D/C IN 1-2 DAYS, DEPENDING ON CLINICAL CONDITION.  Hillary Bow R M.D on 01/23/2017   Between 7am to 6pm - Pager - [phone]  After 6pm go to [email or web address] - password [email or web address] [place] [facility]  Office  [phone]  CC: Primary care physician; Burnard Hawthorne, FNP  Note: This dictation was prepared with Dragon dictation along with smaller phrase technology. Any transcriptional errors that result from this process are unintentional.

## 2017-01-23 NOTE — Progress Notes (Signed)
Plan was to discharge patient to SNF with outpt epidural injection. Patient and son refused and wanted inpatient procedure on Friday. This is scheduled for 1.30 PM. Discharge cancelled.

## 2017-01-23 NOTE — Progress Notes (Signed)
Pt alert and oriented. Pt using bedpan to void. Pt able to sleep in between care. No complaints during the night.

## 2017-01-23 NOTE — Discharge Instructions (Signed)
Regular diet ° °Activity as tolerated with assistance °

## 2017-01-23 NOTE — Progress Notes (Signed)
Clinical Education officer, museum (CSW) met with patient and her son Lennette Bihari and made them aware that because patient is under observation then her insurance may not pay for her hospital stay once she is medically stable for D/C. Son stated that patient will stay at [facility] until she is able to get the epidural injection on Friday. Patient's son reported that he would refuse for patient to discharge until she gets the epidural. Son voiced his frustration and anger about the epidural scheduling and doesn't understand why patient can't get it today or tomorrow. Son reported that patient cannot do much PT until she gets the epidural. CSW provided emotional support and made Ortho nursing director aware of son's concerns. CSW made MD aware of above. At this time the plan is for patient to D/C to [facility] Friday pending medical clearance. Blue Medicare SNF authorization expires in 48 hours. Alyse Low and [facility] admissions coordinators at [facility] are aware of above. CSW will continue to follow and assist as needed.   [employer], LCSW [phone]

## 2017-01-23 NOTE — Consult Note (Signed)
Referring Physician:  No referring provider defined for this encounter.  Primary Physician:  Burnard Hawthorne, FNP  Chief Complaint:  L leg pain  History of Present Illness: Elizabeth Fernandez is a 83 y.o. female who presents with the chief complaint of L leg pain ongoing for 4-6 weeks after a fall. She describes leg pain down the buttock to back of L leg to anterolateral calf stopping at the ankle.  She was going to be seen by Dr. Sharlet Salina today, but was unable to control her pain outside the hospital. No specific bowel or bladder dysfunction. She does have pain-limited weakness of her left leg. Pain is a 6. She describes sharp pain down the leg, made worse by walking.       She has not pursued PT or injections.  The symptoms are causing a significant impact on the patient's life.   Review of Systems:  A 10 point review of systems is negative, except for the pertinent positives and negatives detailed in the HPI.  Past Medical History: Past Medical History:  Diagnosis Date  . Arthritis    both knees  . Cataract    Surgery scheduled for 03/2014  . Medical history non-contributory     Past Surgical History: Past Surgical History:  Procedure Laterality Date  . CHOLECYSTECTOMY    . FEMUR SURGERY  11/10/2014    Allergies: Allergies as of 01/21/2017 - Review Complete 01/21/2017  Allergen Reaction Noted  . Pollen extract      Medications:  Current Facility-Administered Medications:  .  acetaminophen (TYLENOL) tablet 650 mg, 650 mg, Oral, Q6H PRN, Vaughan Basta, MD .  alum & mag hydroxide-simeth (MAALOX/MYLANTA) 200-200-20 MG/5ML suspension 30 mL, 30 mL, Oral, Q6H PRN, Lance Coon, MD, 30 mL at 01/22/17 2328 .  calcium-vitamin D (OSCAL WITH D) 500-200 MG-UNIT per tablet 1 tablet, 1 tablet, Oral, BID, Vaughan Basta, MD, 1 tablet at 01/23/17 0853 .  docusate sodium (COLACE) capsule 100 mg, 100 mg, Oral, BID PRN, Vaughan Basta, MD .  docusate sodium  (COLACE) capsule 100 mg, 100 mg, Oral, QHS, Vaughan Basta, MD, 100 mg at 01/22/17 2129 .  heparin injection 5,000 Units, 5,000 Units, Subcutaneous, Q8H, Vaughan Basta, MD, 5,000 Units at 01/23/17 0516 .  HYDROcodone-acetaminophen (NORCO/VICODIN) 5-325 MG per tablet 1 tablet, 1 tablet, Oral, Q8H PRN, Vaughan Basta, MD, 1 tablet at 01/23/17 1059 .  Melatonin TABS 5 mg, 5 mg, Oral, QHS PRN, Vaughan Basta, MD, 5 mg at 01/22/17 2129 .  meloxicam (MOBIC) tablet 7.5 mg, 7.5 mg, Oral, Daily, Vaughan Basta, MD, 7.5 mg at 01/23/17 0854 .  oxyCODONE-acetaminophen (PERCOCET/ROXICET) 5-325 MG per tablet 1 tablet, 1 tablet, Oral, Q4H PRN, Vaughan Basta, MD, 1 tablet at 01/23/17 1610   Social History: Social History  Substance Use Topics  . Smoking status: Never Smoker  . Smokeless tobacco: Never Used  . Alcohol use No    Family Medical History: Family History  Problem Relation Age of Onset  . Cancer Mother   . Heart disease Father     Physical Examination: Vitals:   01/23/17 0830 01/23/17 1012  BP: (!) 126/56   Pulse: 68 73  Resp: 18   Temp: 98 F (36.7 C)      General: Patient is well developed, well nourished, calm, collected, and in no apparent distress.  Psychiatric: Patient is non-anxious.  Head:  Pupils equal, round, and reactive to light.  ENT:  Oral mucosa appears well hydrated.  Neck:   Supple.  Full range of motion.  Respiratory: Patient is breathing without any difficulty.  Extremities: No edema.  Vascular: Palpable pulses in dorsal pedal vessels.  Skin:   On exposed skin, there are no abnormal skin lesions.  NEUROLOGICAL:  General: In no acute distress.   Awake, alert, oriented to person, place, and time.  Pupils equal round and reactive to light.  Facial tone is symmetric.  Tongue protrusion is midline.  There is no pronator drift.   Strength: Side Biceps Triceps Deltoid Interossei Grip Wrist Ext. Wrist  Flex.  R 5 5 5 5 5 5 5   L 5 5 5 5 5 5 5    Side Iliopsoas Quads Hamstring PF DF EHL  R 5 5 5 5 5 5   L 4+ 4+ 4+ 5 5 5    Bilateral upper and lower extremity sensation is intact to light touch.  Clonus is not present.  Toes are down-going.  Gait is untested.   Hoffman's is absent.  Imaging: MRI Lumbar spine 01/21/2017 IMPRESSION 1. Bilateral acute/subacute sacral insufficiency fractures. 2. Mild subarticular and foraminal narrowing bilaterally at L2-3. 3. Mild right foraminal narrowing at L3-4. 4. Moderate left subarticular and foraminal stenosis at L4-5. This is the most significant left-sided disease and may account for the left lower extremity radiculopathy. 5. Mild right foraminal narrowing. 6. Fusion across the disc space at L5-S1 without focal stenosis at this level.   Electronically Signed   By: San Morelle M.D.   On: 01/21/2017 12:01  I have personally reviewed the images and agree with the above interpretation.  Assessment and Plan: Elizabeth Fernandez is a pleasant 83 y.o. female with left clinical L5 lumbar radiculopathy secondary to L4/5 facet arthropathy and lateral recess stenosis.   - please refer for L4/5 epidural steroid injection by either Dr. Sharlet Salina, Dr. Dossie Arbour, or one of the interventional radiologists at [facility] - would continue PT as an outpatient - if she develops worsening weight-bearing back pain, sacroplasty could also be considered. Dr. Jimmye Norman has performed this for [place] in the past at [facility]. - we can see as an outpatient at [facility] clinic if she fails conservative management     Khristopher Kapaun K. Izora Ribas MD, [place] [facility]

## 2017-01-23 NOTE — Progress Notes (Signed)
Physical Therapy Treatment Patient Details Name: Elizabeth Fernandez MRN: [id number] DOB: June 17, 1934 Today's Date: 01/23/2017    History of Present Illness 83 y.o. female with a known history of arthritis, cataract who fell ~1 month ago while pushmowing her yard.  She initially felt okay but after a few days started having some R sided pain and then ultimately stated having severe L sided pain/limitations as well as increased difficulty walking or even getting to standing/transfering.  Found to have b/l sacral fractures as well as lumbar stenosis (most severely at L4-5).    PT Comments    Pt agreeable to PT. Pt notes pain in Left lower extremity with movement up to a 6/10 and 8/10 with weight bearing/ambulation. Pt educated in supine exercises with assist as needed; education on abdominal stabilization with exercise to protect spine/decrease pain with movement. Educated in minimal bridge and log roll technique to transfer supine to sit to edge of bed. Min A needed for final trunk elevation. Again encouraged abdominal stabilization for bed mobility and transfers for protection of spine. Min A for sit to/from stand transfers with increased time/effort. Mild unsteadiness in stand. Pt able to take several steps bed to chair with education on rolling walker and foot placement in regard to body with good correction. Pt received up in chair comfortably. Continue PT to progress strength without increased pain to improve all functional mobility.    Follow Up Recommendations  SNF     Equipment Recommendations       Recommendations for Other Services       Precautions / Restrictions Precautions Precautions: Fall Restrictions Weight Bearing Restrictions: No    Mobility  Bed Mobility Overal bed mobility: Needs Assistance Bed Mobility: Supine to Sit     Supine to sit: Min assist     General bed mobility comments: Educated on glute set to edge and log rolling techniques. Min A for final trunk  elevation  Transfers Overall transfer level: Needs assistance Equipment used: Rolling walker (2 wheeled) Transfers: Sit to/from Stand Sit to Stand: Min assist         General transfer comment: Cues for abdominal stab and glut set for sit to stand transfer as well as R reach back for controlled sit.   Ambulation/Gait Ambulation/Gait assistance: Min assist Ambulation Distance (Feet): 3 Feet Assistive device: Rolling walker (2 wheeled) Gait Pattern/deviations: Step-to pattern Gait velocity: slow Gait velocity interpretation: <1.8 ft/sec, indicative of risk for recurrent falls General Gait Details: min A to steady, instruction for rw position in regards to body and correct step lengths to avoid stepping past hands   Stairs            Wheelchair Mobility    Modified Rankin (Stroke Patients Only)       Balance Overall balance assessment: Needs assistance Sitting-balance support: Bilateral upper extremity supported;Feet supported Sitting balance-Leahy Scale: Fair     Standing balance support: Bilateral upper extremity supported Standing balance-Leahy Scale: Fair                              Cognition Arousal/Alertness: Awake/alert Behavior During Therapy: WFL for tasks assessed/performed Overall Cognitive Status: Within Functional Limits for tasks assessed                                        Exercises General Exercises - Lower Extremity  Ankle Circles/Pumps: AROM;Both;15 reps;Supine Quad Sets: Strengthening;Both;15 reps;Supine Gluteal Sets: Strengthening;Both;15 reps;Supine Short Arc Quad: AROM;Left;15 reps;Supine Long Arc Quad: AROM;Right;15 reps;Seated Heel Slides: AAROM;Left;15 reps;Supine Hip ABduction/ADduction: Left;15 reps;Supine;AAROM (AROM R)    General Comments        Pertinent Vitals/Pain Pain Assessment: 0-10 Pain Score: 6  (8 with mobility) Pain Location: L hip Pain Descriptors / Indicators: Sharp Pain  Intervention(s): Monitored during session;Repositioned;Other (comment) (due for medication; pt will request post PT)    Home Living                      Prior Function            PT Goals (current goals can now be found in the care plan section)      Frequency    7X/week      PT Plan Current plan remains appropriate    Co-evaluation              AM-PAC PT "6 Clicks" Daily Activity  Outcome Measure  Difficulty turning over in bed (including adjusting bedclothes, sheets and blankets)?: A Little Difficulty moving from lying on back to sitting on the side of the bed? : Total Difficulty sitting down on and standing up from a chair with arms (e.g., wheelchair, bedside commode, etc,.)?: Total Help needed moving to and from a bed to chair (including a wheelchair)?: A Little Help needed walking in hospital room?: A Little Help needed climbing 3-5 steps with a railing? : A Lot 6 Click Score: 13    End of Session Equipment Utilized During Treatment: Gait belt Activity Tolerance: Patient tolerated treatment well Patient left: in chair;with call bell/phone within reach;with chair alarm set;with family/visitor present   PT Visit Diagnosis: Pain;Muscle weakness (generalized) (M62.81);Difficulty in walking, not elsewhere classified (R26.2) Pain - Right/Left: Left Pain - part of body: Hip;Leg     Time: 1012-1050 PT Time Calculation (min) (ACUTE ONLY): 38 min  Charges:  $Gait Training: 8-22 mins $Therapeutic Exercise: 8-22 mins $Therapeutic Activity: 8-22 mins                    G Codes:  Functional Assessment Tool Used: AM-PAC 6 Clicks Basic Mobility     Larae Grooms, PTA 01/23/2017, 11:02 AM

## 2017-01-23 NOTE — Progress Notes (Signed)
[facility] Medicare SNF authorization has been received, Auth # [id number], RVB, recommended 550 therapy minutes per week. Patient can D/C to [facility] when medically stable. [facility] admissions coordinator at [facility] is aware of above. Patient and her son Elizabeth Fernandez are aware of above.   [employer], LCSW [phone]

## 2017-01-24 ENCOUNTER — Other Ambulatory Visit: Payer: Self-pay | Admitting: Radiology

## 2017-01-24 ENCOUNTER — Encounter (INDEPENDENT_AMBULATORY_CARE_PROVIDER_SITE_OTHER): Payer: Medicare Other | Admitting: Ophthalmology

## 2017-01-24 DIAGNOSIS — S3210XA Unspecified fracture of sacrum, initial encounter for closed fracture: Secondary | ICD-10-CM | POA: Diagnosis not present

## 2017-01-24 DIAGNOSIS — E876 Hypokalemia: Secondary | ICD-10-CM | POA: Diagnosis not present

## 2017-01-24 DIAGNOSIS — M549 Dorsalgia, unspecified: Secondary | ICD-10-CM | POA: Diagnosis not present

## 2017-01-24 NOTE — Progress Notes (Signed)
Pt offered pain medication, pt declined at this time.

## 2017-01-24 NOTE — Progress Notes (Signed)
Physical Therapy Treatment Patient Details Name: Elizabeth Fernandez MRN: [id number] DOB: Aug 20, 1933 Today's Date: 01/24/2017    History of Present Illness 83 y.o. female with a known history of arthritis, cataract who fell ~1 month ago while pushmowing her yard.  She initially felt okay but after a few days started having some R sided pain and then ultimately stated having severe L sided pain/limitations as well as increased difficulty walking or even getting to standing/transfering.  Found to have b/l sacral fractures as well as lumbar stenosis (most severely at L4-5).    PT Comments    Pt agreeable to PT; pt up in chair noting difficulty finding a comfortable position, so states she keeps changing positions. Reports 7/10 pain left hip/lateral thigh. Pt progressing sit to/from stand transfers to [place] guard with cues. Progressing ambulation to 24 ft with Min guard and increased education/cueing for proper step lengths and weight shift to upper extremities to decrease pain. Rolling walker lowered to appropriate height for improved management. Pt commended on positional changes for relief and educated further on use of positional release and counter pressure techniques for improved relief. Able to position pt in recliner with improved relief with Left lower extremity in partial hip/knee flexion and partial hip external rotation with counter pressure roll under mid hamstrings; also offered to try counter pressure at left buttock. Pt appreciative. Continue PT to progress/improve all functional mobility. Pt scheduled to receive back injection tomorrow 01/25/2017.   Follow Up Recommendations  SNF     Equipment Recommendations       Recommendations for Other Services       Precautions / Restrictions Precautions Precautions: Fall Restrictions Weight Bearing Restrictions: No    Mobility  Bed Mobility               General bed mobility comments: Not tested; up in chair  Transfers Overall  transfer level: Needs assistance Equipment used: Rolling walker (2 wheeled) Transfers: Sit to/from Stand Sit to Stand: Min guard         General transfer comment: Cues for hand placement and abdominal stabilization  Ambulation/Gait Ambulation/Gait assistance: Min guard Ambulation Distance (Feet): 24 Feet Assistive device: Rolling walker (2 wheeled) Gait Pattern/deviations: Step-to pattern;Decreased stride length;Decreased weight shift to left;Antalgic (occasional partial step through) Gait velocity: slow Gait velocity interpretation: <1.8 ft/sec, indicative of risk for recurrent falls General Gait Details: Slow, antalgic on L with swing and stance. Lowered rw for improved height and ability to de weight with use of UEs. Encouraged small steps to manage pain with swing and stance phases with improvement when compliant   Stairs            Wheelchair Mobility    Modified Rankin (Stroke Patients Only)       Balance Overall balance assessment: Needs assistance Sitting-balance support: Bilateral upper extremity supported;Feet supported Sitting balance-Leahy Scale: Good     Standing balance support: Bilateral upper extremity supported Standing balance-Leahy Scale: Fair                              Cognition Arousal/Alertness: Awake/alert Behavior During Therapy: WFL for tasks assessed/performed Overall Cognitive Status: Within Functional Limits for tasks assessed                                        Exercises General Exercises - Lower Extremity  Ankle Circles/Pumps: AROM;Both;20 reps Quad Sets: Strengthening;Both;10 reps Gluteal Sets: Strengthening;Both;10 reps Heel Slides: AAROM;Left;10 reps (AROM R) Hip ABduction/ADduction: AAROM;Left;10 reps (AROM R) Other Exercises Other Exercises: Education on positions of release and counterpressure to hamstring and piriformis/hip ER's on L    General Comments        Pertinent Vitals/Pain  Pain Assessment: 0-10 Pain Score: 7  Pain Location: L hip/buttock and lateral thigh Pain Descriptors / Indicators: Sharp Pain Intervention(s): Monitored during session;Premedicated before session;Repositioned;Other (comment) (education on counterpressure/slack muscle positioning tech)    Home Living                      Prior Function            PT Goals (current goals can now be found in the care plan section)      Frequency    7X/week      PT Plan Current plan remains appropriate    Co-evaluation              AM-PAC PT "6 Clicks" Daily Activity  Outcome Measure  Difficulty turning over in bed (including adjusting bedclothes, sheets and blankets)?: A Little Difficulty moving from lying on back to sitting on the side of the bed? : Total Difficulty sitting down on and standing up from a chair with arms (e.g., wheelchair, bedside commode, etc,.)?: Total Help needed moving to and from a bed to chair (including a wheelchair)?: A Little Help needed walking in hospital room?: A Little Help needed climbing 3-5 steps with a railing? : A Lot 6 Click Score: 13    End of Session Equipment Utilized During Treatment: Gait belt Activity Tolerance: Patient tolerated treatment well Patient left: in chair;with call bell/phone within reach;Other (comment) (towel roll under L hamstring)   PT Visit Diagnosis: Pain;Muscle weakness (generalized) (M62.81);Difficulty in walking, not elsewhere classified (R26.2) Pain - Right/Left: Left Pain - part of body: Hip;Leg     Time: 4982-6415 PT Time Calculation (min) (ACUTE ONLY): 32 min  Charges:  $Gait Training: 8-22 mins $Therapeutic Exercise: 8-22 mins                    G Codes:  Functional Assessment Tool Used: AM-PAC 6 Clicks Basic Mobility     Larae Grooms, PTA 01/24/2017, 12:45 PM

## 2017-01-24 NOTE — Progress Notes (Signed)
Pt is alert and oriented. Medicated at bedtime for pain with good results. Pt has been able to sleep in between care. Pt has gotten up and used bedside commode.

## 2017-01-24 NOTE — Progress Notes (Signed)
Shift assessment completed this am. Pt has worked with physical therapy and is able to stand/pivot transfer with staff to commode and recliner. This writer placed small foam dressing to pt's l buttock over area that pt had when she was admitted from using heating pad at home, no open area noted. piv #20 intact to lac, site is reinforced, no swelling or redness noted.Pt has received pain medication several times this shift, is anticipating injection to her back tomorrow.Dr. Margaretmary Eddy rounded on pt.

## 2017-01-24 NOTE — Plan of Care (Signed)
Problem: Bowel/Gastric: Goal: Will not experience complications related to bowel motility Outcome: Progressing Pt is progressing toward goals, mobility is improving.

## 2017-01-24 NOTE — Progress Notes (Signed)
[place] at [place] NAME: Elizabeth Fernandez    MR#:  [id number]  DATE OF BIRTH:  09/23/1933  SUBJECTIVE:  CHIEF COMPLAINT:   Chief Complaint  Patient presents with  . Dysuria  . Hip Pain    came with hip and left leg pain for few weeks, not able to walk much.  Pain Improved, out of bed to chair. Last bowel movement today   REVIEW OF SYSTEMS:  CONSTITUTIONAL: No fever, fatigue or weakness.  EYES: No blurred or double vision.  EARS, NOSE, AND THROAT: No tinnitus or ear pain.  RESPIRATORY: No cough, shortness of breath, wheezing or hemoptysis.  CARDIOVASCULAR: No chest pain, orthopnea, edema.  GASTROINTESTINAL: No nausea, vomiting, diarrhea or abdominal pain.  GENITOURINARY: No dysuria, hematuria.  ENDOCRINE: No polyuria, nocturia,  HEMATOLOGY: No anemia, easy bruising or bleeding SKIN: No rash or lesion. MUSCULOSKELETAL: Reporting back pain  NEUROLOGIC: No tingling, numbness, weakness.  PSYCHIATRY: No anxiety or depression.   ROS  DRUG ALLERGIES:   Allergies  Allergen Reactions  . Pollen Extract     VITALS:  Blood pressure (!) 123/40, pulse 79, temperature 98.7 F (37.1 C), temperature source Oral, resp. rate 17, height 5\' 4"  (1.626 m), weight 69.9 kg (154 lb), SpO2 95 %.  PHYSICAL EXAMINATION:  GENERAL:  83 y.o.-year-old patient lying in the bed with no acute distress.  EYES: Pupils equal, round, reactive to light and accommodation. No scleral icterus. Extraocular muscles intact.  HEENT: Head atraumatic, normocephalic. Oropharynx and nasopharynx clear.  NECK:  Supple, no jugular venous distention. No thyroid enlargement, no tenderness.  LUNGS: Normal breath sounds bilaterally, no wheezing, rales,rhonchi or crepitation. No use of accessory muscles of respiration.  CARDIOVASCULAR: S1, S2 normal. No murmurs, rubs, or gallops.  ABDOMEN: Soft, nontender, nondistended. Bowel sounds present. No organomegaly or mass. On left lower back-  tender on local pressure. EXTREMITIES: Back is diffusely tender no perineal signs .No pedal edema, cyanosis, or clubbing.  NEUROLOGIC: Cranial nerves II through XII are intact. Muscle strength 5/5 in all extremities, except not moving left lower limb much due to fear of pain in back. Sensation intact. Gait not checked.  PSYCHIATRIC: The patient is alert and oriented x 3.  SKIN: No obvious rash, lesion, or ulcer.   Physical Exam LABORATORY PANEL:   CBC  Recent Labs Lab 01/22/17 0429  WBC 8.3  HGB 13.5  HCT 39.2  PLT 268   ------------------------------------------------------------------------------------------------------------------  Chemistries   Recent Labs Lab 01/22/17 0429  NA 140  K 4.7  CL 101  CO2 32  GLUCOSE 86  BUN 22*  CREATININE 0.74  CALCIUM 8.5*   ---------------------------------------------------------------------------------------------------------------- Cardiac Enzymes No results for input(s): TROPONINI in the last 168 hours. ---------------------------------------------------------------------------------------------------------------- RADIOLOGY:  No results found.  ASSESSMENT AND PLAN:   Principal Problem:   Back pain Active Problems:   Sacral fracture (HCC)  * Back pain with Functional decline and subacute sacral fracture.   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 No surgery needed for the sacral fracture per orthopedics. Neurosurgery Dr. Cari Caraway has recommending L4-5 epidural steroid injection. DR.Sudini discussed with interventional radiology Dr. Vernard Gambles. Patient will be scheduled for this procedure  Tomorrow 6/22 , stopped her subcutaneous heparin. SCD for now   Once pain improves can be discharged to skilled nursing facility. Discussed with social work.  * hypokalemia   Replaced   * osteoporosis   Cont vit D and calcium.  Anticipating to discharge to skilled  nursing facility tomorrow, patient  is considering to go home  All the records are reviewed and case discussed with Care Management/Social Worker Management plans discussed with the patient, family and they are in agreement.  CODE STATUS: full.  TOTAL TIME TAKING CARE OF THIS PATIENT: 35 minutes.   POSSIBLE D/C IN 1-2 DAYS, DEPENDING ON CLINICAL CONDITION.  Nicholes Mango M.D on 01/24/2017   Between 7am to 6pm - Pager - [phone]  After 6pm go to [email or web address] - password [email or web address] [facility]  Office  [phone]  CC: Primary care physician; Burnard Hawthorne, FNP  Note: This dictation was prepared with Dragon dictation along with smaller phrase technology. Any transcriptional errors that result from this process are unintentional.

## 2017-01-24 NOTE — Progress Notes (Signed)
Clinical Education officer, museum (CSW) sent Elizabeth Fernandez patinet's PT note from today. CSW contacted [facility] Medicare case Freight forwarder and spoke to [place]. Per Marton Redwood Medicare SNF authorization is still good for Friday and patient can admit to [facility] on Friday. [place] admissions coordinator at [facility] is aware of above. CSW met with patient and made her aware of above. CSW will continue to follow and assist as needed.   [employer], LCSW [phone]

## 2017-01-25 ENCOUNTER — Observation Stay: Payer: Medicare Other

## 2017-01-25 ENCOUNTER — Encounter: Payer: Self-pay | Admitting: Interventional Radiology

## 2017-01-25 DIAGNOSIS — Z7401 Bed confinement status: Secondary | ICD-10-CM | POA: Diagnosis not present

## 2017-01-25 DIAGNOSIS — S32110S Nondisplaced Zone I fracture of sacrum, sequela: Secondary | ICD-10-CM | POA: Diagnosis not present

## 2017-01-25 DIAGNOSIS — R6889 Other general symptoms and signs: Secondary | ICD-10-CM | POA: Diagnosis not present

## 2017-01-25 DIAGNOSIS — M816 Localized osteoporosis [Lequesne]: Secondary | ICD-10-CM | POA: Diagnosis not present

## 2017-01-25 DIAGNOSIS — S3210XA Unspecified fracture of sacrum, initial encounter for closed fracture: Secondary | ICD-10-CM | POA: Diagnosis not present

## 2017-01-25 DIAGNOSIS — Z791 Long term (current) use of non-steroidal anti-inflammatories (NSAID): Secondary | ICD-10-CM | POA: Diagnosis not present

## 2017-01-25 DIAGNOSIS — M47817 Spondylosis without myelopathy or radiculopathy, lumbosacral region: Secondary | ICD-10-CM | POA: Diagnosis not present

## 2017-01-25 DIAGNOSIS — M1991 Primary osteoarthritis, unspecified site: Secondary | ICD-10-CM | POA: Diagnosis not present

## 2017-01-25 DIAGNOSIS — Z79899 Other long term (current) drug therapy: Secondary | ICD-10-CM | POA: Diagnosis not present

## 2017-01-25 DIAGNOSIS — R531 Weakness: Secondary | ICD-10-CM | POA: Diagnosis not present

## 2017-01-25 DIAGNOSIS — I10 Essential (primary) hypertension: Secondary | ICD-10-CM | POA: Diagnosis not present

## 2017-01-25 DIAGNOSIS — R278 Other lack of coordination: Secondary | ICD-10-CM | POA: Diagnosis not present

## 2017-01-25 DIAGNOSIS — M810 Age-related osteoporosis without current pathological fracture: Secondary | ICD-10-CM | POA: Diagnosis not present

## 2017-01-25 DIAGNOSIS — R262 Difficulty in walking, not elsewhere classified: Secondary | ICD-10-CM | POA: Diagnosis not present

## 2017-01-25 DIAGNOSIS — E876 Hypokalemia: Secondary | ICD-10-CM | POA: Diagnosis not present

## 2017-01-25 DIAGNOSIS — Z7409 Other reduced mobility: Secondary | ICD-10-CM | POA: Diagnosis not present

## 2017-01-25 DIAGNOSIS — S32110D Nondisplaced Zone I fracture of sacrum, subsequent encounter for fracture with routine healing: Secondary | ICD-10-CM | POA: Diagnosis not present

## 2017-01-25 DIAGNOSIS — M5116 Intervertebral disc disorders with radiculopathy, lumbar region: Secondary | ICD-10-CM | POA: Diagnosis not present

## 2017-01-25 DIAGNOSIS — M129 Arthropathy, unspecified: Secondary | ICD-10-CM | POA: Diagnosis not present

## 2017-01-25 DIAGNOSIS — M48061 Spinal stenosis, lumbar region without neurogenic claudication: Secondary | ICD-10-CM | POA: Diagnosis not present

## 2017-01-25 DIAGNOSIS — M549 Dorsalgia, unspecified: Secondary | ICD-10-CM | POA: Diagnosis not present

## 2017-01-25 DIAGNOSIS — M5416 Radiculopathy, lumbar region: Secondary | ICD-10-CM | POA: Diagnosis not present

## 2017-01-25 DIAGNOSIS — M6281 Muscle weakness (generalized): Secondary | ICD-10-CM | POA: Diagnosis not present

## 2017-01-25 DIAGNOSIS — R2689 Other abnormalities of gait and mobility: Secondary | ICD-10-CM | POA: Diagnosis not present

## 2017-01-25 HISTORY — PX: IR FLUORO GUIDED NEEDLE PLC ASPIRATION/INJECTION LOC: IMG2395

## 2017-01-25 MED ORDER — SODIUM CHLORIDE 0.9 % IJ SOLN
INTRAMUSCULAR | Status: AC
  Filled 2017-01-25: qty 50

## 2017-01-25 MED ORDER — METHYLPREDNISOLONE ACETATE 80 MG/ML IJ SUSP
INTRAMUSCULAR | Status: AC
  Filled 2017-01-25: qty 2

## 2017-01-25 MED ORDER — LIDOCAINE HCL (PF) 1 % IJ SOLN
INTRAMUSCULAR | Status: AC
  Filled 2017-01-25: qty 30

## 2017-01-25 MED ORDER — OXYCODONE-ACETAMINOPHEN 5-325 MG PO TABS: 1.0000 | ORAL_TABLET | ORAL | 0 refills | Status: DC | PRN

## 2017-01-25 MED ORDER — METHYLPREDNISOLONE ACETATE 80 MG/ML IJ SUSP
160.0000 mg | Freq: Once | INTRAMUSCULAR | Status: DC
  Filled 2017-01-25: qty 2

## 2017-01-25 NOTE — Discharge Summary (Signed)
[place] at [place] NAME: Elizabeth Fernandez    MR#:  [id number]  DATE OF BIRTH:  14-Nov-1933  DATE OF ADMISSION:  01/21/2017 ADMITTING PHYSICIAN: Vaughan Basta, MD  DATE OF DISCHARGE: 01/25/2017  PRIMARY CARE PHYSICIAN: Burnard Hawthorne, FNP   ADMISSION DIAGNOSIS:  Pain [R52] Radicular pain [M54.10] Left leg pain [M79.605] Sacral fracture (HCC) [S32.10XA]  DISCHARGE DIAGNOSIS:  Principal Problem:   Back pain Active Problems:   Sacral fracture (HCC)   SECONDARY DIAGNOSIS:   Past Medical History:  Diagnosis Date  . Arthritis    both knees  . Cataract    Surgery scheduled for 03/2014  . Medical history non-contributory      ADMITTING HISTORY  HISTORY OF PRESENT ILLNESS: Elizabeth Fernandez  is a 83 y.o. female with a known history of arthritis, cataract- takes ibuprofen daily, able to walk without difficulty 1 month ago. Then - she had pain in her right side hip and it resolved in 2-3 days- and started having pain in left hip. It was sharp, constant- but fluctuating in intensity. No relieving factors. Radiating to her left calf. Denies associated numbness and weakness in leg- but due to severe pain- she could not walk and so finally came to ER.  HOSPITAL COURSE:   * Back pain with Functional decline and subacute sacral fracture.   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 No surgery needed for the sacral fracture per orthopedics. Neurosurgery Dr. Cari Caraway has recommending L4-5 epidural steroid injection. DR.Sudini discussed with interventional radiology Dr. Vernard Gambles. Patient Had epidural steroid injection today tolerated the procedure well, discharge to skilled nursing facility  * hypokalemia Replaced   * osteoporosis Cont vit D and calcium.  Stable for discharge to SNF.  CONSULTS OBTAINED:  Treatment Team:  Leola Brazil, MD  DRUG ALLERGIES:   Allergies  Allergen  Reactions  . Pollen Extract     DISCHARGE MEDICATIONS:   Current Discharge Medication List    START taking these medications   Details  oxyCODONE-acetaminophen (PERCOCET/ROXICET) 5-325 MG tablet Take 1 tablet by mouth every 4 (four) hours as needed for severe pain. Qty: 20 tablet, Refills: 0      CONTINUE these medications which have NOT CHANGED   Details  calcium-vitamin D (OSCAL WITH D) 500-200 MG-UNIT per tablet Take 1 tablet by mouth 2 (two) times daily. Reported on 10/06/2015    docusate calcium (SURFAK) 240 MG capsule Take 240 mg by mouth at bedtime. Reported on 10/06/2015    meloxicam (MOBIC) 7.5 MG tablet Take 1 tablet (7.5 mg total) by mouth daily. Take with food. Qty: 90 tablet, Refills: 1   Associated Diagnoses: Acute right-sided low back pain without sciatica      STOP taking these medications     ibuprofen (ADVIL,MOTRIN) 200 MG tablet      traMADol (ULTRAM) 50 MG tablet      HYDROcodone-acetaminophen (NORCO/VICODIN) 5-325 MG tablet      predniSONE (DELTASONE) 10 MG tablet         Today   VITAL SIGNS:  Blood pressure 136/68, pulse 72, temperature 98.2 F (36.8 C), temperature source Oral, resp. rate 16, height 5\' 4"  (1.626 m), weight 69.9 kg (154 lb), SpO2 98 %.  I/O:    Intake/Output Summary (Last 24 hours) at 01/25/17 1505 Last data filed at 01/24/17 1901  Gross per 24 hour  Intake  240 ml  Output                0 ml  Net              240 ml    PHYSICAL EXAMINATION:  Physical Exam  GENERAL:  83 y.o.-year-old patient lying in the bed with no acute distress.  LUNGS: Normal breath sounds bilaterally, no wheezing, rales,rhonchi or crepitation. No use of accessory muscles of respiration.  CARDIOVASCULAR: S1, S2 normal. No murmurs, rubs, or gallops.  ABDOMEN: Soft, non-tender, non-distended. Bowel sounds present. No organomegaly or mass.  NEUROLOGIC: Moves all 4 extremities. PSYCHIATRIC: The patient is alert and oriented x 3.  SKIN: No  obvious rash, lesion, or ulcer.   DATA REVIEW:   CBC  Recent Labs Lab 01/22/17 0429  WBC 8.3  HGB 13.5  HCT 39.2  PLT 268    Chemistries   Recent Labs Lab 01/22/17 0429  NA 140  K 4.7  CL 101  CO2 32  GLUCOSE 86  BUN 22*  CREATININE 0.74  CALCIUM 8.5*    Cardiac Enzymes No results for input(s): TROPONINI in the last 168 hours.  Microbiology Results  No results found for this or any previous visit.  RADIOLOGY:  No results found.  Follow up with PCP in 1 week.  Management plans discussed with the patient, family and they are in agreement.  CODE STATUS:     Code Status Orders        Start     Ordered   01/21/17 1400  Full code  Continuous     01/21/17 1359    Code Status History    Date Active Date Inactive Code Status Order ID Comments User Context   This patient has a current code status but no historical code status.      TOTAL TIME TAKING CARE OF THIS PATIENT ON DAY OF DISCHARGE: 43  minutes.   Nicholes Mango M.D on 01/25/2017 at 3:05 PM  Between 7am to 6pm - Pager - [phone]   After 6pm go to [email or web address] - password [email or web address] [facility]  Office  [phone]  CC: Primary care physician; Burnard Hawthorne, FNP  Note: This dictation was prepared with Dragon dictation along with smaller phrase technology. Any transcriptional errors that result from this process are unintentional.

## 2017-01-25 NOTE — Clinical Social Work Placement (Signed)
   CLINICAL SOCIAL WORK PLACEMENT  NOTE  Date:  01/25/2017  Patient Details  Name: TYTIONNA CLOYD MRN: [id number] Date of Birth: [birth year]  Clinical Social Work is seeking post-discharge placement for this patient at the [place] level of care (*CSW will initial, date and re-position this form in  chart as items are completed):  Yes   Patient/family provided with [facility] Work Department's list of facilities offering this level of care within the geographic area requested by the patient (or if unable, by the patient's family).  Yes   Patient/family informed of their freedom to choose among providers that offer the needed level of care, that participate in Medicare, Medicaid or managed care program needed by the patient, have an available bed and are willing to accept the patient.  Yes   Patient/family informed of [facility]'s ownership interest in [facility] and [facility], as well as of the fact that they are under no obligation to receive care at these facilities.  PASRR submitted to EDS on       PASRR number received on       Existing PASRR number confirmed on 01/22/17     FL2 transmitted to all facilities in geographic area requested by pt/family on 01/22/17     FL2 transmitted to all facilities within larger geographic area on       Patient informed that his/her managed care company has contracts with or will negotiate with certain facilities, including the following:        Yes   Patient/family informed of bed offers received.  Patient chooses bed at  [facility] )     Physician recommends and patient chooses bed at      Patient to be transferred to  [facility] ) on 01/25/17.  Patient to be transferred to facility by  [facility] )     Patient family notified on 01/25/17 of transfer.  Name of family member notified:   (Patient's son Audreana Hancox is aware of D/C today. )     PHYSICIAN       Additional  Comment:    _______________________________________________ Cordarrel Stiefel, Veronia Beets, LCSW 01/25/2017, 3:45 PM

## 2017-01-25 NOTE — Progress Notes (Signed)
Patient is medically stable for D/C to [facility] today. Per [facility] admissions coordinator at [facility] patient can come today to room 501. RN will call report and arrange EMS for transport. [facility] Medicare SNF authorization has been received. Clinical Education officer, museum (CSW) sent D/C orders to [employer] via [place]. Patient is aware of above. CSW contacted patient's son Elizabeth Fernandez and made him aware of above. Please reconsult if future social work needs arise. CSW signing off.   [employer], LCSW [phone]

## 2017-01-25 NOTE — Progress Notes (Signed)
Took over care for patient at 1500 today. Getting discharged to [facility]. Report called. IV removed with cath intact. Waiting on EMS to transport. Patient made aware. Patient packed and ready to go.

## 2017-01-28 ENCOUNTER — Telehealth: Payer: Self-pay | Admitting: Family

## 2017-01-28 NOTE — Telephone Encounter (Signed)
Lennette Bihari wanted to let Joycelyn Schmid know that they have moved patient to [facility].  Patient has two Follow ups after rehab she has to do then she will come see margaret for a follow up.

## 2017-01-28 NOTE — Telephone Encounter (Signed)
Noted! Thank you

## 2017-01-28 NOTE — Telephone Encounter (Signed)
Pt son called and would like a call regarding update.   Call son @ [phone]. Thank you!

## 2017-01-31 DIAGNOSIS — M810 Age-related osteoporosis without current pathological fracture: Secondary | ICD-10-CM | POA: Diagnosis not present

## 2017-01-31 DIAGNOSIS — Z7409 Other reduced mobility: Secondary | ICD-10-CM | POA: Diagnosis not present

## 2017-01-31 DIAGNOSIS — M5416 Radiculopathy, lumbar region: Secondary | ICD-10-CM | POA: Diagnosis not present

## 2017-01-31 DIAGNOSIS — R531 Weakness: Secondary | ICD-10-CM | POA: Diagnosis not present

## 2017-02-04 NOTE — Telephone Encounter (Signed)
Please advise, thanks.

## 2017-02-04 NOTE — Telephone Encounter (Signed)
Pt son called wanting to know if Joycelyn Schmid can speak to the doctor at [facility] just to give a update on her medication and other things like her progress. Pt son would like a call before the call is made to [facility]. Thank you!  Miguel Dibble @ [phone].

## 2017-02-05 DIAGNOSIS — R531 Weakness: Secondary | ICD-10-CM | POA: Diagnosis not present

## 2017-02-05 DIAGNOSIS — M810 Age-related osteoporosis without current pathological fracture: Secondary | ICD-10-CM | POA: Diagnosis not present

## 2017-02-05 DIAGNOSIS — M5416 Radiculopathy, lumbar region: Secondary | ICD-10-CM | POA: Diagnosis not present

## 2017-02-05 NOTE — Telephone Encounter (Signed)
Elizabeth Fernandez,  Would you call [facility] [phone] and ask for any updates to faxed to [place]?   The physician may also call us with any updates.   Please then call son and let him know that we have placed call for updates.

## 2017-02-05 NOTE — Telephone Encounter (Signed)
Elizabeth Fernandez has been informed and will send the updates as soon as they can. Son has been left a VM to return call back.

## 2017-02-05 NOTE — Telephone Encounter (Signed)
[place] stated that patient will be using facility providers

## 2017-02-07 NOTE — Telephone Encounter (Signed)
FYI

## 2017-02-08 DIAGNOSIS — R531 Weakness: Secondary | ICD-10-CM | POA: Diagnosis not present

## 2017-02-08 DIAGNOSIS — Z7409 Other reduced mobility: Secondary | ICD-10-CM | POA: Diagnosis not present

## 2017-02-08 DIAGNOSIS — M810 Age-related osteoporosis without current pathological fracture: Secondary | ICD-10-CM | POA: Diagnosis not present

## 2017-02-08 DIAGNOSIS — M5416 Radiculopathy, lumbar region: Secondary | ICD-10-CM | POA: Diagnosis not present

## 2017-02-14 ENCOUNTER — Telehealth: Payer: Self-pay | Admitting: Family

## 2017-02-14 DIAGNOSIS — M5416 Radiculopathy, lumbar region: Secondary | ICD-10-CM | POA: Diagnosis not present

## 2017-02-14 NOTE — Telephone Encounter (Signed)
Call pt  My concern is her recent immobilization and risk for DVT.  She needs to be seen today.    Advise urgent care if no appts here.   If she has SOB, palpitations, I would then advise ED .

## 2017-02-14 NOTE — Telephone Encounter (Signed)
Spoke to patient, she stated that her leg is not swollen big just enough where you can see a line where her sock was.  She refused to go to UC/ED today. She stated she does feel up to it due to her rehab appointment today.  She did however say that if she is told tomorrow by her rehab to go get seen she will go to UC/ED.  Patient did however schedule appointment with Joycelyn Schmid on Monday if she decides not to go to UC/ED.

## 2017-02-14 NOTE — Telephone Encounter (Signed)
Pt called back returning your call. Please advise, thank you!  Call pt @ [phone]

## 2017-02-14 NOTE — Telephone Encounter (Signed)
Left message for patient to return call back.  

## 2017-02-14 NOTE — Telephone Encounter (Signed)
FYI

## 2017-02-14 NOTE — Telephone Encounter (Signed)
Pt lvm requesting information on if she was seen by Joycelyn Schmid regarding her left leg swelling. We have not seen her for leg swelling, just leg pain. I called her back to get more information from her. I asked if she had fever in her leg or pain. She said that her PT said she did last week when she saw them.  She said that she has an appointment with Dr. Cari Caraway this morning-her neurosurgeon at 10:00 am, and is going to ask if he can look at her leg. I told her if he does not to let us know asap and we will try to get her evaluated today.

## 2017-02-15 NOTE — Telephone Encounter (Signed)
Pt son called and has some question about his mother..please advise.. didn't see his name on [place] [phone]

## 2017-02-18 ENCOUNTER — Ambulatory Visit (INDEPENDENT_AMBULATORY_CARE_PROVIDER_SITE_OTHER): Payer: Medicare Other | Admitting: Family

## 2017-02-18 ENCOUNTER — Telehealth: Payer: Self-pay | Admitting: Internal Medicine

## 2017-02-18 ENCOUNTER — Ambulatory Visit
Admission: RE | Admit: 2017-02-18 | Discharge: 2017-02-18 | Disposition: A | Discharge status: Home / Self Care | Payer: Medicare Other | Source: Ambulatory Visit | Attending: Family | Admitting: Family

## 2017-02-18 ENCOUNTER — Encounter: Payer: Self-pay | Admitting: Emergency Medicine

## 2017-02-18 ENCOUNTER — Encounter: Payer: Self-pay | Admitting: Family

## 2017-02-18 VITALS — BP 110/64 | HR 88 | Temp 98.1°F | Ht 64.0 in | Wt 146.2 lb

## 2017-02-18 DIAGNOSIS — M810 Age-related osteoporosis without current pathological fracture: Secondary | ICD-10-CM | POA: Diagnosis not present

## 2017-02-18 DIAGNOSIS — Z91048 Other nonmedicinal substance allergy status: Secondary | ICD-10-CM | POA: Diagnosis not present

## 2017-02-18 DIAGNOSIS — S3210XD Unspecified fracture of sacrum, subsequent encounter for fracture with routine healing: Secondary | ICD-10-CM | POA: Insufficient documentation

## 2017-02-18 DIAGNOSIS — Z79899 Other long term (current) drug therapy: Secondary | ICD-10-CM | POA: Insufficient documentation

## 2017-02-18 DIAGNOSIS — I700 Atherosclerosis of aorta: Secondary | ICD-10-CM | POA: Diagnosis not present

## 2017-02-18 DIAGNOSIS — R0902 Hypoxemia: Secondary | ICD-10-CM | POA: Insufficient documentation

## 2017-02-18 DIAGNOSIS — Y95 Nosocomial condition: Secondary | ICD-10-CM | POA: Insufficient documentation

## 2017-02-18 DIAGNOSIS — M7989 Other specified soft tissue disorders: Secondary | ICD-10-CM | POA: Diagnosis not present

## 2017-02-18 DIAGNOSIS — M170 Bilateral primary osteoarthritis of knee: Secondary | ICD-10-CM | POA: Diagnosis not present

## 2017-02-18 DIAGNOSIS — J40 Bronchitis, not specified as acute or chronic: Secondary | ICD-10-CM

## 2017-02-18 DIAGNOSIS — J189 Pneumonia, unspecified organism: Secondary | ICD-10-CM | POA: Insufficient documentation

## 2017-02-18 DIAGNOSIS — R0602 Shortness of breath: Secondary | ICD-10-CM | POA: Diagnosis not present

## 2017-02-18 DIAGNOSIS — E559 Vitamin D deficiency, unspecified: Secondary | ICD-10-CM | POA: Insufficient documentation

## 2017-02-18 DIAGNOSIS — Z9049 Acquired absence of other specified parts of digestive tract: Secondary | ICD-10-CM | POA: Diagnosis not present

## 2017-02-18 DIAGNOSIS — E039 Hypothyroidism, unspecified: Secondary | ICD-10-CM | POA: Diagnosis not present

## 2017-02-18 DIAGNOSIS — J449 Chronic obstructive pulmonary disease, unspecified: Secondary | ICD-10-CM | POA: Insufficient documentation

## 2017-02-18 DIAGNOSIS — Z780 Asymptomatic menopausal state: Secondary | ICD-10-CM | POA: Diagnosis not present

## 2017-02-18 DIAGNOSIS — I82432 Acute embolism and thrombosis of left popliteal vein: Secondary | ICD-10-CM | POA: Diagnosis not present

## 2017-02-18 DIAGNOSIS — X58XXXD Exposure to other specified factors, subsequent encounter: Secondary | ICD-10-CM | POA: Insufficient documentation

## 2017-02-18 DIAGNOSIS — I82492 Acute embolism and thrombosis of other specified deep vein of left lower extremity: Secondary | ICD-10-CM | POA: Diagnosis not present

## 2017-02-18 MED ORDER — LEVOFLOXACIN 500 MG PO TABS: 500.0000 mg | ORAL_TABLET | Freq: Every day | ORAL | 0 refills | Status: DC

## 2017-02-18 MED ORDER — BENZONATATE 100 MG PO CAPS: 100.0000 mg | ORAL_CAPSULE | Freq: Two times a day (BID) | ORAL | 0 refills | Status: DC | PRN

## 2017-02-18 MED ORDER — IOPAMIDOL (ISOVUE-370) INJECTION 76%
75.0000 mL | Freq: Once | INTRAVENOUS | Status: AC | PRN
  Administered 2017-02-18: 75 mL via INTRAVENOUS

## 2017-02-18 NOTE — Telephone Encounter (Signed)
CT scan showed no embolism (no blood clot)  But did show a pneumonia so she needs to start an antibiotic  Which I have called in to [facility] [address] .  levaquin 500 mg daily x 7 days   She also needs to have a repeat CT of the chest in 6 months because she has some abnormal changes in the right upper lob which may be due to the infection ,  But need to be followed To make sure they resolve.    Please take a probiotic ( Align, Floraque or Culturelle), the generic version of one of these over the counter medications,  for a minimum of 3 weeks to prevent a serious antibiotic associated diarrhea  Called clostridium dificile colitis.  Taking a probiotic may also prevent vaginitis due to yeast infections and can be continued indefinitely if you feel that it improves your digestion or your elimination (bowels). [facility] a [facility] generic probiotics

## 2017-02-18 NOTE — Addendum Note (Signed)
Addended byElpidio Galea T on: 02/18/2017 04:17 PM   Modules accepted: Orders

## 2017-02-18 NOTE — Telephone Encounter (Signed)
Patient notified of medication and will start antibiotics today, and will also get probiotics to prevent from C-diff, also made sure she f/u ct of chest in 6 months

## 2017-02-18 NOTE — Progress Notes (Signed)
Subjective:    Patient ID: Elizabeth Fernandez, female    DOB: September 08, 1933, 83 y.o.   MRN: [id number]  CC: Elizabeth Fernandez is a 83 y.o. female who presents today for an acute visit.    HPI: Here after [facility] rehab 2 weeks over night and now 3 days per week since home for sacral fracture. Using walker now, not cane.   Scheduled to se [facility] 04/01/17. Pending epidural injection with [facility].   Still taking mobic with some relief. Not taking the roxicet, as felt sleepy on medication.   Notes left leg swelling which started last week, smaller today. Noticed at therapy.  Also notes cough x 4 days ago. Endorses fatigue, nasal congestion, wheezing, sob with ambulation. NO ear pain.  Using mucinex with relief. Since hospitalization. , activity level has decreased.  No CP.   No h/o dvt, h/o malignancy.   Would like handicap sticker  Osteoporosis-some problems with swallowing calcium pills. No foods getting stuck in throat. No upcoming dental procedures.       HISTORY:  Past Medical History:  Diagnosis Date  . Arthritis    both knees  . Cataract    Surgery scheduled for 03/2014  . Medical history non-contributory    Past Surgical History:  Procedure Laterality Date  . CHOLECYSTECTOMY    . FEMUR SURGERY  11/10/2014  . IR FLUORO GUIDED NEEDLE PLC ASPIRATION/INJECTION LOC  01/25/2017   Family History  Problem Relation Age of Onset  . Cancer Mother   . Heart disease Father     Allergies: Pollen extract Current Outpatient Prescriptions on File Prior to Visit  Medication Sig Dispense Refill  . calcium-vitamin D (OSCAL WITH D) 500-200 MG-UNIT per tablet Take 1 tablet by mouth 2 (two) times daily. Reported on 10/06/2015    . docusate calcium (SURFAK) 240 MG capsule Take 240 mg by mouth at bedtime. Reported on 10/06/2015    . meloxicam (MOBIC) 7.5 MG tablet Take 1 tablet (7.5 mg total) by mouth daily. Take with food. 90 tablet 1  . oxyCODONE-acetaminophen (PERCOCET/ROXICET) 5-325 MG  tablet Take 1 tablet by mouth every 4 (four) hours as needed for severe pain. 20 tablet 0   No current facility-administered medications on file prior to visit.     Social History  Substance Use Topics  . Smoking status: Never Smoker  . Smokeless tobacco: Never Used  . Alcohol use No    Review of Systems  Constitutional: Positive for fatigue. Negative for chills and fever.  HENT: Positive for congestion. Negative for sinus pain, sinus pressure and sore throat.   Respiratory: Positive for shortness of breath. Negative for cough and wheezing.   Cardiovascular: Positive for leg swelling. Negative for chest pain and palpitations.  Gastrointestinal: Negative for nausea and vomiting.      Objective:    BP 110/64   Pulse 88   Temp 98.1 F (36.7 C) (Oral)   Ht 5\' 4"  (1.626 m)   Wt 146 lb 3.2 oz (66.3 kg)   SpO2 98%   BMI 25.10 kg/m    Physical Exam  Constitutional: She appears well-developed and well-nourished.  HENT:  Head: Normocephalic and atraumatic.  Right Ear: Hearing, tympanic membrane, external ear and ear canal normal. No drainage, swelling or tenderness. No foreign bodies. Tympanic membrane is not erythematous and not bulging. No middle ear effusion. No decreased hearing is noted.  Left Ear: Hearing, tympanic membrane, external ear and ear canal normal. No drainage, swelling or tenderness. No  foreign bodies. Tympanic membrane is not erythematous and not bulging.  No middle ear effusion. No decreased hearing is noted.  Nose: Nose normal. No rhinorrhea. Right sinus exhibits no maxillary sinus tenderness and no frontal sinus tenderness. Left sinus exhibits no maxillary sinus tenderness and no frontal sinus tenderness.  Mouth/Throat: Uvula is midline, oropharynx is clear and moist and mucous membranes are normal. No oropharyngeal exudate, posterior oropharyngeal edema, posterior oropharyngeal erythema or tonsillar abscesses.  Eyes: Conjunctivae are normal.  Cardiovascular:  Regular rhythm, normal heart sounds and normal pulses.   Trace LLE edema. No palpable cords or masses. No erythema or increased warmth. Slight  asymmetry in calf size when compared bilaterally L> R. LE hair growth symmetric and present. No discoloration of varicosities noted. LE warm and palpable pedal pulses.   Pulmonary/Chest: Effort normal and breath sounds normal. She has no wheezes. She has no rhonchi. She has no rales.  Lymphadenopathy:       Head (right side): No submental, no submandibular, no tonsillar, no preauricular, no posterior auricular and no occipital adenopathy present.       Head (left side): No submental, no submandibular, no tonsillar, no preauricular, no posterior auricular and no occipital adenopathy present.    She has no cervical adenopathy.  Neurological: She is alert.  Skin: Skin is warm and dry.  Psychiatric: She has a normal mood and affect. Her speech is normal and behavior is normal. Thought content normal.  Vitals reviewed.      Assessment & Plan:   Problem List Items Addressed This Visit      Respiratory   Bronchitis    SaO2 98%. HR 88. Due to recent immobilization with sacral fracture, patient jointly agreed to pursue further evaluation to ensure no pulmonary embolism. Pending stat CT angiogram and left lower extremity ultrasound. Working diagnosis of viral bronchitis. Patient and I jointly agreed on conservative management at this time. Tessalon Perles given. Return precautions given.      Relevant Medications   benzonatate (TESSALON) 100 MG capsule     Musculoskeletal and Integument   Osteoporosis    We discussed at length the benefits and risk of Fosamax  Patient has some difficulty with larger pills however no difficulty swallowing regular food. We decided today that we would focus on shortness of breath, swelling in legs, and a follow-up to further address treatment of osteoporosis        Other   Left leg swelling - Primary    Trace.  However based on recent immobilization, pending stat ultrasound.      Relevant Orders   CT ANGIO CHEST PE W OR WO CONTRAST   US Venous Img Lower Unilateral Left         I am having Ms. Wardell start on benzonatate. I am also having her maintain her calcium-vitamin D, docusate calcium, meloxicam, and oxyCODONE-acetaminophen.   Meds ordered this encounter  Medications  . benzonatate (TESSALON) 100 MG capsule    Sig: Take 1 capsule (100 mg total) by mouth 2 (two) times daily as needed for cough.    Dispense:  20 capsule    Refill:  0    Order Specific Question:   Supervising Provider    Answer:   Crecencio Mc [[id number]]    Return precautions given.   Risks, benefits, and alternatives of the medications and treatment plan prescribed today were discussed, and patient expressed understanding.   Education regarding symptom management and diagnosis given to patient on AVS.  Continue  to follow with Burnard Hawthorne, FNP for routine health maintenance.   Cassandria Santee and I agreed with plan.   Mable Paris, FNP

## 2017-02-18 NOTE — ED Triage Notes (Signed)
Pt arrived to the ED accompanied by family after being referred by her PCP for having a DVT on her left leg and pneumonia. Pt states that she was called by her PCP to come to the ED after receiving the results of an MRI that showed that the Pt has a DVT on the left leg. Pt is AOx4 in no apparent distress.

## 2017-02-18 NOTE — Assessment & Plan Note (Addendum)
SaO2 98%. HR 88. Due to recent immobilization with sacral fracture, patient jointly agreed to pursue further evaluation to ensure no pulmonary embolism. Pending stat CT angiogram and left lower extremity ultrasound. Working diagnosis of viral bronchitis. Patient and I jointly agreed on conservative management at this time. Tessalon Perles given. Return precautions given.

## 2017-02-18 NOTE — Progress Notes (Signed)
Pre visit review using our clinic review tool, if applicable. No additional management support is needed unless otherwise documented below in the visit note. 

## 2017-02-18 NOTE — Assessment & Plan Note (Signed)
We discussed at length the benefits and risk of Fosamax  Patient has some difficulty with larger pills however no difficulty swallowing regular food. We decided today that we would focus on shortness of breath, swelling in legs, and a follow-up to further address treatment of osteoporosis

## 2017-02-18 NOTE — Patient Instructions (Signed)
Stat CT, ultrasound of leg  Continue mucinex  Start tessalon as needed for cough  Will call you tonight with results so keep phone on you

## 2017-02-18 NOTE — Assessment & Plan Note (Signed)
Trace. However based on recent immobilization, pending stat ultrasound.

## 2017-02-19 ENCOUNTER — Telehealth: Payer: Self-pay | Admitting: Family

## 2017-02-19 ENCOUNTER — Observation Stay
Admission: EM | Admit: 2017-02-19 | Discharge: 2017-02-20 | Disposition: A | Discharge status: Home / Self Care | Payer: Medicare Other | Attending: Specialist | Admitting: Specialist

## 2017-02-19 ENCOUNTER — Encounter: Payer: Self-pay | Admitting: Internal Medicine

## 2017-02-19 DIAGNOSIS — J189 Pneumonia, unspecified organism: Secondary | ICD-10-CM | POA: Diagnosis present

## 2017-02-19 DIAGNOSIS — E0590 Thyrotoxicosis, unspecified without thyrotoxic crisis or storm: Secondary | ICD-10-CM | POA: Diagnosis not present

## 2017-02-19 DIAGNOSIS — I82409 Acute embolism and thrombosis of unspecified deep veins of unspecified lower extremity: Secondary | ICD-10-CM | POA: Diagnosis not present

## 2017-02-19 DIAGNOSIS — I82492 Acute embolism and thrombosis of other specified deep vein of left lower extremity: Secondary | ICD-10-CM

## 2017-02-19 DIAGNOSIS — E559 Vitamin D deficiency, unspecified: Secondary | ICD-10-CM | POA: Diagnosis not present

## 2017-02-19 LAB — BASIC METABOLIC PANEL
ANION GAP: 8 (ref 5–15)
BUN: 20 mg/dL (ref 6–20)
CALCIUM: 8.6 mg/dL — AB (ref 8.9–10.3)
CO2: 27 mmol/L (ref 22–32)
Chloride: 104 mmol/L (ref 101–111)
Creatinine, Ser: 0.76 mg/dL (ref 0.44–1.00)
GFR calc non Af Amer: >60 mL/min (ref >60)
Glucose, Bld: 96 mg/dL (ref 65–99)
Potassium: 4.6 mmol/L (ref 3.5–5.1)
Sodium: 139 mmol/L (ref 135–145)

## 2017-02-19 LAB — TSH: TSH: 7.857 u[IU]/mL — ABNORMAL HIGH (ref 0.350–4.500)

## 2017-02-19 LAB — CBC WITH DIFFERENTIAL/PLATELET
BASOS ABS: 0 10*3/uL (ref 0–0.1)
BASOS PCT: 1 %
EOS PCT: 4 %
Eosinophils Absolute: 0.2 10*3/uL (ref 0–0.7)
HCT: 38.2 % (ref 35.0–47.0)
Hemoglobin: 13 g/dL (ref 12.0–16.0)
Lymphocytes Relative: 22 %
Lymphs Abs: 1.2 10*3/uL (ref 1.0–3.6)
MCH: 30.7 pg (ref 26.0–34.0)
MCHC: 33.9 g/dL (ref 32.0–36.0)
MCV: 90.4 fL (ref 80.0–100.0)
MONO ABS: 0.6 10*3/uL (ref 0.2–0.9)
Monocytes Relative: 11 %
Neutro Abs: 3.4 10*3/uL (ref 1.4–6.5)
Neutrophils Relative %: 62 %
PLATELETS: 172 10*3/uL (ref 150–440)
RBC: 4.23 MIL/uL (ref 3.80–5.20)
RDW: 14 % (ref 11.5–14.5)
WBC: 5.4 10*3/uL (ref 3.6–11.0)

## 2017-02-19 LAB — TROPONIN I: Troponin I: <0.03 ng/mL (ref <0.03)

## 2017-02-19 LAB — MRSA PCR SCREENING: MRSA by PCR: NEGATIVE

## 2017-02-19 LAB — LACTIC ACID, PLASMA: LACTIC ACID, VENOUS: 0.9 mmol/L (ref 0.5–1.9)

## 2017-02-19 MED ORDER — BENZONATATE 100 MG PO CAPS: 100.0000 mg | ORAL_CAPSULE | Freq: Two times a day (BID) | ORAL | Status: DC | PRN

## 2017-02-19 MED ORDER — ENSURE ENLIVE PO LIQD
237.0000 mL | Freq: Three times a day (TID) | ORAL | Status: DC
  Administered 2017-02-19: 237 mL via ORAL

## 2017-02-19 MED ORDER — RIVAROXABAN 20 MG PO TABS: 20.0000 mg | ORAL_TABLET | Freq: Every day | ORAL | Status: DC

## 2017-02-19 MED ORDER — VANCOMYCIN HCL IN DEXTROSE 1-5 GM/200ML-% IV SOLN
1000.0000 mg | Freq: Once | INTRAVENOUS | Status: AC
  Administered 2017-02-19: 1000 mg via INTRAVENOUS
  Filled 2017-02-19: qty 200

## 2017-02-19 MED ORDER — DEXTROSE 5 % IV SOLN
1.0000 g | Freq: Two times a day (BID) | INTRAVENOUS | Status: DC
  Filled 2017-02-19 (×2): qty 1

## 2017-02-19 MED ORDER — VANCOMYCIN HCL IN DEXTROSE 750-5 MG/150ML-% IV SOLN
750.0000 mg | INTRAVENOUS | Status: DC
  Filled 2017-02-19: qty 150

## 2017-02-19 MED ORDER — ALBUTEROL SULFATE (2.5 MG/3ML) 0.083% IN NEBU: 2.5000 mg | INHALATION_SOLUTION | RESPIRATORY_TRACT | Status: DC | PRN

## 2017-02-19 MED ORDER — VANCOMYCIN HCL IN DEXTROSE 750-5 MG/150ML-% IV SOLN
750.0000 mg | Freq: Two times a day (BID) | INTRAVENOUS | Status: DC
  Filled 2017-02-19 (×2): qty 150

## 2017-02-19 MED ORDER — RIVAROXABAN 15 MG PO TABS
15.0000 mg | ORAL_TABLET | Freq: Once | ORAL | Status: AC
Start: 2017-02-19 — End: 2017-02-19
  Administered 2017-02-19: 15 mg via ORAL
  Filled 2017-02-19: qty 1

## 2017-02-19 MED ORDER — CALCIUM CARBONATE-VITAMIN D 500-200 MG-UNIT PO TABS
1.0000 | ORAL_TABLET | Freq: Two times a day (BID) | ORAL | Status: DC
  Administered 2017-02-19 – 2017-02-20 (×3): 1 via ORAL
  Filled 2017-02-19 (×4): qty 1

## 2017-02-19 MED ORDER — RIVAROXABAN 15 MG PO TABS
15.0000 mg | ORAL_TABLET | Freq: Two times a day (BID) | ORAL | Status: DC
  Administered 2017-02-19 – 2017-02-20 (×2): 15 mg via ORAL
  Filled 2017-02-19 (×3): qty 1

## 2017-02-19 MED ORDER — DEXTROSE 5 % IV SOLN
2.0000 g | INTRAVENOUS | Status: DC
  Administered 2017-02-19 – 2017-02-20 (×2): 2 g via INTRAVENOUS
  Filled 2017-02-19 (×4): qty 2

## 2017-02-19 NOTE — Progress Notes (Signed)
Initial Nutrition Assessment  DOCUMENTATION CODES:   Severe malnutrition in context of acute illness/injury  INTERVENTION:  Continue Ensure Enlive po TID, each supplement provides 350 kcal and 20 grams of protein. May be able to decrease pending patient's PO intake.  Provide Magic cup BID with lunch and dinner, each supplement provides 290 kcal and 9 grams of protein.   Encouraged adequate intake of calories and protein to prevent further unintentional weight loss. Discussed importance of good nutritional status to help prevent future falls.  NUTRITION DIAGNOSIS:   Malnutrition (Severe) related to acute illness (recent fall with sacral fracture) as evidenced by 9.1 percent weight loss over 3 months, moderate depletions of muscle mass, moderate depletion of body fat.  GOAL:   Patient will meet greater than or equal to 90% of their needs  MONITOR:   PO intake, Supplement acceptance, Labs, Weight trends, I & O's  REASON FOR ASSESSMENT:   Malnutrition Screening Tool    ASSESSMENT:   83 year old female with PMHx of osteoarthritis, recent fall with sacral fracture who presented with left lower extremity swelling and found to have DVT, PNA.   Spoke with patient at bedside. She reports her appetite has been decreased since her fall approximately 6 weeks ago. She reports she is not eating anything, but on further questioning is having some intake, it is just decreased from baseline. She had 1/2 of her omelette and hash browns at breakfast and finished all of her orange juice. Per chart she finished 75% of her lunch.   Patient reports UBW 160 lbs. Per chart she was 160.8 lbs on 11/19/2016 and has lost 14.6 lbs (9.1% body weight) over the past 3 months, which is significant for time frame.   Meal Completion: 75% of lunch today per chart  Medications reviewed and include: Oscal with D 1 tablet BID, cefepime.  Labs reviewed and none pertinent.  Nutrition-Focused physical exam completed.  Findings are moderate fat depletion, moderate muscle depletion, and non-pitting edema.   Diet Order:  Diet regular Room service appropriate? Yes; Fluid consistency: Thin  Skin:  Reviewed, no issues  Last BM:  PTA (02/18/2017)  Height:   Ht Readings from Last 1 Encounters:  02/18/17 5\' 4"  (1.626 m)    Weight:   Wt Readings from Last 1 Encounters:  02/18/17 146 lb (66.2 kg)    Ideal Body Weight:  54.5 kg  BMI:  Body mass index is 25.06 kg/m.  Estimated Nutritional Needs:   Kcal:  1650-1850 (25-28 kcal/kg)  Protein:  80-93 grams (1.2-1.4 grams/kg)  Fluid:  1.6 L/day (25 ml/kg)  EDUCATION NEEDS:   Education needs addressed  Willey Blade, MS, RD, LDN Pager: [phone] After Hours Pager: [phone]

## 2017-02-19 NOTE — Progress Notes (Signed)
[facility] [place] NAME: Elizabeth Fernandez    MR#:  [id number]  DATE OF BIRTH:  [birth year]  SUBJECTIVE:   Patient here due to her left lower extremity DVT and also noted to have pneumonia on CT chest. Complains of generalized weakness. No shortness of breath, hemoptysis. Left lower extremity edema has improved.  REVIEW OF SYSTEMS:    Review of Systems  Constitutional: Negative for chills and fever.  HENT: Negative for congestion and tinnitus.   Eyes: Negative for blurred vision and double vision.  Respiratory: Negative for cough, shortness of breath and wheezing.   Cardiovascular: Positive for leg swelling. Negative for chest pain, orthopnea and PND.  Gastrointestinal: Negative for abdominal pain, diarrhea, nausea and vomiting.  Genitourinary: Negative for dysuria and hematuria.  Neurological: Positive for weakness. Negative for dizziness, sensory change and focal weakness.  All other systems reviewed and are negative.   Nutrition: Regular Tolerating Diet: Yes Tolerating PT: Ambulatory  DRUG ALLERGIES:   Allergies  Allergen Reactions  . Pollen Extract     VITALS:  Blood pressure (!) 103/56, pulse 98, temperature 98.3 F (36.8 C), temperature source Oral, resp. rate (!) 22, height 5\' 4"  (1.626 m), weight 66.2 kg (146 lb), SpO2 98 %.  PHYSICAL EXAMINATION:   Physical Exam  GENERAL:  83 y.o.-year-old patient lying in bed in no acute distress.  EYES: Pupils equal, round, reactive to light and accommodation. No scleral icterus. Extraocular muscles intact.  HEENT: Head atraumatic, normocephalic. Oropharynx and nasopharynx clear.  NECK:  Supple, no jugular venous distention. No thyroid enlargement, no tenderness.  LUNGS: Normal breath sounds bilaterally, no wheezing, rales, rhonchi. No use of accessory muscles of respiration.  CARDIOVASCULAR: S1, S2 normal. No murmurs, rubs, or gallops.  ABDOMEN: Soft, nontender, nondistended. Bowel  sounds present. No organomegaly or mass.  EXTREMITIES: No cyanosis, clubbing, + 1 edema Left > right.  NEUROLOGIC: Cranial nerves II through XII are intact. No focal Motor or sensory deficits b/l.   PSYCHIATRIC: The patient is alert and oriented x 3.  SKIN: No obvious rash, lesion, or ulcer.    LABORATORY PANEL:   CBC  Recent Labs Lab 02/19/17 0134  WBC 5.4  HGB 13.0  HCT 38.2  PLT 172   ------------------------------------------------------------------------------------------------------------------  Chemistries   Recent Labs Lab 02/19/17 0134  NA 139  K 4.6  CL 104  CO2 27  GLUCOSE 96  BUN 20  CREATININE 0.76  CALCIUM 8.6*   ------------------------------------------------------------------------------------------------------------------  Cardiac Enzymes  Recent Labs Lab 02/19/17 0134  TROPONINI <0.03   ------------------------------------------------------------------------------------------------------------------  RADIOLOGY:  Ct Angio Chest Pe W Or Wo Contrast  Result Date: 02/18/2017 CLINICAL DATA:  Shortness of breath. Cough for the past 4 days. Left leg swelling. Previous cholecystectomy. EXAM: CT ANGIOGRAPHY CHEST WITH CONTRAST TECHNIQUE: Multidetector CT imaging of the chest was performed using the standard protocol during bolus administration of intravenous contrast. Multiplanar CT image reconstructions and MIPs were obtained to evaluate the vascular anatomy. CONTRAST:  75 cc Isovue 370 COMPARISON:  Chest radiograph dated 11/10/2014. FINDINGS: Cardiovascular: Satisfactory opacification of the pulmonary arteries to the segmental level. No evidence of pulmonary embolism. Normal heart size. No pericardial effusion. Small amount of aortic calcification. Normal sized heart. Mediastinum/Nodes: No enlarged mediastinal, hilar, or axillary lymph nodes. Thyroid gland, trachea, and esophagus demonstrate no significant findings. Lungs/Pleura: The lungs are mildly  hyperexpanded with mild diffuse peribronchial thickening. There is a small amount of focal patchy opacity in the left lower lobe  on image number 60 of series 6, measuring 2.8 x 1.8 cm. There is also an area of focal bullous changes or cavitation in the apical portion of the right upper lobe with some associated soft tissue density measuring 2.8 x 1.9 cm on image number 10 of series 6. Mild left apical pleural and parenchymal scarring is also. Upper Abdomen: Intrahepatic biliary ductal dilatation with a left hepatic duct diameter of 10 mm. Musculoskeletal: Thoracic spine degenerative changes and mild to moderate dextroconvex scoliosis. Review of the MIP images confirms the above findings. IMPRESSION: 1. Left lower lobe airspace opacity, suspicious for pneumonia. 2. Right apical focal bullous changes or multiloculated cavitation with a small amount of associated density, most likely representing post infectious changes. A neoplastic process is much less likely. This could be followed with a repeat chest CT without contrast in 6 months. 3. No pulmonary emboli seen. 4. Intrahepatic biliary ductal dilatation. This is most likely normal for a patient of this age who has had a previous cholecystectomy. Biliary obstruction is less likely. 5. Mild changes of COPD and chronic bronchitis. 6. Mild calcified aortic atherosclerosis. Aortic Atherosclerosis (ICD10-I70.0) and Emphysema (ICD10-J43.9). Electronically Signed   By: Claudie Revering M.D.   On: 02/18/2017 17:03   US Venous Img Lower Unilateral Left  Addendum Date: 02/18/2017   ADDENDUM REPORT: 02/18/2017 18:22 ADDENDUM: Critical Value/emergent results were called by telephone at the time of interpretation on 02/18/2017 at 6:15 pm to Dr. Deborra Medina , who verbally acknowledged these results. Patient will be escorted to the emergency room as requested by Dr. Derrel Nip. Electronically Signed   By: Jeannine Boga M.D.   On: 02/18/2017 18:22   Result Date:  02/18/2017 CLINICAL DATA:  Initial evaluation for acute left leg swelling. EXAM: Left LOWER EXTREMITY VENOUS DOPPLER ULTRASOUND TECHNIQUE: Gray-scale sonography with graded compression, as well as color Doppler and duplex ultrasound were performed to evaluate the lower extremity deep venous systems from the level of the common femoral vein and including the common femoral, femoral, profunda femoral, popliteal and calf veins including the posterior tibial, peroneal and gastrocnemius veins when visible. The superficial great saphenous vein was also interrogated. Spectral Doppler was utilized to evaluate flow at rest and with distal augmentation maneuvers in the common femoral, femoral and popliteal veins. COMPARISON:  Prior ultrasound from 11/19/2016. FINDINGS: Contralateral Common Femoral Vein: Respiratory phasicity is normal and symmetric with the symptomatic side. No evidence of thrombus. Normal compressibility. Common Femoral Vein: No evidence of thrombus. Normal compressibility, respiratory phasicity and response to augmentation. Saphenofemoral Junction: No evidence of thrombus. Normal compressibility and flow on color Doppler imaging. Profunda Femoral Vein: No evidence of thrombus. Normal compressibility and flow on color Doppler imaging. Femoral Vein: No evidence of thrombus. Normal compressibility, respiratory phasicity and response to augmentation. Popliteal Vein: Echogenic occlusive thrombus within the distal left popliteal vein, extending into the posterior tibial vein. Loss of normal compressibility. Calf Veins: Echogenic thrombus within knee left posterior tibial vein, extending from the left popliteal vein. Left peroneal vein not definitely visualized. Superficial Great Saphenous Vein: No evidence of thrombus. Normal compressibility and flow on color Doppler imaging. Other Findings:  None. IMPRESSION: Positive study with occlusive DVT in the left popliteal vein, extending into the proximal left posterior  tibial vein. Electronically Signed: By: Jeannine Boga M.D. On: 02/18/2017 18:07     ASSESSMENT AND PLAN:   83 year old female with past medical history of osteoarthritis, recent fall with a sacral fracture who presents to the hospital due to  left lower extremity swelling and noted to have a DVT.   1. Left lower extremity DVT-provoked DVT given her recent sacral fracture and poor mobility. - cont. Xarelto.  - CT chest (-) for acute PE.  Pt. Is not hypoxic.   2. Pneumonia - incidentally noted to CT chest yesterday.  - no cough, fever, or elevated WBC count.  - cont. IV vancomycin, cefepime. We'll get MRSA PCR if negative we'll DC vancomycin. Clinically stable.   All the records are reviewed and case discussed with Care Management/Social Worker. Management plans discussed with the patient, family and they are in agreement.  CODE STATUS: Full  DVT Prophylaxis: Xarelto  TOTAL TIME TAKING CARE OF THIS PATIENT: 30 minutes.   POSSIBLE D/C IN 1-2 DAYS, DEPENDING ON CLINICAL CONDITION.   Henreitta Leber M.D on 02/19/2017 at 2:30 PM  Between 7am to 6pm - Pager - [phone]  After 6pm go to [email or web address] - [employer] [facility]  Office  [phone]  CC: Primary care physician; Burnard Hawthorne, FNP

## 2017-02-19 NOTE — ED Notes (Addendum)
Nurse attempted 1 IV and was unsuccessful and Dr. Beather Arbour in room talking to patient.

## 2017-02-19 NOTE — ED Notes (Signed)
Assisted pt with ambulation in room. O2 93%  HR 112

## 2017-02-19 NOTE — H&P (Addendum)
Elizabeth Fernandez is an 83 y.o. female.   Chief Complaint: Cough HPI: The patient with past medical history of arthritis presents emergency department complaining of cough. The patient had recently been discharged from the hospital following a sacral fracture. She completed 2 weeks of skilled rehabilitation when today one of her physical therapist noticed that her leg may be swollen. She was found to have a DVT in her left leg. Her primary care doctor requested that she come to the hospital where CTA of the chest was performed. It did not demonstrate pulmonary embolism but did show a left lower lobe pneumonia. The patient admits to nonproductive cough for approximately one week and weakness, the latter of which was profound today. The patient was started on antibiotics before the hospitalist service was called for admission.  Past Medical History:  Diagnosis Date  . Arthritis    both knees  . Cataract    Surgery scheduled for 03/2014    Past Surgical History:  Procedure Laterality Date  . CHOLECYSTECTOMY    . FEMUR SURGERY  11/10/2014  . IR FLUORO GUIDED NEEDLE PLC ASPIRATION/INJECTION LOC  01/25/2017    Family History  Problem Relation Age of Onset  . Cancer Mother   . Heart disease Father    Social History:  reports that she has never smoked. She has never used smokeless tobacco. She reports that she does not drink alcohol or use drugs.  Allergies:  Allergies  Allergen Reactions  . Pollen Extract     Medications Prior to Admission  Medication Sig Dispense Refill  . benzonatate (TESSALON) 100 MG capsule Take 1 capsule (100 mg total) by mouth 2 (two) times daily as needed for cough. 20 capsule 0  . calcium-vitamin D (OSCAL WITH D) 500-200 MG-UNIT per tablet Take 1 tablet by mouth 2 (two) times daily. Reported on 10/06/2015    . levofloxacin (LEVAQUIN) 500 MG tablet Take 1 tablet (500 mg total) by mouth daily. 7 tablet 0  . meloxicam (MOBIC) 7.5 MG tablet Take 1 tablet (7.5 mg total) by  mouth daily. Take with food. 90 tablet 1    Results for orders placed or performed during the hospital encounter of 02/19/17 (from the past 48 hour(s))  Culture, blood (routine x 2)     Status: None (Preliminary result)   Collection Time: 02/19/17  1:34 AM  Result Value Ref Range   Specimen Description BLOOD LT AC    Special Requests      BOTTLES DRAWN AEROBIC AND ANAEROBIC Blood Culture adequate volume   Culture NO GROWTH < 12 HOURS    Report Status PENDING   CBC with Differential     Status: None   Collection Time: 02/19/17  1:34 AM  Result Value Ref Range   WBC 5.4 3.6 - 11.0 K/uL   RBC 4.23 3.80 - 5.20 MIL/uL   Hemoglobin 13.0 12.0 - 16.0 g/dL   HCT 38.2 35.0 - 47.0 %   MCV 90.4 80.0 - 100.0 fL   MCH 30.7 26.0 - 34.0 pg   MCHC 33.9 32.0 - 36.0 g/dL   RDW 14.0 11.5 - 14.5 %   Platelets 172 150 - 440 K/uL   Neutrophils Relative % 62 %   Neutro Abs 3.4 1.4 - 6.5 K/uL   Lymphocytes Relative 22 %   Lymphs Abs 1.2 1.0 - 3.6 K/uL   Monocytes Relative 11 %   Monocytes Absolute 0.6 0.2 - 0.9 K/uL   Eosinophils Relative 4 %   Eosinophils  Absolute 0.2 0 - 0.7 K/uL   Basophils Relative 1 %   Basophils Absolute 0.0 0 - 0.1 K/uL  Basic metabolic panel     Status: Abnormal   Collection Time: 02/19/17  1:34 AM  Result Value Ref Range   Sodium 139 135 - 145 mmol/L   Potassium 4.6 3.5 - 5.1 mmol/L   Chloride 104 101 - 111 mmol/L   CO2 27 22 - 32 mmol/L   Glucose, Bld 96 65 - 99 mg/dL   BUN 20 6 - 20 mg/dL   Creatinine, Ser 0.76 0.44 - 1.00 mg/dL   Calcium 8.6 (L) 8.9 - 10.3 mg/dL   GFR calc non Af Amer >60 >60 mL/min   GFR calc Af Amer >60 >60 mL/min    Comment: (NOTE) The eGFR has been calculated using the CKD EPI equation. This calculation has not been validated in all clinical situations. eGFR's persistently <60 mL/min signify possible Chronic Kidney Disease.    Anion gap 8 5 - 15  Troponin I     Status: None   Collection Time: 02/19/17  1:34 AM  Result Value Ref Range    Troponin I <0.03 <0.03 ng/mL  Lactic acid, plasma     Status: None   Collection Time: 02/19/17  1:34 AM  Result Value Ref Range   Lactic Acid, Venous 0.9 0.5 - 1.9 mmol/L  TSH     Status: Abnormal   Collection Time: 02/19/17  1:34 AM  Result Value Ref Range   TSH 7.857 (H) 0.350 - 4.500 uIU/mL    Comment: Performed by a 3rd Generation assay with a functional sensitivity of <=0.01 uIU/mL.  Culture, blood (routine x 2)     Status: None (Preliminary result)   Collection Time: 02/19/17  2:40 AM  Result Value Ref Range   Specimen Description BLOOD LT FOREARM    Special Requests      BOTTLES DRAWN AEROBIC AND ANAEROBIC Blood Culture results may not be optimal due to an excessive volume of blood received in culture bottles   Culture NO GROWTH < 12 HOURS    Report Status PENDING    Ct Angio Chest Pe W Or Wo Contrast  Result Date: 02/18/2017 CLINICAL DATA:  Shortness of breath. Cough for the past 4 days. Left leg swelling. Previous cholecystectomy. EXAM: CT ANGIOGRAPHY CHEST WITH CONTRAST TECHNIQUE: Multidetector CT imaging of the chest was performed using the standard protocol during bolus administration of intravenous contrast. Multiplanar CT image reconstructions and MIPs were obtained to evaluate the vascular anatomy. CONTRAST:  75 cc Isovue 370 COMPARISON:  Chest radiograph dated 11/10/2014. FINDINGS: Cardiovascular: Satisfactory opacification of the pulmonary arteries to the segmental level. No evidence of pulmonary embolism. Normal heart size. No pericardial effusion. Small amount of aortic calcification. Normal sized heart. Mediastinum/Nodes: No enlarged mediastinal, hilar, or axillary lymph nodes. Thyroid gland, trachea, and esophagus demonstrate no significant findings. Lungs/Pleura: The lungs are mildly hyperexpanded with mild diffuse peribronchial thickening. There is a small amount of focal patchy opacity in the left lower lobe on image number 60 of series 6, measuring 2.8 x 1.8 cm. There is  also an area of focal bullous changes or cavitation in the apical portion of the right upper lobe with some associated soft tissue density measuring 2.8 x 1.9 cm on image number 10 of series 6. Mild left apical pleural and parenchymal scarring is also. Upper Abdomen: Intrahepatic biliary ductal dilatation with a left hepatic duct diameter of 10 mm. Musculoskeletal: Thoracic spine degenerative changes  and mild to moderate dextroconvex scoliosis. Review of the MIP images confirms the above findings. IMPRESSION: 1. Left lower lobe airspace opacity, suspicious for pneumonia. 2. Right apical focal bullous changes or multiloculated cavitation with a small amount of associated density, most likely representing post infectious changes. A neoplastic process is much less likely. This could be followed with a repeat chest CT without contrast in 6 months. 3. No pulmonary emboli seen. 4. Intrahepatic biliary ductal dilatation. This is most likely normal for a patient of this age who has had a previous cholecystectomy. Biliary obstruction is less likely. 5. Mild changes of COPD and chronic bronchitis. 6. Mild calcified aortic atherosclerosis. Aortic Atherosclerosis (ICD10-I70.0) and Emphysema (ICD10-J43.9). Electronically Signed   By: Claudie Revering M.D.   On: 02/18/2017 17:03   US Venous Img Lower Unilateral Left  Addendum Date: 02/18/2017   ADDENDUM REPORT: 02/18/2017 18:22 ADDENDUM: Critical Value/emergent results were called by telephone at the time of interpretation on 02/18/2017 at 6:15 pm to Dr. Deborra Medina , who verbally acknowledged these results. Patient will be escorted to the emergency room as requested by Dr. Derrel Nip. Electronically Signed   By: Jeannine Boga M.D.   On: 02/18/2017 18:22   Result Date: 02/18/2017 CLINICAL DATA:  Initial evaluation for acute left leg swelling. EXAM: Left LOWER EXTREMITY VENOUS DOPPLER ULTRASOUND TECHNIQUE: Gray-scale sonography with graded compression, as well as color Doppler  and duplex ultrasound were performed to evaluate the lower extremity deep venous systems from the level of the common femoral vein and including the common femoral, femoral, profunda femoral, popliteal and calf veins including the posterior tibial, peroneal and gastrocnemius veins when visible. The superficial great saphenous vein was also interrogated. Spectral Doppler was utilized to evaluate flow at rest and with distal augmentation maneuvers in the common femoral, femoral and popliteal veins. COMPARISON:  Prior ultrasound from 11/19/2016. FINDINGS: Contralateral Common Femoral Vein: Respiratory phasicity is normal and symmetric with the symptomatic side. No evidence of thrombus. Normal compressibility. Common Femoral Vein: No evidence of thrombus. Normal compressibility, respiratory phasicity and response to augmentation. Saphenofemoral Junction: No evidence of thrombus. Normal compressibility and flow on color Doppler imaging. Profunda Femoral Vein: No evidence of thrombus. Normal compressibility and flow on color Doppler imaging. Femoral Vein: No evidence of thrombus. Normal compressibility, respiratory phasicity and response to augmentation. Popliteal Vein: Echogenic occlusive thrombus within the distal left popliteal vein, extending into the posterior tibial vein. Loss of normal compressibility. Calf Veins: Echogenic thrombus within knee left posterior tibial vein, extending from the left popliteal vein. Left peroneal vein not definitely visualized. Superficial Great Saphenous Vein: No evidence of thrombus. Normal compressibility and flow on color Doppler imaging. Other Findings:  None. IMPRESSION: Positive study with occlusive DVT in the left popliteal vein, extending into the proximal left posterior tibial vein. Electronically Signed: By: Jeannine Boga M.D. On: 02/18/2017 18:07    Review of Systems  Constitutional: Negative for chills and fever.  HENT: Negative for sore throat and tinnitus.    Eyes: Negative for blurred vision and redness.  Respiratory: Positive for cough. Negative for shortness of breath.   Cardiovascular: Positive for leg swelling. Negative for chest pain, palpitations, orthopnea and PND.  Gastrointestinal: Positive for nausea. Negative for abdominal pain, diarrhea and vomiting.  Genitourinary: Negative for dysuria, frequency and urgency.  Musculoskeletal: Negative for joint pain and myalgias.  Skin: Negative for rash.       No lesions  Neurological: Negative for speech change, focal weakness and weakness.  Endo/Heme/Allergies: Does  not bruise/bleed easily.       No temperature intolerance  Psychiatric/Behavioral: Negative for depression and suicidal ideas.    Blood pressure (!) 145/65, pulse 98, temperature 98.4 F (36.9 C), temperature source Oral, resp. rate (!) 22, height '5\' 4"'$  (1.626 m), weight 66.2 kg (146 lb), SpO2 95 %. Physical Exam  Vitals reviewed. Constitutional: She is oriented to person, place, and time. She appears well-developed and well-nourished. No distress.  HENT:  Head: Normocephalic and atraumatic.  Mouth/Throat: Oropharynx is clear and moist.  Eyes: Pupils are equal, round, and reactive to light. Conjunctivae and EOM are normal. No scleral icterus.  Neck: Normal range of motion. Neck supple. No JVD present. No tracheal deviation present. No thyromegaly present.  Cardiovascular: Normal rate, regular rhythm and normal heart sounds.  Exam reveals no gallop and no friction rub.   No murmur heard. Respiratory: Effort normal and breath sounds normal.  GI: Soft. Bowel sounds are normal. She exhibits no distension. There is no tenderness.  Genitourinary:  Genitourinary Comments: Deferred  Musculoskeletal: Normal range of motion. She exhibits edema.  Lymphadenopathy:    She has no cervical adenopathy.  Neurological: She is alert and oriented to person, place, and time. No cranial nerve deficit. She exhibits normal muscle tone.  Skin:  Skin is warm and dry. No rash noted. No erythema.  Psychiatric: She has a normal mood and affect. Judgment and thought content normal.     Assessment/Plan This is an 82 year old female admitted for  pneumonia. 1. Pneumonia: healthcare associated; mild hypoxia with ambulation. The patient does not meet criteria for sepsis. She was started on cefepime and vancomycin. Ordered MRSA PCR. Blood cultures and sputum sample ordered.  Discontinue vancomycin and transitioned to oral antibiotics if negative. Supplemental oxygen as needed. Albuterol as needed 2. DVT: Nonocclusive. Start Xarelto 3. Hypothyroidism: Check T3 and T4. Start Synthroid. 4. Vitamin D deficiency: Supplement diet 5. GI prophylaxis: None The patient is a full code. Time spent on admission orders and patient care approximately 45 minutes  Harrie Foreman, MD 02/19/2017, 7:18 AM

## 2017-02-19 NOTE — Progress Notes (Signed)
Patient is AxOx4 with forgetfulness at times. Able to ambulated to BR with 1 assist and cane. Xarelto resumed this shift for left leg DVT. Some swelling but not significant. Encourage incentive spirometer, 250 achieved. Lives at home with son.

## 2017-02-19 NOTE — ED Provider Notes (Signed)
[facility] Emergency Department Provider Note   ____________________________________________   First MD Initiated Contact with Patient 02/19/17 0107     (approximate)  I have reviewed the triage vital signs and the nursing notes.   HISTORY  Chief Complaint DVT and Pneumonia    HPI Elizabeth Fernandez is a 83 y.o. female sent to the ED by her PCP for left lower extremity DVT and pneumonia. Patient was discharged 2 weeks ago from rehabilitation status post sacral fracture. Over the weekend she noted cough and physical therapist noted swelling to her left lower leg. She had outpatient CT scan which was negative for pulmonary embolus but noted left-sided pneumonia. She was started on Levaquin and had her first dose last night. She was called by her PCP later in the afternoon for positive left leg DVT seen on ultrasound and instructed to proceed to the ER. Patient denies fever, chills, chest pain, shortness of breath, abdominal pain, nausea, vomiting. States she is now able to ambulate with her cane and her mobility has significantly improved.   Past Medical History:  Diagnosis Date  . Arthritis    both knees  . Cataract    Surgery scheduled for 03/2014  . Medical history non-contributory     Patient Active Problem List   Diagnosis Date Noted  . Bronchitis 02/18/2017  . Left leg swelling 02/18/2017  . Back pain 01/21/2017  . Sacral fracture ([place]) 01/21/2017  . Acute left-sided low back pain without sciatica 01/15/2017  . Osteoporosis 01/01/2017  . Leg swelling 11/19/2016  . Postmenopausal estrogen deficiency 11/19/2016  . Cataract 10/06/2015  . Situational anxiety 08/02/2015  . Hemorrhagic eye 12/18/2014  . Encounter to establish care 12/09/2014  . History of fracture of leg 12/09/2014    Past Surgical History:  Procedure Laterality Date  . CHOLECYSTECTOMY    . FEMUR SURGERY  11/10/2014  . IR FLUORO GUIDED NEEDLE PLC ASPIRATION/INJECTION LOC   01/25/2017    Prior to Admission medications   Medication Sig Start Date End Date Taking? Authorizing Provider  benzonatate (TESSALON) 100 MG capsule Take 1 capsule (100 mg total) by mouth 2 (two) times daily as needed for cough. 02/18/17  Yes Arnett, Yvetta Coder, FNP  calcium-vitamin D (OSCAL WITH D) 500-200 MG-UNIT per tablet Take 1 tablet by mouth 2 (two) times daily. Reported on 10/06/2015   Yes [provider]  levofloxacin (LEVAQUIN) 500 MG tablet Take 1 tablet (500 mg total) by mouth daily. 02/18/17  Yes Crecencio Mc, MD  meloxicam (MOBIC) 7.5 MG tablet Take 1 tablet (7.5 mg total) by mouth daily. Take with food. 12/25/16  Yes Burnard Hawthorne, FNP    Allergies Pollen extract  Family History  Problem Relation Age of Onset  . Cancer Mother   . Heart disease Father     Social History Social History  Substance Use Topics  . Smoking status: Never Smoker  . Smokeless tobacco: Never Used  . Alcohol use No    Review of Systems  Constitutional: No fever/chills. Eyes: No visual changes. ENT: No sore throat. Cardiovascular: Denies chest pain. Respiratory: Positive for nonproductive cough. Denies shortness of breath. Gastrointestinal: No abdominal pain.  No nausea, no vomiting.  No diarrhea.  No constipation. Genitourinary: Negative for dysuria. Musculoskeletal: Positive for left leg pain and swelling. Negative for back pain. Skin: Negative for rash. Neurological: Negative for headaches, focal weakness or numbness.   ____________________________________________   PHYSICAL EXAM:  VITAL SIGNS: ED Triage Vitals  Enc  Vitals Group     BP 02/18/17 2118 124/71     Pulse Rate 02/18/17 2118 (!) 109     Resp --      Temp --      Temp Source 02/18/17 2118 Oral     SpO2 02/18/17 2118 94 %     Weight 02/18/17 2118 146 lb (66.2 kg)     Height 02/18/17 2118 5\' 4"  (1.626 m)     Head Circumference --      Peak Flow --      Pain Score 02/18/17 2125 3     Pain Loc --       Pain Edu? --      Excl. in [place]? --     Constitutional: Alert and oriented. Well appearing and in no acute distress. Eyes: Conjunctivae are normal. PERRL. EOMI. Head: Atraumatic. Nose: No congestion/rhinnorhea. Mouth/Throat: Mucous membranes are moist.  Oropharynx non-erythematous. Neck: No stridor.   Cardiovascular: Normal rate, regular rhythm. Grossly normal heart sounds.  Good peripheral circulation. Respiratory: Normal respiratory effort.  No retractions. Lungs CTAB. Gastrointestinal: Soft and nontender. No distention. No abdominal bruits. No CVA tenderness. Musculoskeletal: Right calf and left calf both measure 34 cm. Left calf is supple without evidence for compartment syndrome. No lower extremity edema.  2+ distal pulses. No joint effusions. Neurologic:  Normal speech and language. No gross focal neurologic deficits are appreciated.  Skin:  Skin is warm, dry and intact. No rash noted. Psychiatric: Mood and affect are normal. Speech and behavior are normal.  ____________________________________________   LABS (all labs ordered are listed, but only abnormal results are displayed)  Labs Reviewed  BASIC METABOLIC PANEL - Abnormal; Notable for the following:       Result Value   Calcium 8.6 (*)    All other components within normal limits  CULTURE, BLOOD (ROUTINE X 2)  CULTURE, BLOOD (ROUTINE X 2)  CBC WITH DIFFERENTIAL/PLATELET  TROPONIN I  LACTIC ACID, PLASMA   ____________________________________________  EKG  ED ECG REPORT I, SUNG,JADE J, the attending physician, personally viewed and interpreted this ECG.   Date: 02/19/2017  EKG Time: 0152  Rate: 85  Rhythm: normal EKG, normal sinus rhythm  Axis: LAD  Intervals:none  ST&T Change: Nonspecific  ____________________________________________  RADIOLOGY  Ct Angio Chest Pe W Or Wo Contrast  Result Date: 02/18/2017 CLINICAL DATA:  Shortness of breath. Cough for the past 4 days. Left leg swelling. Previous  cholecystectomy. EXAM: CT ANGIOGRAPHY CHEST WITH CONTRAST TECHNIQUE: Multidetector CT imaging of the chest was performed using the standard protocol during bolus administration of intravenous contrast. Multiplanar CT image reconstructions and MIPs were obtained to evaluate the vascular anatomy. CONTRAST:  75 cc Isovue 370 COMPARISON:  Chest radiograph dated 11/10/2014. FINDINGS: Cardiovascular: Satisfactory opacification of the pulmonary arteries to the segmental level. No evidence of pulmonary embolism. Normal heart size. No pericardial effusion. Small amount of aortic calcification. Normal sized heart. Mediastinum/Nodes: No enlarged mediastinal, hilar, or axillary lymph nodes. Thyroid gland, trachea, and esophagus demonstrate no significant findings. Lungs/Pleura: The lungs are mildly hyperexpanded with mild diffuse peribronchial thickening. There is a small amount of focal patchy opacity in the left lower lobe on image number 60 of series 6, measuring 2.8 x 1.8 cm. There is also an area of focal bullous changes or cavitation in the apical portion of the right upper lobe with some associated soft tissue density measuring 2.8 x 1.9 cm on image number 10 of series 6. Mild left apical pleural and parenchymal  scarring is also. Upper Abdomen: Intrahepatic biliary ductal dilatation with a left hepatic duct diameter of 10 mm. Musculoskeletal: Thoracic spine degenerative changes and mild to moderate dextroconvex scoliosis. Review of the MIP images confirms the above findings. IMPRESSION: 1. Left lower lobe airspace opacity, suspicious for pneumonia. 2. Right apical focal bullous changes or multiloculated cavitation with a small amount of associated density, most likely representing post infectious changes. A neoplastic process is much less likely. This could be followed with a repeat chest CT without contrast in 6 months. 3. No pulmonary emboli seen. 4. Intrahepatic biliary ductal dilatation. This is most likely normal  for a patient of this age who has had a previous cholecystectomy. Biliary obstruction is less likely. 5. Mild changes of COPD and chronic bronchitis. 6. Mild calcified aortic atherosclerosis. Aortic Atherosclerosis (ICD10-I70.0) and Emphysema (ICD10-J43.9). Electronically Signed   By: Claudie Revering M.D.   On: 02/18/2017 17:03   US Venous Img Lower Unilateral Left  Addendum Date: 02/18/2017   ADDENDUM REPORT: 02/18/2017 18:22 ADDENDUM: Critical Value/emergent results were called by telephone at the time of interpretation on 02/18/2017 at 6:15 pm to Dr. Deborra Medina , who verbally acknowledged these results. Patient will be escorted to the emergency room as requested by Dr. Derrel Nip. Electronically Signed   By: Jeannine Boga M.D.   On: 02/18/2017 18:22   Result Date: 02/18/2017 CLINICAL DATA:  Initial evaluation for acute left leg swelling. EXAM: Left LOWER EXTREMITY VENOUS DOPPLER ULTRASOUND TECHNIQUE: Gray-scale sonography with graded compression, as well as color Doppler and duplex ultrasound were performed to evaluate the lower extremity deep venous systems from the level of the common femoral vein and including the common femoral, femoral, profunda femoral, popliteal and calf veins including the posterior tibial, peroneal and gastrocnemius veins when visible. The superficial great saphenous vein was also interrogated. Spectral Doppler was utilized to evaluate flow at rest and with distal augmentation maneuvers in the common femoral, femoral and popliteal veins. COMPARISON:  Prior ultrasound from 11/19/2016. FINDINGS: Contralateral Common Femoral Vein: Respiratory phasicity is normal and symmetric with the symptomatic side. No evidence of thrombus. Normal compressibility. Common Femoral Vein: No evidence of thrombus. Normal compressibility, respiratory phasicity and response to augmentation. Saphenofemoral Junction: No evidence of thrombus. Normal compressibility and flow on color Doppler imaging. Profunda  Femoral Vein: No evidence of thrombus. Normal compressibility and flow on color Doppler imaging. Femoral Vein: No evidence of thrombus. Normal compressibility, respiratory phasicity and response to augmentation. Popliteal Vein: Echogenic occlusive thrombus within the distal left popliteal vein, extending into the posterior tibial vein. Loss of normal compressibility. Calf Veins: Echogenic thrombus within knee left posterior tibial vein, extending from the left popliteal vein. Left peroneal vein not definitely visualized. Superficial Great Saphenous Vein: No evidence of thrombus. Normal compressibility and flow on color Doppler imaging. Other Findings:  None. IMPRESSION: Positive study with occlusive DVT in the left popliteal vein, extending into the proximal left posterior tibial vein. Electronically Signed: By: Jeannine Boga M.D. On: 02/18/2017 18:07    ____________________________________________   PROCEDURES  Procedure(s) performed: None  Procedures  Critical Care performed: No  ____________________________________________   INITIAL IMPRESSION / ASSESSMENT AND PLAN / ED COURSE  Pertinent labs & imaging results that were available during my care of the patient were reviewed by me and considered in my medical decision making (see chart for details).  83 year old female who presents to the ED at the urging of her PCP for pneumonia and left lower leg DVT. Patient is not quite  sure why she could not wait to be seen in the office today. She would rather be discharged home. We discussed obtaining screening lab work and ambulating her with pulse oximeter. If those are reassuring and patient does not meet hospitalization criteria, will discharge home on Xarelto for DVT.  Clinical Course as of Feb 20 324  Tue Feb 19, 2017  0324 Patient ambulated with pulse oximeter; room air saturations 93%, patient was tachycardic to 117 and tachypneic. Discussed with hospitalist to evaluate patient in  emergency department for admission.  [JS]    Clinical Course User Index [JS] Paulette Blanch, MD     ____________________________________________   FINAL CLINICAL IMPRESSION(S) / ED DIAGNOSES  Final diagnoses:  Acute deep vein thrombosis (DVT) of other specified vein of left lower extremity ([place])  Community acquired pneumonia of left lung, unspecified part of lung      NEW MEDICATIONS STARTED DURING THIS VISIT:  New Prescriptions   No medications on file     Note:  This document was prepared using Dragon voice recognition software and may include unintentional dictation errors.    Paulette Blanch, MD 02/19/17 [phone]

## 2017-02-19 NOTE — Telephone Encounter (Signed)
Call patient at home to discuss results of CT angiogram and also ultrasound. Advised her that she had a left lower extremity blood clot as well as what appeared to be pneumonia. She would require CT scan in 6 months. At this time I advised to get emergency room for further evaluation. She complied. I gave triage to [facility]

## 2017-02-19 NOTE — Progress Notes (Signed)
Pharmacy Antibiotic Note  Elizabeth Fernandez is a 83 y.o. female admitted on 02/19/2017 with pneumonia.  Pharmacy has been consulted for vancomycin dosing.  Plan: Patient received vanc 1g IV x 1  Will f/u w/ vanc 750 mg IV q12h w/ 8 hour stack dose. Will check a vanc trough 7/19 @ 0100 prior to 4th dose. Goal trough 15 - 20 mcg/mL Ke 0.047 T1/2 14 ~ rounding to 12 hour and decreased dose to 750 mg for ease of administration.  Height: 5\' 4"  (162.6 cm) Weight: 146 lb (66.2 kg) IBW/kg (Calculated) : 54.7  Temp (24hrs), Avg:98.3 F (36.8 C), Min:98.1 F (36.7 C), Max:98.4 F (36.9 C)   Recent Labs Lab 02/19/17 0134  WBC 5.4  CREATININE 0.76  LATICACIDVEN 0.9    Estimated Creatinine Clearance: 51.6 mL/min (by C-G formula based on SCr of 0.76 mg/dL).    Allergies  Allergen Reactions  . Pollen Extract      Thank you for allowing pharmacy to be a part of this patient's care.  Tobie Lords, PharmD, BCPS Clinical Pharmacist 02/19/2017

## 2017-02-19 NOTE — Care Management Note (Addendum)
Case Management Note  Patient Details  Name: Elizabeth Fernandez MRN: [id number] Date of Birth: August 18, 1933  Subjective/Objective:  Patient recently at [facility] for a sacral fracture. Discharged to [facility] 01/25/17. Patient was discharged to home where she lives with her son [name] in [place].  She presented to her primary care office 07/16 with Bronchitis and left lower extremity ultrasound with CT. She was called back and told she had a left lower extremity DVT and PNA. She was advised to come to the ED. Admitted with PNA and DVT. Started on Xarelto. Free 30 day coupon given. PCP is Mable Paris @ [facility]. She uses a walker.  No home O2                 Action/Plan: Will follow progression for discharge needs.   Expected Discharge Date:  02/20/17               Expected Discharge Plan:     In-House Referral:     Discharge planning Services  CM Consult  Post Acute Care Choice:    Choice offered to:     DME Arranged:    DME Agency:     HH Arranged:    HH Agency:     Status of Service:  In process, will continue to follow  If discussed at Long Length of Stay Meetings, dates discussed:    Additional Comments:  Jolly Mango, RN 02/19/2017, 9:31 AM

## 2017-02-19 NOTE — ED Notes (Addendum)
Patient called today and told she had DVT and told to come to the ER. Patient also diagnosed with pneumonia and started PO antibiotics today.

## 2017-02-19 NOTE — Care Management Obs Status (Signed)
[place] NOTIFICATION   Patient Details  Name: Elizabeth Fernandez MRN: [id number] Date of Birth: 1934-05-30   Medicare Observation Status Notification Given:  Yes    Jolly Mango, RN 02/19/2017, 8:25 AM

## 2017-02-19 NOTE — Progress Notes (Signed)
Pharmacy Antibiotic Note  Elizabeth Fernandez is a 83 y.o. female admitted on 02/19/2017 with pneumonia.  Pharmacy has been consulted for vancomycin dosing.  Plan: Patient received vanc 1g IV x 1  Will change maintenance dose to vancomycin 750 mg IV q18h based on kinetics. Goal VT 15-20 mcg/mL VT ordered 7/19 @ 1930  MRSA PCR ordered.  Kinetics: Using adjusted body weight = 60 kg, CrCl 51 mL/min Ke: 0.047 Half-life: 15 hrs Cmin (estimated) 15 mcg/mL  Height: 5\' 4"  (162.6 cm) Weight: 146 lb (66.2 kg) IBW/kg (Calculated) : 54.7  Temp (24hrs), Avg:98.3 F (36.8 C), Min:98.1 F (36.7 C), Max:98.4 F (36.9 C)   Recent Labs Lab 02/19/17 0134  WBC 5.4  CREATININE 0.76  LATICACIDVEN 0.9    Estimated Creatinine Clearance: 51.6 mL/min (by C-G formula based on SCr of 0.76 mg/dL).    Allergies  Allergen Reactions  . Pollen Extract     Thank you for allowing pharmacy to be a part of this patient's care.  Lenis Noon, PharmD, BCPS Clinical Pharmacist 02/19/2017

## 2017-02-19 NOTE — Progress Notes (Signed)
ANTICOAGULATION CONSULT NOTE - Initial Consult  Pharmacy Consult for Rivaroxaban Indication: DVT  Allergies  Allergen Reactions  . Pollen Extract    Patient Measurements: Height: 5\' 4"  (162.6 cm) Weight: 146 lb (66.2 kg) IBW/kg (Calculated) : 54.7  Vital Signs: Temp: 98.3 F (36.8 C) (07/17 0749) Temp Source: Oral (07/17 0749) BP: 103/56 (07/17 0749) Pulse Rate: 98 (07/17 0749)  Labs:  Recent Labs  02/19/17 0134  HGB 13.0  HCT 38.2  PLT 172  CREATININE 0.76  TROPONINI <0.03    Estimated Creatinine Clearance: 51.6 mL/min (by C-G formula based on SCr of 0.76 mg/dL).  Medical History: Past Medical History:  Diagnosis Date  . Arthritis    both knees  . Cataract    Surgery scheduled for 03/2014   Assessment: Pharmacy consulted to dose rivaroxaban for treatment of DVT.    Plan:  Rivaroxaban 15 mg PO BID x 21 days followed by rivaroxaban 20 mg PO daily with supper. CBC and SCr to be monitored per protocol.  Lenis Noon, PharmD Clinical Pharmacist 02/19/2017,2:35 PM

## 2017-02-19 NOTE — Progress Notes (Signed)
Renal dose adjustment:  Dose of cefepime changed to 2 g IV q24h based on renal function and indication.  Lenis Noon, PharmD 02/19/17 8:34 AM

## 2017-02-20 ENCOUNTER — Telehealth: Payer: Self-pay | Admitting: *Deleted

## 2017-02-20 ENCOUNTER — Encounter: Payer: Self-pay | Admitting: *Deleted

## 2017-02-20 ENCOUNTER — Other Ambulatory Visit: Payer: Self-pay | Admitting: Physical Medicine and Rehabilitation

## 2017-02-20 DIAGNOSIS — M5416 Radiculopathy, lumbar region: Secondary | ICD-10-CM

## 2017-02-20 LAB — T3: T3, Total: 168 ng/dL (ref 71–180)

## 2017-02-20 LAB — T4: T4, Total: 8.2 ug/dL (ref 4.5–12.0)

## 2017-02-20 MED ORDER — LEVOFLOXACIN 750 MG PO TABS: 750.0000 mg | ORAL_TABLET | Freq: Every day | ORAL | 0 refills | Status: AC

## 2017-02-20 MED ORDER — BUDESONIDE-FORMOTEROL FUMARATE 80-4.5 MCG/ACT IN AERO: 2.0000 | INHALATION_SPRAY | Freq: Two times a day (BID) | RESPIRATORY_TRACT | 1 refills | Status: DC

## 2017-02-20 MED ORDER — ALBUTEROL SULFATE HFA 108 (90 BASE) MCG/ACT IN AERS: 2.0000 | INHALATION_SPRAY | Freq: Four times a day (QID) | RESPIRATORY_TRACT | 2 refills | Status: DC | PRN

## 2017-02-20 MED ORDER — RIVAROXABAN 15 MG PO TABS: 15.0000 mg | ORAL_TABLET | Freq: Two times a day (BID) | ORAL | 0 refills | Status: DC

## 2017-02-20 MED ORDER — LEVOTHYROXINE SODIUM 25 MCG PO TABS
25.0000 ug | ORAL_TABLET | Freq: Every day | ORAL | Status: DC
  Administered 2017-02-20: 25 ug via ORAL
  Filled 2017-02-20: qty 1

## 2017-02-20 MED ORDER — RIVAROXABAN 20 MG PO TABS: 20.0000 mg | ORAL_TABLET | Freq: Every day | ORAL | 2 refills | Status: DC

## 2017-02-20 NOTE — Care Management Note (Signed)
Case Management Note  Patient Details  Name: ALYCIA COOPERWOOD MRN: [id number] Date of Birth: 10/19/1933  Subjective/Objective:  No discharge needs identified.                   Action/Plan:   Expected Discharge Date:  02/20/17               Expected Discharge Plan:  Home/Self Care  In-House Referral:     Discharge planning Services  CM Consult  Post Acute Care Choice:    Choice offered to:     DME Arranged:    DME Agency:     HH Arranged:    HH Agency:     Status of Service:  Completed, signed off  If discussed at [employer] of Stay Meetings, dates discussed:    Additional Comments:  Jolly Mango, RN 02/20/2017, 9:57 AM

## 2017-02-20 NOTE — Progress Notes (Signed)
Pt alert and oriented. No complaints of pain this shift. Up to bathroom with assistance. Dry non productive cough. Able to sleep in between care.

## 2017-02-20 NOTE — Discharge Summary (Signed)
[facility] NAME: Elizabeth Fernandez    MR#:  [id number]  DATE OF BIRTH:  Feb 08, 1934  DATE OF ADMISSION:  02/19/2017 ADMITTING PHYSICIAN: Harrie Foreman, MD  DATE OF DISCHARGE: 02/20/2017 11:53 AM  PRIMARY CARE PHYSICIAN: Burnard Hawthorne, FNP    ADMISSION DIAGNOSIS:  Acute deep vein thrombosis (DVT) of other specified vein of left lower extremity ([place]) [I82.492] Community acquired pneumonia of left lung, unspecified part of lung [J18.9]  DISCHARGE DIAGNOSIS:  Active Problems:   HCAP (healthcare-associated pneumonia)   SECONDARY DIAGNOSIS:   Past Medical History:  Diagnosis Date  . Arthritis    both knees  . Cataract    Surgery scheduled for 03/2014    HOSPITAL COURSE:   83 year old female with past medical history of osteoarthritis, recent fall with a sacral fracture who presents to the hospital due to left lower extremity swelling and noted to have a DVT.   1. Left lower extremity DVT-provoked DVT given her recent sacral fracture and poor mobility. -Patient was started on Xarelto and is being discharged on that. Patient underwent a CT of the chest which showed no acute pulmonary embolism and patient was not hypoxic when ambulated upon discharge.  2. Pneumonia - incidentally noted to CT chest on admission.  -Patient was treated empirically with IV vancomycin, cefepime. Her MRSA PCR was negative and therefore vancomycin was discontinued. She is clinically improved now and is being discharged on oral Levaquin for additional 5 days. Patient CT chest also showed right apical cavitary healed area, unclear this was previous tuberculosis. Patient presently not having any hemoptysis. Her CT scan also showed emphysema and COPD. Patient was discharged on Symbicort and albuterol inhaler as needed. She was referred to pulmonary as an outpatient for pulmonary function tests and further evaluation of her cavitary lesion on her right lungs.     DISCHARGE CONDITIONS:   Stable.   CONSULTS OBTAINED:    DRUG ALLERGIES:   Allergies  Allergen Reactions  . Pollen Extract     DISCHARGE MEDICATIONS:   Allergies as of 02/20/2017      Reactions   Pollen Extract       Medication List    TAKE these medications   albuterol 108 (90 Base) MCG/ACT inhaler Commonly known as:  PROVENTIL HFA;VENTOLIN HFA Inhale 2 puffs into the lungs every 6 (six) hours as needed for wheezing or shortness of breath.   benzonatate 100 MG capsule Commonly known as:  TESSALON Take 1 capsule (100 mg total) by mouth 2 (two) times daily as needed for cough.   budesonide-formoterol 80-4.5 MCG/ACT inhaler Commonly known as:  SYMBICORT Inhale 2 puffs into the lungs 2 (two) times daily.   calcium-vitamin D 500-200 MG-UNIT tablet Commonly known as:  OSCAL WITH D Take 1 tablet by mouth 2 (two) times daily. Reported on 10/06/2015   levofloxacin 750 MG tablet Commonly known as:  LEVAQUIN Take 1 tablet (750 mg total) by mouth daily. What changed:  medication strength  how much to take   meloxicam 7.5 MG tablet Commonly known as:  MOBIC Take 1 tablet (7.5 mg total) by mouth daily. Take with food.   Rivaroxaban 15 MG Tabs tablet Commonly known as:  XARELTO Take 1 tablet (15 mg total) by mouth 2 (two) times daily.   rivaroxaban 20 MG Tabs tablet Commonly known as:  XARELTO Take 1 tablet (20 mg total) by mouth daily with supper. Start taking on:  03/12/2017  DISCHARGE INSTRUCTIONS:   DIET:  Regular diet  DISCHARGE CONDITION:  Stable  ACTIVITY:  Activity as tolerated  OXYGEN:  Home Oxygen: No.   Oxygen Delivery: room air  DISCHARGE LOCATION:  home   If you experience worsening of your admission symptoms, develop shortness of breath, life threatening emergency, suicidal or homicidal thoughts you must seek medical attention immediately by calling 911 or calling your MD immediately  if symptoms less severe.  You Must read  complete instructions/literature along with all the possible adverse reactions/side effects for all the Medicines you take and that have been prescribed to you. Take any new Medicines after you have completely understood and accpet all the possible adverse reactions/side effects.   Please note  You were cared for by a hospitalist during your hospital stay. If you have any questions about your discharge medications or the care you received while you were in the hospital after you are discharged, you can call the unit and asked to speak with the hospitalist on call if the hospitalist that took care of you is not available. Once you are discharged, your primary care physician will handle any further medical issues. Please note that NO REFILLS for any discharge medications will be authorized once you are discharged, as it is imperative that you return to your primary care physician (or establish a relationship with a primary care physician if you do not have one) for your aftercare needs so that they can reassess your need for medications and monitor your lab values.     Today   Feels better. Still has a cough.  No fever. BC are (-).  Left leg pain has improved.   VITAL SIGNS:  Blood pressure 123/60, pulse 80, temperature 98.3 F (36.8 C), temperature source Oral, resp. rate 18, height 5\' 4"  (1.626 m), weight 66.2 kg (146 lb), SpO2 95 %.  I/O:   Intake/Output Summary (Last 24 hours) at 02/20/17 1540 Last data filed at 02/20/17 0942  Gross per 24 hour  Intake              530 ml  Output                0 ml  Net              530 ml    PHYSICAL EXAMINATION:  GENERAL:  83 y.o.-year-old patient lying in the bed with no acute distress.  EYES: Pupils equal, round, reactive to light and accommodation. No scleral icterus. Extraocular muscles intact.  HEENT: Head atraumatic, normocephalic. Oropharynx and nasopharynx clear.  NECK:  Supple, no jugular venous distention. No thyroid enlargement, no  tenderness.  LUNGS: Prolonged Insp. & exp. Phase. no wheezing, rales,rhonchi. No use of accessory muscles of respiration.  CARDIOVASCULAR: S1, S2 normal. No murmurs, rubs, or gallops.  ABDOMEN: Soft, non-tender, non-distended. Bowel sounds present. No organomegaly or mass.  EXTREMITIES: No pedal edema, cyanosis, or clubbing.  NEUROLOGIC: Cranial nerves II through XII are intact. No focal motor or sensory defecits b/l.  PSYCHIATRIC: The patient is alert and oriented x 3.   SKIN: No obvious rash, lesion, or ulcer.   DATA REVIEW:   CBC  Recent Labs Lab 02/19/17 0134  WBC 5.4  HGB 13.0  HCT 38.2  PLT 172    Chemistries   Recent Labs Lab 02/19/17 0134  NA 139  K 4.6  CL 104  CO2 27  GLUCOSE 96  BUN 20  CREATININE 0.76  CALCIUM 8.6*    Cardiac  Enzymes  Recent Labs Lab 02/19/17 0134  TROPONINI <0.03    Microbiology Results  Results for orders placed or performed during the hospital encounter of 02/19/17  MRSA PCR Screening     Status: None   Collection Time: 02/18/17  8:57 PM  Result Value Ref Range Status   MRSA by PCR NEGATIVE NEGATIVE Final    Comment:        The GeneXpert MRSA Assay (FDA approved for NASAL specimens only), is one component of a comprehensive MRSA colonization surveillance program. It is not intended to diagnose MRSA infection nor to guide or monitor treatment for MRSA infections.   Culture, blood (routine x 2)     Status: None (Preliminary result)   Collection Time: 02/19/17  1:34 AM  Result Value Ref Range Status   Specimen Description BLOOD LT [facility]  Final   Special Requests   Final    BOTTLES DRAWN AEROBIC AND ANAEROBIC Blood Culture adequate volume   Culture NO GROWTH 1 DAY  Final   Report Status PENDING  Incomplete  Culture, blood (routine x 2)     Status: None (Preliminary result)   Collection Time: 02/19/17  2:40 AM  Result Value Ref Range Status   Specimen Description BLOOD LT [facility]  Final   Special Requests   Final     BOTTLES DRAWN AEROBIC AND ANAEROBIC Blood Culture results may not be optimal due to an excessive volume of blood received in culture bottles   Culture NO GROWTH 1 DAY  Final   Report Status PENDING  Incomplete    RADIOLOGY:  Ct Angio Chest Pe W Or Wo Contrast  Result Date: 02/18/2017 CLINICAL DATA:  Shortness of breath. Cough for the past 4 days. Left leg swelling. Previous cholecystectomy. EXAM: CT ANGIOGRAPHY CHEST WITH CONTRAST TECHNIQUE: Multidetector CT imaging of the chest was performed using the standard protocol during bolus administration of intravenous contrast. Multiplanar CT image reconstructions and MIPs were obtained to evaluate the vascular anatomy. CONTRAST:  75 cc Isovue 370 COMPARISON:  Chest radiograph dated 11/10/2014. FINDINGS: Cardiovascular: Satisfactory opacification of the pulmonary arteries to the segmental level. No evidence of pulmonary embolism. Normal heart size. No pericardial effusion. Small amount of aortic calcification. Normal sized heart. Mediastinum/Nodes: No enlarged mediastinal, hilar, or axillary lymph nodes. Thyroid gland, trachea, and esophagus demonstrate no significant findings. Lungs/Pleura: The lungs are mildly hyperexpanded with mild diffuse peribronchial thickening. There is a small amount of focal patchy opacity in the left lower lobe on image number 60 of series 6, measuring 2.8 x 1.8 cm. There is also an area of focal bullous changes or cavitation in the apical portion of the right upper lobe with some associated soft tissue density measuring 2.8 x 1.9 cm on image number 10 of series 6. Mild left apical pleural and parenchymal scarring is also. Upper Abdomen: Intrahepatic biliary ductal dilatation with a left hepatic duct diameter of 10 mm. Musculoskeletal: Thoracic spine degenerative changes and mild to moderate dextroconvex scoliosis. Review of the MIP images confirms the above findings. IMPRESSION: 1. Left lower lobe airspace opacity, suspicious for  pneumonia. 2. Right apical focal bullous changes or multiloculated cavitation with a small amount of associated density, most likely representing post infectious changes. A neoplastic process is much less likely. This could be followed with a repeat chest CT without contrast in 6 months. 3. No pulmonary emboli seen. 4. Intrahepatic biliary ductal dilatation. This is most likely normal for a patient of this age who has had a previous  cholecystectomy. Biliary obstruction is less likely. 5. Mild changes of COPD and chronic bronchitis. 6. Mild calcified aortic atherosclerosis. Aortic Atherosclerosis (ICD10-I70.0) and Emphysema (ICD10-J43.9). Electronically Signed   By: Claudie Revering M.D.   On: 02/18/2017 17:03   US Venous Img Lower Unilateral Left  Addendum Date: 02/18/2017   ADDENDUM REPORT: 02/18/2017 18:22 ADDENDUM: Critical Value/emergent results were called by telephone at the time of interpretation on 02/18/2017 at 6:15 pm to Dr. Deborra Medina , who verbally acknowledged these results. Patient will be escorted to the emergency room as requested by Dr. Derrel Nip. Electronically Signed   By: Jeannine Boga M.D.   On: 02/18/2017 18:22   Result Date: 02/18/2017 CLINICAL DATA:  Initial evaluation for acute left leg swelling. EXAM: Left LOWER EXTREMITY VENOUS DOPPLER ULTRASOUND TECHNIQUE: Gray-scale sonography with graded compression, as well as color Doppler and duplex ultrasound were performed to evaluate the lower extremity deep venous systems from the level of the common femoral vein and including the common femoral, femoral, profunda femoral, popliteal and calf veins including the posterior tibial, peroneal and gastrocnemius veins when visible. The superficial great saphenous vein was also interrogated. Spectral Doppler was utilized to evaluate flow at rest and with distal augmentation maneuvers in the common femoral, femoral and popliteal veins. COMPARISON:  Prior ultrasound from 11/19/2016. FINDINGS:  Contralateral Common Femoral Vein: Respiratory phasicity is normal and symmetric with the symptomatic side. No evidence of thrombus. Normal compressibility. Common Femoral Vein: No evidence of thrombus. Normal compressibility, respiratory phasicity and response to augmentation. Saphenofemoral Junction: No evidence of thrombus. Normal compressibility and flow on color Doppler imaging. Profunda Femoral Vein: No evidence of thrombus. Normal compressibility and flow on color Doppler imaging. Femoral Vein: No evidence of thrombus. Normal compressibility, respiratory phasicity and response to augmentation. Popliteal Vein: Echogenic occlusive thrombus within the distal left popliteal vein, extending into the posterior tibial vein. Loss of normal compressibility. Calf Veins: Echogenic thrombus within knee left posterior tibial vein, extending from the left popliteal vein. Left peroneal vein not definitely visualized. Superficial Great Saphenous Vein: No evidence of thrombus. Normal compressibility and flow on color Doppler imaging. Other Findings:  None. IMPRESSION: Positive study with occlusive DVT in the left popliteal vein, extending into the proximal left posterior tibial vein. Electronically Signed: By: Jeannine Boga M.D. On: 02/18/2017 18:07     Management plans discussed with the patient, family and they are in agreement.    TOTAL TIME TAKING CARE OF THIS PATIENT: 40 minutes.    Henreitta Leber M.D on 02/20/2017 at 3:40 PM  Between 7am to 6pm - Pager - [phone]  After 6pm go to [email or web address] - [employer] [facility]  Office  [phone]  CC: Primary care physician; Burnard Hawthorne, FNP

## 2017-02-20 NOTE — Telephone Encounter (Signed)
Pt will D/C from [facility] on 02/20/17. Pt has been scheduled for 02/25/17

## 2017-02-21 NOTE — Telephone Encounter (Signed)
Transition Care Management Follow-up Telephone Call  How have you been since you were released from the hospital? Patient stated that she felt much better since discharge denies SOB, no pain in he leg, denies swelling to left leg.    Do you understand why you were in the hospital? Yes   Do you understand the discharge instrcutions?Yes  Items Reviewed:  Medications reviewed: Yes  Allergies reviewed: yes  Dietary changes reviewed:yes  Referrals reviewed: yes   Functional Questionnaire:   Activities of Daily Living (ADLs):   She states they are independent in the following: All ADLS States they require assistance with the following: No assist needed at this time.   Any transportation issues/concerns?: No   Any patient concerns? no   Confirmed importance and date/time of follow-up visits scheduled:Yes  Confirmed with patient if condition begins to worsen call PCP or go to the ER.  Patient was given the Call-a-Nurse line [phone]

## 2017-02-24 LAB — CULTURE, BLOOD (ROUTINE X 2)
CULTURE: NO GROWTH
Culture: NO GROWTH
Special Requests: ADEQUATE

## 2017-02-25 ENCOUNTER — Ambulatory Visit (INDEPENDENT_AMBULATORY_CARE_PROVIDER_SITE_OTHER): Payer: Medicare Other | Admitting: Family

## 2017-02-25 ENCOUNTER — Telehealth: Payer: Self-pay | Admitting: *Deleted

## 2017-02-25 ENCOUNTER — Encounter: Payer: Self-pay | Admitting: Family

## 2017-02-25 VITALS — BP 116/60 | HR 94 | Temp 98.0°F | Ht 64.0 in | Wt 145.2 lb

## 2017-02-25 DIAGNOSIS — R5383 Other fatigue: Secondary | ICD-10-CM | POA: Diagnosis not present

## 2017-02-25 DIAGNOSIS — E039 Hypothyroidism, unspecified: Secondary | ICD-10-CM | POA: Diagnosis not present

## 2017-02-25 DIAGNOSIS — J40 Bronchitis, not specified as acute or chronic: Secondary | ICD-10-CM

## 2017-02-25 DIAGNOSIS — I82A12 Acute embolism and thrombosis of left axillary vein: Secondary | ICD-10-CM | POA: Diagnosis not present

## 2017-02-25 MED ORDER — RIVAROXABAN 20 MG PO TABS: 20.0000 mg | ORAL_TABLET | Freq: Every day | ORAL | 2 refills | Status: DC

## 2017-02-25 MED ORDER — RIVAROXABAN 15 MG PO TABS: 15.0000 mg | ORAL_TABLET | Freq: Two times a day (BID) | ORAL | 0 refills | Status: DC

## 2017-02-25 MED ORDER — BENZONATATE 100 MG PO CAPS: 100.0000 mg | ORAL_CAPSULE | Freq: Two times a day (BID) | ORAL | 0 refills | Status: DC | PRN

## 2017-02-25 NOTE — Progress Notes (Signed)
Pre visit review using our clinic review tool, if applicable. No additional management support is needed unless otherwise documented below in the visit note. 

## 2017-02-25 NOTE — Telephone Encounter (Signed)
Pt has requested to have rivaroxaban sent over to [facility]

## 2017-02-25 NOTE — Progress Notes (Signed)
Subjective:    Patient ID: Elizabeth Fernandez, female    DOB: 1934-05-01, 83 y.o.   MRN: [id number]  CC: Elizabeth Fernandez is a 83 y.o. female who presents today for follow up.   HPI: Patient here for transitional care appointment after diagnosed with pneumonia, left lower extremity DVT. She was discharged 7/18. Admitted 7/16. She was called 7/19.  Provoked DVT in the context of her recent sacral fracture and poor mobility. Patient was started on Xarelto. CT chest showed no acute pulmonary embolism.   Pneumonia-patient was apparently treated with IV vancomycin, cefepime. A CT chest showed emphysema and COPD, patient was discharged on Symbicort and albuterol inhaler as needed. She is also referred to pulmonary as outpatient for pulmonary function test further evaluation of lesion and right lungs, cavitation. ( need repeat CT chest in 46months) Normal  electrolytes. Negative MRSA by PCR. Troponin negative.   Continues to cough, unchanged. Endorses shortness of breath with exertion .Dry cough. Taking tessalon with some relief. Sleeping in chair. No fever. Using symbicort and albuterol. No wheezing. No sinus pressure.   Does complains of fatigue with ordinary household activities. No syncope, wheezing,  exertional chest pain or pressure, numbness or tingling radiating to left arm or jaw, palpitations, dizziness, frequent headaches, changes in vision. Family h/o MI ( father).        rev HISTORY:  Past Medical History:  Diagnosis Date  . Arthritis    both knees  . Cataract    Surgery scheduled for 03/2014   Past Surgical History:  Procedure Laterality Date  . CHOLECYSTECTOMY    . FEMUR SURGERY  11/10/2014  . IR FLUORO GUIDED NEEDLE PLC ASPIRATION/INJECTION LOC  01/25/2017   Family History  Problem Relation Age of Onset  . Cancer Mother   . Heart disease Father 21       MI    Allergies: Pollen extract Current Outpatient Prescriptions on File Prior to Visit  Medication Sig Dispense Refill   . albuterol (PROVENTIL HFA;VENTOLIN HFA) 108 (90 Base) MCG/ACT inhaler Inhale 2 puffs into the lungs every 6 (six) hours as needed for wheezing or shortness of breath. 1 Inhaler 2  . budesonide-formoterol (SYMBICORT) 80-4.5 MCG/ACT inhaler Inhale 2 puffs into the lungs 2 (two) times daily. 1 Inhaler 1  . calcium-vitamin D (OSCAL WITH D) 500-200 MG-UNIT per tablet Take 1 tablet by mouth 2 (two) times daily. Reported on 10/06/2015    . levofloxacin (LEVAQUIN) 750 MG tablet Take 1 tablet (750 mg total) by mouth daily. 5 tablet 0  . meloxicam (MOBIC) 7.5 MG tablet Take 1 tablet (7.5 mg total) by mouth daily. Take with food. 90 tablet 1  . Rivaroxaban (XARELTO) 15 MG TABS tablet Take 1 tablet (15 mg total) by mouth 2 (two) times daily. 40 tablet 0  . [START ON 03/12/2017] rivaroxaban (XARELTO) 20 MG TABS tablet Take 1 tablet (20 mg total) by mouth daily with supper. 30 tablet 2   No current facility-administered medications on file prior to visit.     Social History  Substance Use Topics  . Smoking status: Never Smoker  . Smokeless tobacco: Never Used  . Alcohol use No    Review of Systems  Constitutional: Positive for fatigue. Negative for chills and fever.  Eyes: Negative for visual disturbance.  Respiratory: Positive for cough and shortness of breath. Negative for wheezing.   Cardiovascular: Negative for chest pain, palpitations and leg swelling.  Gastrointestinal: Negative for nausea and vomiting.  Neurological: Negative for  dizziness and syncope.      Objective:    BP 116/60   Pulse 94   Temp 98 F (36.7 C) (Oral)   Ht 5\' 4"  (1.626 m)   Wt 145 lb 3.2 oz (65.9 kg)   SpO2 98%   BMI 24.92 kg/m  BP Readings from Last 3 Encounters:  02/25/17 116/60  02/20/17 123/60  02/18/17 110/64   Wt Readings from Last 3 Encounters:  02/25/17 145 lb 3.2 oz (65.9 kg)  02/18/17 146 lb (66.2 kg)  02/18/17 146 lb 3.2 oz (66.3 kg)    Physical Exam  Constitutional: She appears well-developed  and well-nourished.  Eyes: Conjunctivae are normal.  Cardiovascular: Normal rate, regular rhythm, normal heart sounds and normal pulses.   Pulmonary/Chest: Effort normal and breath sounds normal. She has no wheezes. She has no rhonchi. She has no rales.  Neurological: She is alert.  Skin: Skin is warm and dry.  Psychiatric: She has a normal mood and affect. Her speech is normal and behavior is normal. Thought content normal.  Vitals reviewed.      Assessment & Plan:   Problem List Items Addressed This Visit      Cardiovascular and Mediastinum   Acute deep vein thrombosis (DVT) of axillary vein of left upper extremity (HCC)    Provoked DVT. Currently on Xarelto. Reviewed hospitalization notes. CT angio negative for PE. Discussed with patient that we would discuss long-term staying on Xarelto at a later date.        Respiratory   Bronchitis    Patient recently treated for pneumonia. No fever. Continues to have persistent cough. We jointly agreed likely post pneumonia cough. Continue Tessalon Perles and advised her to give inhalers more time. Also concerned etiology may be chronic in nature with CT chest changes seen regarding COPD. Patient pending pulmonology appointment.      Relevant Medications   benzonatate (TESSALON) 100 MG capsule     Other   Fatigue - Primary    Fatigue is nonspecific at this time. Discussed with patient my suspicion for multiple etiologies for this including recent immobility with sacral fracture, possible COPD. We also discussed that patient has a strong family history for cardiac, and has not had any evaluation for this. I do not see any overt cardiac symptoms at this time however a nonspecific finding of fatigue in a patient of her age, I would like her to have cardiology evaluation.pending thyroid study.  Patient agreed with this plan. Will follow.       Relevant Orders   Ambulatory referral to Cardiology    Other Visit Diagnoses    Hypothyroidism,  unspecified type       Relevant Orders   TSH   T4, free   T3, free   Comprehensive metabolic panel       I am having [name] maintain her calcium-vitamin D, meloxicam, Rivaroxaban, rivaroxaban, levofloxacin, budesonide-formoterol, albuterol, and benzonatate.   Meds ordered this encounter  Medications  . benzonatate (TESSALON) 100 MG capsule    Sig: Take 1 capsule (100 mg total) by mouth 2 (two) times daily as needed for cough.    Dispense:  20 capsule    Refill:  0    Order Specific Question:   Supervising Provider    Answer:   Crecencio Mc [[id number]]    Return precautions given.   Risks, benefits, and alternatives of the medications and treatment plan prescribed today were discussed, and patient expressed understanding.   Education regarding  symptom management and diagnosis given to patient on AVS.  Continue to follow with Burnard Hawthorne, FNP for routine health maintenance.   Cassandria Santee and I agreed with plan.   Mable Paris, FNP

## 2017-02-25 NOTE — Telephone Encounter (Signed)
Medication has been sent.  

## 2017-02-25 NOTE — Assessment & Plan Note (Signed)
Provoked DVT. Currently on Xarelto. Reviewed hospitalization notes. CT angio negative for PE. Discussed with patient that we would discuss long-term staying on Xarelto at a later date.

## 2017-02-25 NOTE — Patient Instructions (Addendum)
Mucinex  Continue to use inhalers.   Let me know if cough doesn't improve  Let's await on consult with pulmonology and cardiology and continue to monitor fatigue.    Follow up 2 months, sooner if needed.

## 2017-02-25 NOTE — Assessment & Plan Note (Addendum)
Fatigue is nonspecific at this time. Discussed with patient my suspicion for multiple etiologies for this including recent immobility with sacral fracture, possible COPD. We also discussed that patient has a strong family history for cardiac, and has not had any evaluation for this. I do not see any overt cardiac symptoms at this time however a nonspecific finding of fatigue in a patient of her age, I would like her to have cardiology evaluation.pending thyroid study.  Patient agreed with this plan. Will follow.

## 2017-02-25 NOTE — Assessment & Plan Note (Signed)
Patient recently treated for pneumonia. No fever. Continues to have persistent cough. We jointly agreed likely post pneumonia cough. Continue Tessalon Perles and advised her to give inhalers more time. Also concerned etiology may be chronic in nature with CT chest changes seen regarding COPD. Patient pending pulmonology appointment.

## 2017-02-27 ENCOUNTER — Telehealth: Payer: Self-pay | Admitting: *Deleted

## 2017-02-27 NOTE — Telephone Encounter (Signed)
Spoke to patients son and informed him of past appointment.  Elizabeth Fernandez would like to know when his mother can start back physical therapy.

## 2017-02-27 NOTE — Telephone Encounter (Signed)
Unable to leave VM on sons phone.

## 2017-02-27 NOTE — Telephone Encounter (Signed)
Pt's son has requested a call, he would like to discuss pt's previous appt. He has some questions that pt could not answer.  Please contact Lennette Bihari [phone]

## 2017-02-28 NOTE — Telephone Encounter (Signed)
Call son-  I would base that decision on how his mother his feeling. My advise is to start light activity with PT and gradually work her way up since she is quite fatigued.   She may want to give another week or so however there are no contraindications to starting PT now that she is an anticoagulant for DVT.

## 2017-03-01 NOTE — Progress Notes (Signed)
Was informed from son that patient is feeling alittle better.

## 2017-03-01 NOTE — Telephone Encounter (Signed)
Elizabeth Fernandez has been informed.

## 2017-03-01 NOTE — Progress Notes (Signed)
Spoken to daughter inlaw and patient at appointment.

## 2017-03-22 NOTE — Progress Notes (Addendum)
[facility] Consultation      Assessment and Plan:  The patient is an 83 year old female recently admitted to the hospital with pneumonia, course was complicated by development of left lower extremity DVT. CT of the chest showed a lung nodule.   Lung nodule/scar. -Changes in the right upper lobe and left lower lobe could be residual from previous pneumonia versus old infection versus cancer. -Patient has significant secondhand smoke exposure, and recent thromboembolic phenomena, this raises the index of suspicion for cancer. Discussed with patient, will order a PET scan.  Addendum:  PET scan results reviewed, no evidence of cancer, results relayed to pt via telephone. No further scanning needed.   Left lower extremity DVT, now possibly, complicated by post phlebitic syndrome. -Significant left lower extremity edema, possible persistent thromboembolic disease versus post phlebitic syndrome. We'll check lower extremity Doppler to rule out acute thrombosis.  Emphysema. -Emphysematous changes seen on CT chest. -Minimally symptomatic, no need for long-acting inhalers at this time.     Date: 03/22/2017  MRN# [id number] Elizabeth Fernandez [birth year]  Referring Physician:   [name] is a 83 y.o. old female seen in consultation for chief complaint of:    Chief Complaint  Patient presents with  . Advice Only    Referred by Elizabeth Fernandez  . COPD    pt had pneumonia and was hospitalized 7/16 had CT chest. Pt reports all pulmonary sx have resolved.    HPI:   She had an episode of pneumonia about a month ago, she feels that she has recovered from the pneumonia. She notes that that she is not limited by breathing. She is frequently working outside in her yard and has no difficulty with this, she also does housework without difficulty. She was prescribed an inhaler with the inhaler after her recent hospitalization, but she has not needed them. She was diagnosed with a  RLE DVT, she was discharged with xarelto 15 mg bid, she is not on 20 mg once daily. She had compressions stockings, which she stopped using. She denies dyspnea or chest pain.  She has never been a smoker, her husband smoked and parents smoked. Her mother died of cancer.    Images  personally reviewed, CT chest, 02/18/2017; there is a area of conglomerated scar in the right upper lobe, about 2 cm in diameter. there are changes of emphysema, a vague left lower lobe strandy opacity.   PMHX:   Past Medical History:  Diagnosis Date  . Arthritis    both knees  . Cataract    Surgery scheduled for 03/2014   Surgical Hx:  Past Surgical History:  Procedure Laterality Date  . CHOLECYSTECTOMY    . FEMUR SURGERY  11/10/2014  . IR FLUORO GUIDED NEEDLE PLC ASPIRATION/INJECTION LOC  01/25/2017   Family Hx:  Family History  Problem Relation Age of Onset  . Cancer Mother   . Heart disease Father 79       MI   Social Hx:   Social History  Substance Use Topics  . Smoking status: Never Smoker  . Smokeless tobacco: Never Used  . Alcohol use No   Medication:    Current Outpatient Prescriptions:  .  albuterol (PROVENTIL HFA;VENTOLIN HFA) 108 (90 Base) MCG/ACT inhaler, Inhale 2 puffs into the lungs every 6 (six) hours as needed for wheezing or shortness of breath., Disp: 1 Inhaler, Rfl: 2 .  benzonatate (TESSALON) 100 MG capsule, Take 1 capsule (100 mg total) by mouth 2 (  two) times daily as needed for cough., Disp: 20 capsule, Rfl: 0 .  budesonide-formoterol (SYMBICORT) 80-4.5 MCG/ACT inhaler, Inhale 2 puffs into the lungs 2 (two) times daily., Disp: 1 Inhaler, Rfl: 1 .  calcium-vitamin D (OSCAL WITH D) 500-200 MG-UNIT per tablet, Take 1 tablet by mouth 2 (two) times daily. Reported on 10/06/2015, Disp: , Rfl:  .  meloxicam (MOBIC) 7.5 MG tablet, Take 1 tablet (7.5 mg total) by mouth daily. Take with food., Disp: 90 tablet, Rfl: 1 .  Rivaroxaban (XARELTO) 15 MG TABS tablet, Take 1 tablet (15 mg total)  by mouth 2 (two) times daily., Disp: 40 tablet, Rfl: 0 .  rivaroxaban (XARELTO) 20 MG TABS tablet, Take 1 tablet (20 mg total) by mouth daily with supper., Disp: 30 tablet, Rfl: 2   Allergies:  Pollen extract  Review of Systems: Gen:  Denies  fever, sweats, chills HEENT: Denies blurred vision, double vision. bleeds, sore throat Cvc:  No dizziness, chest pain. Resp:   Denies cough or sputum production, shortness of breath Gi: Denies swallowing difficulty, stomach pain. Gu:  Denies bladder incontinence, burning urine Ext:   No Joint pain, stiffness. Skin: No skin rash,  hives  Endoc:  No polyuria, polydipsia. Psych: No depression, insomnia. Other:  All other systems were reviewed with the patient and were negative other that what is mentioned in the HPI.   Physical Examination:   VS: BP (!) 142/78 (BP Location: Left Arm, Cuff Size: Normal)   Pulse 90   Resp 16   Ht 5\' 4"  (1.626 m)   Wt 149 lb (67.6 kg)   SpO2 96%   BMI 25.58 kg/m   General Appearance: No distress  Neuro:without focal findings,  speech normal,  HEENT: PERRLA, EOM intact.   Pulmonary: normal breath sounds, No wheezing.  CardiovascularNormal S1,S2.  No m/r/g.   Abdomen: Benign, Soft, non-tender. Renal:  No costovertebral tenderness  GU:  No performed at this time. Endoc: No evident thyromegaly, no signs of acromegaly. Skin:   warm, no rashes, no ecchymosis  Extremities: normal, no cyanosis, clubbing. 3+ left lower extremity edema.  Other findings:    LABORATORY PANEL:   CBC No results for input(s): WBC, HGB, HCT, PLT in the last 168 hours. ------------------------------------------------------------------------------------------------------------------  Chemistries  No results for input(s): NA, K, CL, CO2, GLUCOSE, BUN, CREATININE, CALCIUM, MG, AST, ALT, ALKPHOS, BILITOT in the last 168 hours.  Invalid input(s):  GFRCGP ------------------------------------------------------------------------------------------------------------------  Cardiac Enzymes No results for input(s): TROPONINI in the last 168 hours. ------------------------------------------------------------  RADIOLOGY:  No results found.     Thank  you for the consultation and for allowing [facility] to assist in the care of your patient. Our recommendations are noted above.  Please contact us if we can be of further service.   Marda Stalker, MD.  Board Certified in Internal Medicine, Pulmonary Medicine, [place], and Sleep Medicine.  [facility] Office Number: [phone]  Patricia Pesa, M.D.  Merton Border, M.D  03/22/2017

## 2017-03-26 ENCOUNTER — Encounter: Payer: Self-pay | Admitting: Internal Medicine

## 2017-03-26 ENCOUNTER — Ambulatory Visit (INDEPENDENT_AMBULATORY_CARE_PROVIDER_SITE_OTHER): Payer: Medicare Other | Admitting: Internal Medicine

## 2017-03-26 ENCOUNTER — Ambulatory Visit
Admission: RE | Admit: 2017-03-26 | Discharge: 2017-03-26 | Disposition: A | Discharge status: Home / Self Care | Payer: Medicare Other | Source: Ambulatory Visit | Attending: Internal Medicine | Admitting: Internal Medicine

## 2017-03-26 VITALS — BP 142/78 | HR 90 | Resp 16 | Ht 64.0 in | Wt 149.0 lb

## 2017-03-26 DIAGNOSIS — I824Y2 Acute embolism and thrombosis of unspecified deep veins of left proximal lower extremity: Secondary | ICD-10-CM | POA: Diagnosis not present

## 2017-03-26 DIAGNOSIS — I82432 Acute embolism and thrombosis of left popliteal vein: Secondary | ICD-10-CM | POA: Insufficient documentation

## 2017-03-26 DIAGNOSIS — Z7901 Long term (current) use of anticoagulants: Secondary | ICD-10-CM | POA: Insufficient documentation

## 2017-03-26 DIAGNOSIS — R911 Solitary pulmonary nodule: Secondary | ICD-10-CM | POA: Diagnosis not present

## 2017-03-26 DIAGNOSIS — I82402 Acute embolism and thrombosis of unspecified deep veins of left lower extremity: Secondary | ICD-10-CM | POA: Diagnosis not present

## 2017-03-26 NOTE — Patient Instructions (Signed)
--  Will send for PET scan and ultrasound of your leg.   --You should be sure to wear compression stockings on your left leg.

## 2017-03-27 ENCOUNTER — Telehealth: Payer: Self-pay | Admitting: Internal Medicine

## 2017-03-27 ENCOUNTER — Telehealth: Payer: Self-pay | Admitting: Family

## 2017-03-27 ENCOUNTER — Ambulatory Visit: Payer: Medicare Other

## 2017-03-27 DIAGNOSIS — M48062 Spinal stenosis, lumbar region with neurogenic claudication: Secondary | ICD-10-CM | POA: Diagnosis not present

## 2017-03-27 DIAGNOSIS — M5136 Other intervertebral disc degeneration, lumbar region: Secondary | ICD-10-CM | POA: Diagnosis not present

## 2017-03-27 DIAGNOSIS — M5416 Radiculopathy, lumbar region: Secondary | ICD-10-CM | POA: Diagnosis not present

## 2017-03-27 NOTE — Telephone Encounter (Signed)
Pt called wanting to know if she should go forth with getting the PET scan done tomorrow? Please advise? Pt just wants to make sure if she should do it?  Call pt @ [phone]. Thank you!

## 2017-03-27 NOTE — Telephone Encounter (Signed)
Confirmed that patient needed to go have PET scan tomorrow.

## 2017-03-27 NOTE — Telephone Encounter (Signed)
Are you wanting a full body PET scan or just a skull to thigh. Please advise.

## 2017-03-27 NOTE — Telephone Encounter (Signed)
[place] pet scan department [place] calling needing to verfify type of scan that patient is scheduled for tomorrow. The order stated whole body, she wants to make sure it is to be that or is it Skull to side scan  Please call back

## 2017-03-27 NOTE — Telephone Encounter (Signed)
Per DR, he wants the whole body PET scan done. [facility] informed. Nothing further needed.

## 2017-03-27 NOTE — Telephone Encounter (Signed)
Please call PET .  They are checking on status of clarification on order.

## 2017-03-28 ENCOUNTER — Encounter
Admission: RE | Admit: 2017-03-28 | Discharge: 2017-03-28 | Disposition: A | Discharge status: Home / Self Care | Payer: Medicare Other | Source: Ambulatory Visit | Attending: Internal Medicine | Admitting: Internal Medicine

## 2017-03-28 DIAGNOSIS — R911 Solitary pulmonary nodule: Secondary | ICD-10-CM | POA: Insufficient documentation

## 2017-03-28 DIAGNOSIS — R918 Other nonspecific abnormal finding of lung field: Secondary | ICD-10-CM | POA: Diagnosis not present

## 2017-03-28 LAB — GLUCOSE, CAPILLARY: GLUCOSE-CAPILLARY: 85 mg/dL (ref 65–99)

## 2017-03-28 MED ORDER — FLUDEOXYGLUCOSE F - 18 (FDG) INJECTION
12.0000 | Freq: Once | INTRAVENOUS | Status: AC | PRN
  Administered 2017-03-28: 13.09 via INTRAVENOUS

## 2017-04-03 ENCOUNTER — Encounter (INDEPENDENT_AMBULATORY_CARE_PROVIDER_SITE_OTHER): Payer: Medicare Other | Admitting: Ophthalmology

## 2017-04-04 ENCOUNTER — Encounter (INDEPENDENT_AMBULATORY_CARE_PROVIDER_SITE_OTHER): Payer: Medicare Other | Admitting: Ophthalmology

## 2017-04-04 DIAGNOSIS — H43813 Vitreous degeneration, bilateral: Secondary | ICD-10-CM

## 2017-04-04 DIAGNOSIS — H35033 Hypertensive retinopathy, bilateral: Secondary | ICD-10-CM

## 2017-04-04 DIAGNOSIS — H34811 Central retinal vein occlusion, right eye, with macular edema: Secondary | ICD-10-CM

## 2017-04-04 DIAGNOSIS — I10 Essential (primary) hypertension: Secondary | ICD-10-CM | POA: Diagnosis not present

## 2017-04-21 NOTE — Progress Notes (Deleted)
Cardiology Office Note  Date:  04/21/2017   ID:  Elizabeth Fernandez, DOB [birth year], MRN [id number]  PCP:  Burnard Hawthorne, FNP   No chief complaint on file.   HPI:   sacral fracture DVT in her left leg CT scan: pulmonary embolism but did show a left lower lobe pneumonia Lung nodule Pet scan negative  Fatigue   PMH:   has a past medical history of Arthritis and Cataract.  PSH:    Past Surgical History:  Procedure Laterality Date  . CHOLECYSTECTOMY    . FEMUR SURGERY  11/10/2014  . IR FLUORO GUIDED NEEDLE PLC ASPIRATION/INJECTION LOC  01/25/2017    Current Outpatient Prescriptions  Medication Sig Dispense Refill  . albuterol (PROVENTIL HFA;VENTOLIN HFA) 108 (90 Base) MCG/ACT inhaler Inhale 2 puffs into the lungs every 6 (six) hours as needed for wheezing or shortness of breath. (Patient not taking: Reported on 03/26/2017) 1 Inhaler 2  . budesonide-formoterol (SYMBICORT) 80-4.5 MCG/ACT inhaler Inhale 2 puffs into the lungs 2 (two) times daily. (Patient not taking: Reported on 03/26/2017) 1 Inhaler 1  . calcium-vitamin D (OSCAL WITH D) 500-200 MG-UNIT per tablet Take 1 tablet by mouth 2 (two) times daily. Reported on 10/06/2015    . meloxicam (MOBIC) 7.5 MG tablet Take 1 tablet (7.5 mg total) by mouth daily. Take with food. 90 tablet 1  . rivaroxaban (XARELTO) 20 MG TABS tablet Take 1 tablet (20 mg total) by mouth daily with supper. 30 tablet 2   No current facility-administered medications for this visit.      Allergies:   Pollen extract   Social History:  The patient  reports that she has never smoked. She has never used smokeless tobacco. She reports that she does not drink alcohol or use drugs.   Family History:   family history includes Cancer in her mother; Heart disease (age of onset: 44) in her father.    Review of Systems: ROS   PHYSICAL EXAM: VS:  There were no vitals taken for this visit. , BMI There is no height or weight on file to calculate BMI. GEN: Well  nourished, well developed, in no acute distress HEENT: normal Neck: no JVD, carotid bruits, or masses Cardiac: RRR; no murmurs, rubs, or gallops,no edema  Respiratory:  clear to auscultation bilaterally, normal work of breathing GI: soft, nontender, nondistended, + BS MS: no deformity or atrophy Skin: warm and dry, no rash Neuro:  Strength and sensation are intact Psych: euthymic mood, full affect    Recent Labs: 12/25/2016: ALT 11 02/19/2017: BUN 20; Creatinine, Ser 0.76; Hemoglobin 13.0; Platelets 172; Potassium 4.6; Sodium 139; TSH 7.857    Lipid Panel Lab Results  Component Value Date   CHOL 199 12/25/2016   HDL 71.40 12/25/2016   LDLCALC 117 (H) 12/25/2016   TRIG 54.0 12/25/2016      Wt Readings from Last 3 Encounters:  03/26/17 149 lb (67.6 kg)  02/25/17 145 lb 3.2 oz (65.9 kg)  02/18/17 146 lb (66.2 kg)       ASSESSMENT AND PLAN:  No diagnosis found.   Disposition:   F/U  6 months  No orders of the defined types were placed in this encounter.    Signed, Esmond Plants, M.D., Ph.D. 04/21/2017  [place], [place]

## 2017-04-22 ENCOUNTER — Ambulatory Visit: Payer: Medicare Other | Admitting: Cardiovascular Disease

## 2017-05-16 ENCOUNTER — Encounter (INDEPENDENT_AMBULATORY_CARE_PROVIDER_SITE_OTHER): Payer: Medicare Other | Admitting: Ophthalmology

## 2017-05-16 DIAGNOSIS — H348112 Central retinal vein occlusion, right eye, stable: Secondary | ICD-10-CM | POA: Diagnosis not present

## 2017-05-16 DIAGNOSIS — H35033 Hypertensive retinopathy, bilateral: Secondary | ICD-10-CM | POA: Diagnosis not present

## 2017-05-16 DIAGNOSIS — I10 Essential (primary) hypertension: Secondary | ICD-10-CM

## 2017-05-16 DIAGNOSIS — H43813 Vitreous degeneration, bilateral: Secondary | ICD-10-CM | POA: Diagnosis not present

## 2017-06-14 NOTE — Progress Notes (Deleted)
[facility] Consultation      Assessment and Plan:  The patient is an 83 year old female recently admitted to the hospital with pneumonia, course was complicated by development of left lower extremity DVT. CT of the chest showed a lung nodule.   Lung nodule/scar. -Changes in the right upper lobe and left lower lobe could be residual from previous pneumonia versus old infection versus cancer. -Patient has significant secondhand smoke exposure, PET scan results reviewed, no evidence of cancer, results relayed to pt via telephone.   Left lower extremity DVT, now possibly, complicated by post phlebitic syndrome. -Significant left lower extremity edema, possible persistent thromboembolic disease versus post phlebitic syndrome. We'll check lower extremity Doppler to rule out acute thrombosis.  Emphysema. -Emphysematous changes seen on CT chest. -Minimally symptomatic, no need for long-acting inhalers at this time.     Date: 06/14/2017  MRN# [id number] Elizabeth Fernandez [birth year]  Referring Physician:   JOHNAE FRILEY is a 83 y.o. old female seen in consultation for chief complaint of:    No chief complaint on file.   HPI:   She had an episode of pneumonia about a month ago, she feels that she has recovered from the pneumonia. She notes that that she is not limited by breathing. She is frequently working outside in her yard and has no difficulty with this, she also does housework without difficulty. She was prescribed an inhaler with the inhaler after her recent hospitalization, but she has not needed them. She was diagnosed with a RLE DVT, she was discharged with xarelto 15 mg bid, she is not on 20 mg once daily. She had compressions stockings, which she stopped using. She denies dyspnea or chest pain.  She has never been a smoker, her husband smoked and parents smoked. Her mother died of cancer.    Images  personally reviewed, CT chest, 02/18/2017; there is a area of  conglomerated scar in the right upper lobe, about 2 cm in diameter. there are changes of emphysema, a vague left lower lobe strandy opacity.   PMHX:   Past Medical History:  Diagnosis Date  . Arthritis    both knees  . Cataract    Surgery scheduled for 03/2014   Surgical Hx:  Past Surgical History:  Procedure Laterality Date  . CHOLECYSTECTOMY    . FEMUR SURGERY  11/10/2014  . IR FLUORO GUIDED NEEDLE PLC ASPIRATION/INJECTION LOC  01/25/2017   Family Hx:  Family History  Problem Relation Age of Onset  . Cancer Mother   . Heart disease Father 91       MI   Social Hx:   Social History   Tobacco Use  . Smoking status: Never Smoker  . Smokeless tobacco: Never Used  Substance Use Topics  . Alcohol use: No    Alcohol/week: 0.0 oz  . Drug use: No   Medication:    Current Outpatient Medications:  .  albuterol (PROVENTIL HFA;VENTOLIN HFA) 108 (90 Base) MCG/ACT inhaler, Inhale 2 puffs into the lungs every 6 (six) hours as needed for wheezing or shortness of breath. (Patient not taking: Reported on 03/26/2017), Disp: 1 Inhaler, Rfl: 2 .  budesonide-formoterol (SYMBICORT) 80-4.5 MCG/ACT inhaler, Inhale 2 puffs into the lungs 2 (two) times daily. (Patient not taking: Reported on 03/26/2017), Disp: 1 Inhaler, Rfl: 1 .  calcium-vitamin D (OSCAL WITH D) 500-200 MG-UNIT per tablet, Take 1 tablet by mouth 2 (two) times daily. Reported on 10/06/2015, Disp: , Rfl:  .  meloxicam ([place])  7.5 MG tablet, Take 1 tablet (7.5 mg total) by mouth daily. Take with food., Disp: 90 tablet, Rfl: 1 .  rivaroxaban (XARELTO) 20 MG TABS tablet, Take 1 tablet (20 mg total) by mouth daily with supper., Disp: 30 tablet, Rfl: 2   Allergies:  Pollen extract  Review of Systems: Gen:  Denies  fever, sweats, chills HEENT: Denies blurred vision, double vision. bleeds, sore throat Cvc:  No dizziness, chest pain. Resp:   Denies cough or sputum production, shortness of breath Gi: Denies swallowing difficulty, stomach  pain. Gu:  Denies bladder incontinence, burning urine Ext:   No Joint pain, stiffness. Skin: No skin rash,  hives  Endoc:  No polyuria, polydipsia. Psych: No depression, insomnia. Other:  All other systems were reviewed with the patient and were negative other that what is mentioned in the HPI.   Physical Examination:   VS: There were no vitals taken for this visit.  General Appearance: No distress  Neuro:without focal findings,  speech normal,  HEENT: PERRLA, EOM intact.   Pulmonary: normal breath sounds, No wheezing.  CardiovascularNormal S1,S2.  No m/r/g.   Abdomen: Benign, Soft, non-tender. Renal:  No costovertebral tenderness  GU:  No performed at this time. Endoc: No evident thyromegaly, no signs of acromegaly. Skin:   warm, no rashes, no ecchymosis  Extremities: normal, no cyanosis, clubbing. 3+ left lower extremity edema.  Other findings:    LABORATORY PANEL:   CBC No results for input(s): WBC, HGB, HCT, PLT in the last 168 hours. ------------------------------------------------------------------------------------------------------------------  Chemistries  No results for input(s): NA, K, CL, CO2, GLUCOSE, BUN, CREATININE, CALCIUM, MG, AST, ALT, ALKPHOS, BILITOT in the last 168 hours.  Invalid input(s): GFRCGP ------------------------------------------------------------------------------------------------------------------  Cardiac Enzymes No results for input(s): TROPONINI in the last 168 hours. ------------------------------------------------------------  RADIOLOGY:  No results found.     Thank  you for the consultation and for allowing [facility] to assist in the care of your patient. Our recommendations are noted above.  Please contact us if we can be of further service.   Marda Stalker, MD.  Board Certified in Internal Medicine, Pulmonary Medicine, [place], and Sleep Medicine.  [facility] Office Number: [phone]  Patricia Pesa, M.D.  Merton Border, M.D  06/14/2017

## 2017-06-18 ENCOUNTER — Ambulatory Visit: Payer: Medicare Other | Admitting: Internal Medicine

## 2017-06-19 ENCOUNTER — Encounter: Payer: Self-pay | Admitting: Internal Medicine

## 2017-07-02 ENCOUNTER — Encounter (INDEPENDENT_AMBULATORY_CARE_PROVIDER_SITE_OTHER): Payer: Medicare Other | Admitting: Ophthalmology

## 2017-07-02 DIAGNOSIS — H35033 Hypertensive retinopathy, bilateral: Secondary | ICD-10-CM | POA: Diagnosis not present

## 2017-07-02 DIAGNOSIS — H43813 Vitreous degeneration, bilateral: Secondary | ICD-10-CM | POA: Diagnosis not present

## 2017-07-02 DIAGNOSIS — I10 Essential (primary) hypertension: Secondary | ICD-10-CM | POA: Diagnosis not present

## 2017-07-02 DIAGNOSIS — H34811 Central retinal vein occlusion, right eye, with macular edema: Secondary | ICD-10-CM | POA: Diagnosis not present

## 2017-08-01 DIAGNOSIS — M5416 Radiculopathy, lumbar region: Secondary | ICD-10-CM | POA: Diagnosis not present

## 2017-08-01 DIAGNOSIS — M5136 Other intervertebral disc degeneration, lumbar region: Secondary | ICD-10-CM | POA: Diagnosis not present

## 2017-08-01 DIAGNOSIS — M48062 Spinal stenosis, lumbar region with neurogenic claudication: Secondary | ICD-10-CM | POA: Diagnosis not present

## 2017-08-13 ENCOUNTER — Encounter (INDEPENDENT_AMBULATORY_CARE_PROVIDER_SITE_OTHER): Payer: Medicare Other | Admitting: Ophthalmology

## 2017-08-13 DIAGNOSIS — I10 Essential (primary) hypertension: Secondary | ICD-10-CM | POA: Diagnosis not present

## 2017-08-13 DIAGNOSIS — H43813 Vitreous degeneration, bilateral: Secondary | ICD-10-CM

## 2017-08-13 DIAGNOSIS — H348112 Central retinal vein occlusion, right eye, stable: Secondary | ICD-10-CM | POA: Diagnosis not present

## 2017-08-13 DIAGNOSIS — H35033 Hypertensive retinopathy, bilateral: Secondary | ICD-10-CM

## 2017-08-19 IMAGING — MR MR LUMBAR SPINE W/O CM
4 of 5 series · 14 of 48 positions shown · non-contrast
Comparison: None.

CLINICAL DATA: Bilateral posterior pelvic pain extending into the
left leg. Fall 4 weeks ago with symptoms beginning approximately 3
weeks ago.

EXAM:
MRI LUMBAR SPINE WITHOUT CONTRAST
TECHNIQUE: Multiplanar, multisequence MR imaging of the lumbar spine was
performed. No intravenous contrast was administered.

[Series 3: T2 · sagittal · 4.0mm · 0.44mm/px · 5 of 19 slices shown (1 of 2)]
[im 1/19]
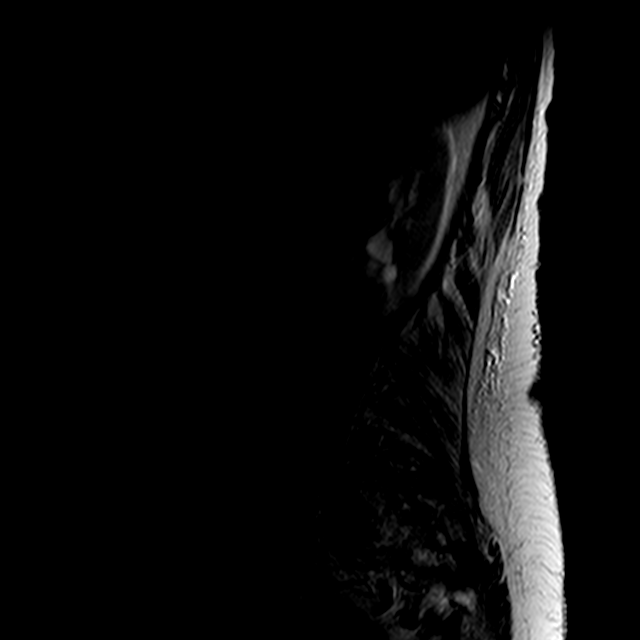
[im 3/19]
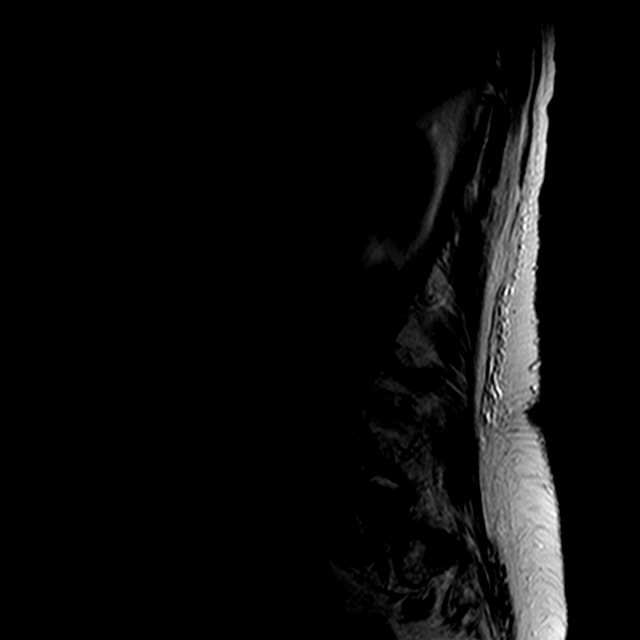
[im 6/19]
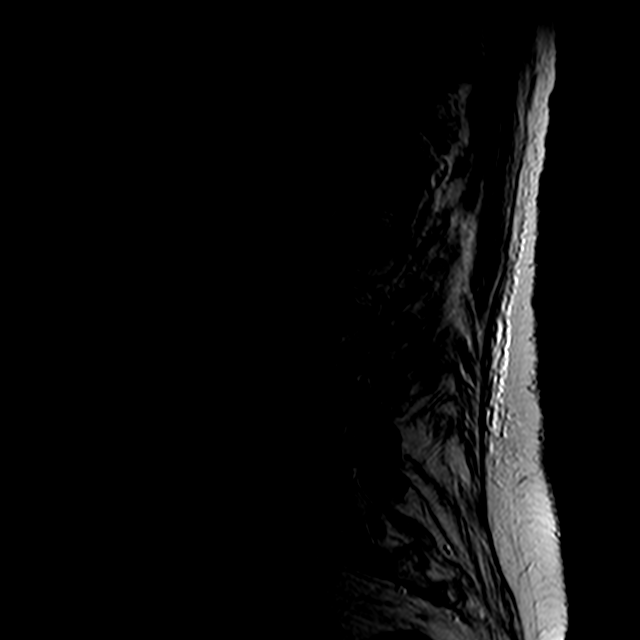
[im 11/19]
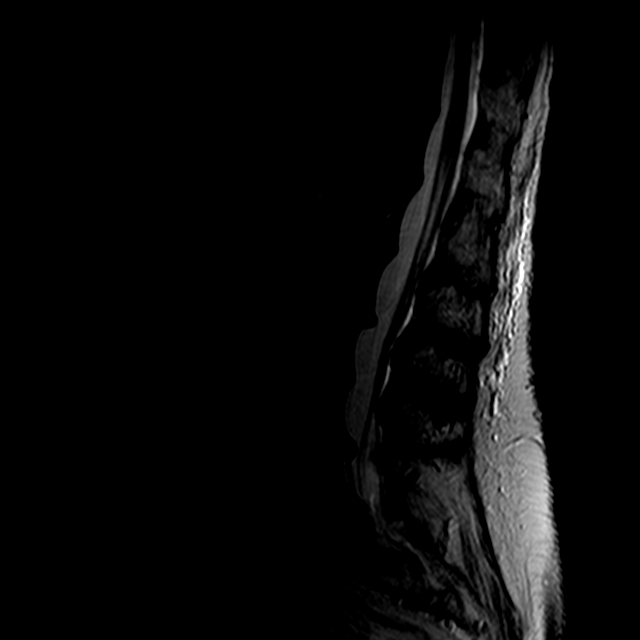
[im 16/19]
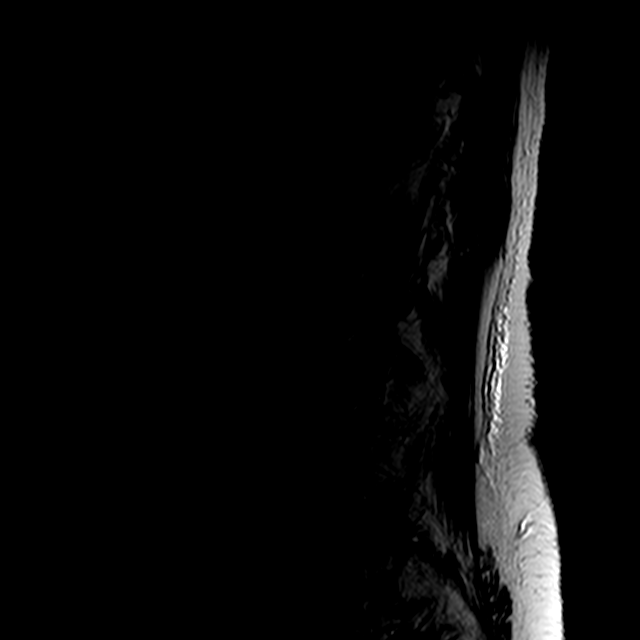

[Series 4: T1 · sagittal · 4.0mm · 0.44mm/px · 3 of 19 slices shown (1 of 2)]
[im 4/19]
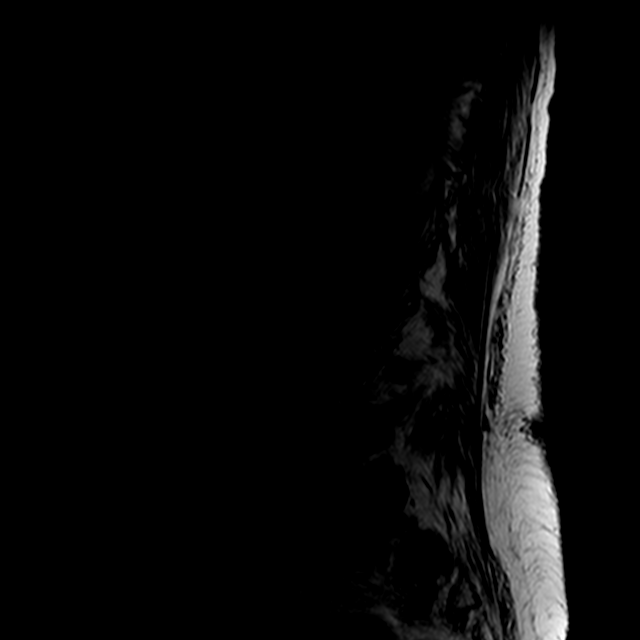
[im 10/19]
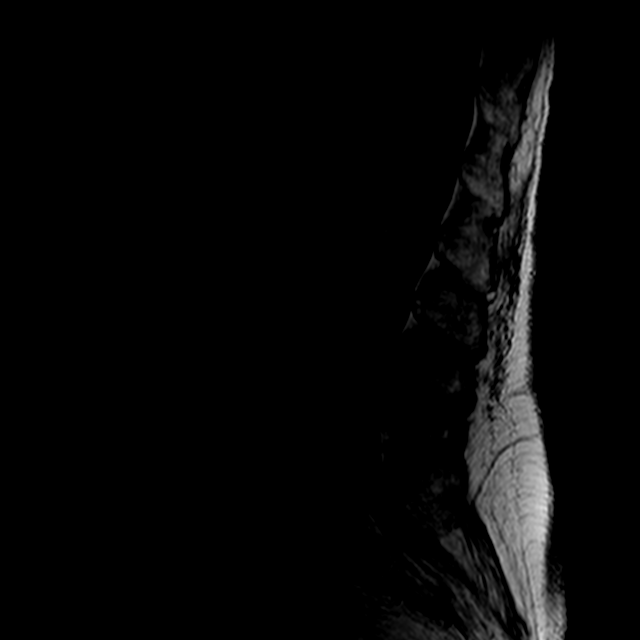
[im 16/19]
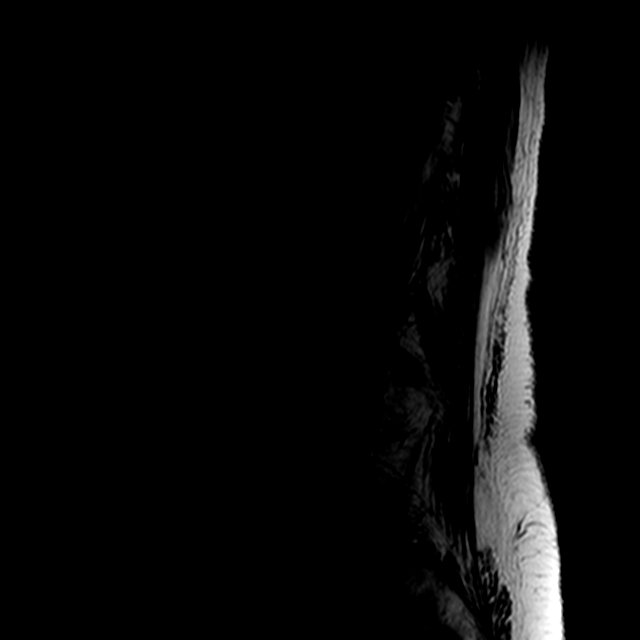

[Series 6: T2 · axial · 4.0mm · 0.39mm/px · z∈[-83,+53]mm · 3 of 34 slices shown (2 of 2)]
[im 6/34]
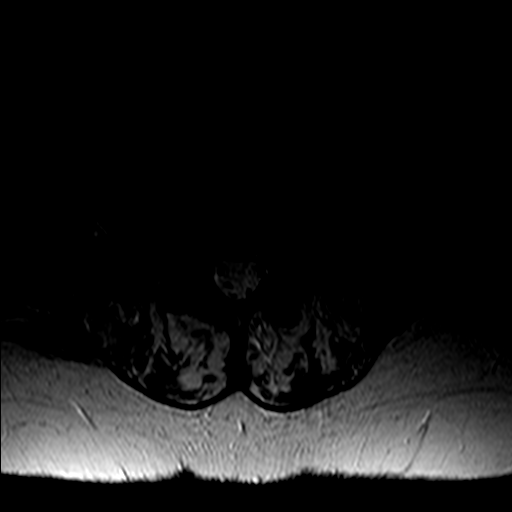
[im 17/34]
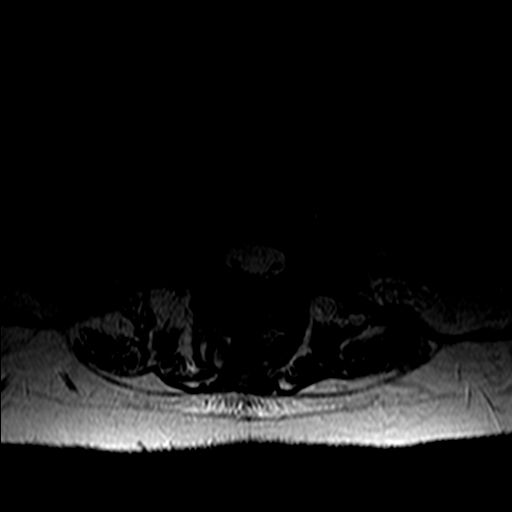
[im 28/34]
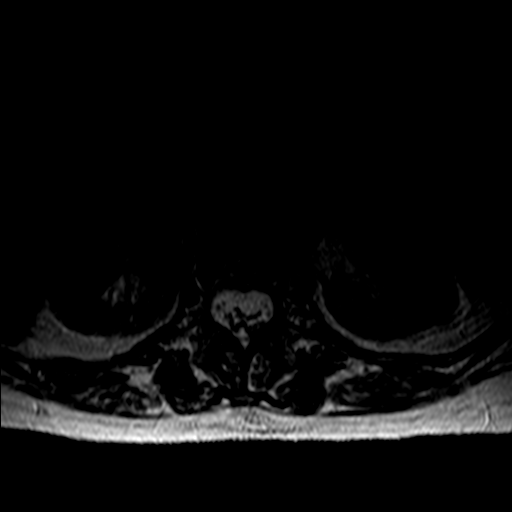

[Series 7: T1 · axial · 4.0mm · 0.39mm/px · z∈[-83,+53]mm · 3 of 34 slices shown (2 of 2)]
[im 6/34]
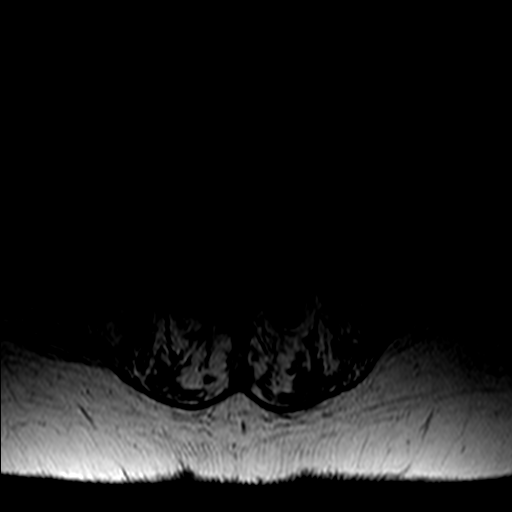
[im 17/34]
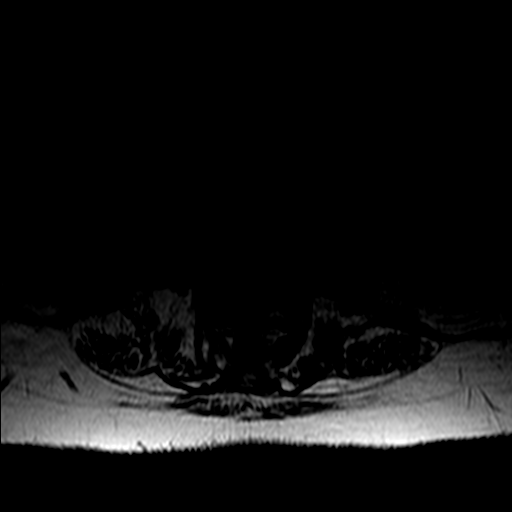
[im 28/34]
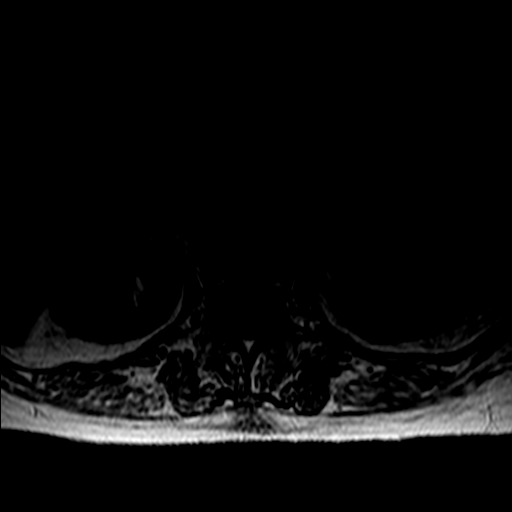

[14 of 48 positions shown; findings below may reference images not displayed]

FINDINGS: Segmentation: 5 non rib-bearing lumbar type vertebral bodies are
present.

Alignment: Slight retrolisthesis is present at L1-2, L2-3, and L3-4.
Levoconvex curvature is centered at L3-4.

Vertebrae: Chronic endplate marrow changes are present at L2-3
bilaterally. A endplate marrow changes are present at L5-S1 with
fusion across the disc space.

Edema within the sacral ala bilaterally is compatible with acute/
subacute bilateral sacral insufficiency fractures.

Conus medullaris: Extends to the T12 level and appears normal.

Paraspinal and other soft tissues: Limited imaging of the abdomen is
unremarkable.

Disc levels:

T12-L1:  Negative.

L1-2:  Mild disc bulging is present without significant stenosis.

L2-3: A broad-based disc protrusion is present. Mild facet
hypertrophy is noted bilaterally. This results in mild subarticular
and foraminal narrowing bilaterally.

L3-4: Mild facet hypertrophy is worse on the right. A rightward disc
protrusion is noted. The central canal is patent. Mild right
foraminal narrowing is evident.

L4-5: A broad-based disc protrusion is present. Mild facet
hypertrophy is noted bilaterally. Moderate left subarticular
narrowing is present. Moderate left and mild right foraminal
narrowing is evident.

L5-S1: There is fusion across the disc space. No focal stenosis is
present.
IMPRESSION: 1. Bilateral acute/subacute sacral insufficiency fractures.
2. Mild subarticular and foraminal narrowing bilaterally at L2-3.
3. Mild right foraminal narrowing at L3-4.
4. Moderate left subarticular and foraminal stenosis at L4-5. This
is the most significant left-sided disease and may account for the
left lower extremity radiculopathy.
5. Mild right foraminal narrowing.
6. Fusion across the disc space at L5-S1 without focal stenosis at
this level.

## 2017-10-01 DIAGNOSIS — M5416 Radiculopathy, lumbar region: Secondary | ICD-10-CM | POA: Diagnosis not present

## 2017-10-01 DIAGNOSIS — M5136 Other intervertebral disc degeneration, lumbar region: Secondary | ICD-10-CM | POA: Diagnosis not present

## 2017-10-01 DIAGNOSIS — M48062 Spinal stenosis, lumbar region with neurogenic claudication: Secondary | ICD-10-CM | POA: Diagnosis not present

## 2017-10-08 ENCOUNTER — Encounter (INDEPENDENT_AMBULATORY_CARE_PROVIDER_SITE_OTHER): Payer: Medicare Other | Admitting: Ophthalmology

## 2017-10-08 DIAGNOSIS — H43813 Vitreous degeneration, bilateral: Secondary | ICD-10-CM

## 2017-10-08 DIAGNOSIS — I10 Essential (primary) hypertension: Secondary | ICD-10-CM | POA: Diagnosis not present

## 2017-10-08 DIAGNOSIS — H35033 Hypertensive retinopathy, bilateral: Secondary | ICD-10-CM | POA: Diagnosis not present

## 2017-10-08 DIAGNOSIS — H34811 Central retinal vein occlusion, right eye, with macular edema: Secondary | ICD-10-CM | POA: Diagnosis not present

## 2017-10-25 ENCOUNTER — Ambulatory Visit: Payer: Medicare Other

## 2017-11-07 ENCOUNTER — Other Ambulatory Visit: Payer: Self-pay

## 2017-11-07 ENCOUNTER — Encounter: Payer: Self-pay | Admitting: *Deleted

## 2017-11-07 ENCOUNTER — Inpatient Hospital Stay
Admission: EM | Admit: 2017-11-07 | Discharge: 2017-11-12 | DRG: 470 | Disposition: A | Discharge status: Skilled Nursing Facility | Payer: Medicare Other | Attending: Internal Medicine | Admitting: Internal Medicine

## 2017-11-07 ENCOUNTER — Emergency Department: Payer: Medicare Other

## 2017-11-07 DIAGNOSIS — Z471 Aftercare following joint replacement surgery: Secondary | ICD-10-CM | POA: Diagnosis not present

## 2017-11-07 DIAGNOSIS — S72001A Fracture of unspecified part of neck of right femur, initial encounter for closed fracture: Secondary | ICD-10-CM | POA: Diagnosis not present

## 2017-11-07 DIAGNOSIS — Z7982 Long term (current) use of aspirin: Secondary | ICD-10-CM

## 2017-11-07 DIAGNOSIS — S72011A Unspecified intracapsular fracture of right femur, initial encounter for closed fracture: Secondary | ICD-10-CM | POA: Diagnosis not present

## 2017-11-07 DIAGNOSIS — W010XXA Fall on same level from slipping, tripping and stumbling without subsequent striking against object, initial encounter: Secondary | ICD-10-CM | POA: Diagnosis present

## 2017-11-07 DIAGNOSIS — M6281 Muscle weakness (generalized): Secondary | ICD-10-CM | POA: Diagnosis not present

## 2017-11-07 DIAGNOSIS — S72009A Fracture of unspecified part of neck of unspecified femur, initial encounter for closed fracture: Secondary | ICD-10-CM | POA: Diagnosis not present

## 2017-11-07 DIAGNOSIS — S71001A Unspecified open wound, right hip, initial encounter: Secondary | ICD-10-CM | POA: Diagnosis not present

## 2017-11-07 DIAGNOSIS — S72031A Displaced midcervical fracture of right femur, initial encounter for closed fracture: Secondary | ICD-10-CM | POA: Diagnosis not present

## 2017-11-07 DIAGNOSIS — Z791 Long term (current) use of non-steroidal anti-inflammatories (NSAID): Secondary | ICD-10-CM

## 2017-11-07 DIAGNOSIS — Z91048 Other nonmedicinal substance allergy status: Secondary | ICD-10-CM

## 2017-11-07 DIAGNOSIS — M25551 Pain in right hip: Secondary | ICD-10-CM | POA: Diagnosis not present

## 2017-11-07 DIAGNOSIS — S299XXA Unspecified injury of thorax, initial encounter: Secondary | ICD-10-CM | POA: Diagnosis not present

## 2017-11-07 DIAGNOSIS — M1990 Unspecified osteoarthritis, unspecified site: Secondary | ICD-10-CM | POA: Diagnosis not present

## 2017-11-07 DIAGNOSIS — M25552 Pain in left hip: Secondary | ICD-10-CM | POA: Diagnosis present

## 2017-11-07 DIAGNOSIS — J439 Emphysema, unspecified: Secondary | ICD-10-CM | POA: Diagnosis not present

## 2017-11-07 DIAGNOSIS — Y92008 Other place in unspecified non-institutional (private) residence as the place of occurrence of the external cause: Secondary | ICD-10-CM

## 2017-11-07 DIAGNOSIS — R000 Tachycardia, unspecified: Secondary | ICD-10-CM | POA: Diagnosis not present

## 2017-11-07 DIAGNOSIS — D62 Acute posthemorrhagic anemia: Secondary | ICD-10-CM | POA: Diagnosis not present

## 2017-11-07 DIAGNOSIS — Z7901 Long term (current) use of anticoagulants: Secondary | ICD-10-CM | POA: Diagnosis not present

## 2017-11-07 DIAGNOSIS — M170 Bilateral primary osteoarthritis of knee: Secondary | ICD-10-CM | POA: Diagnosis present

## 2017-11-07 DIAGNOSIS — Z7401 Bed confinement status: Secondary | ICD-10-CM | POA: Diagnosis not present

## 2017-11-07 DIAGNOSIS — G8918 Other acute postprocedural pain: Secondary | ICD-10-CM | POA: Diagnosis not present

## 2017-11-07 DIAGNOSIS — Z96641 Presence of right artificial hip joint: Secondary | ICD-10-CM | POA: Diagnosis not present

## 2017-11-07 DIAGNOSIS — Z86718 Personal history of other venous thrombosis and embolism: Secondary | ICD-10-CM | POA: Diagnosis not present

## 2017-11-07 DIAGNOSIS — I82562 Chronic embolism and thrombosis of left calf muscular vein: Secondary | ICD-10-CM

## 2017-11-07 DIAGNOSIS — S72091A Other fracture of head and neck of right femur, initial encounter for closed fracture: Secondary | ICD-10-CM | POA: Diagnosis not present

## 2017-11-07 DIAGNOSIS — M84459A Pathological fracture, hip, unspecified, initial encounter for fracture: Secondary | ICD-10-CM | POA: Diagnosis not present

## 2017-11-07 DIAGNOSIS — Z9181 History of falling: Secondary | ICD-10-CM | POA: Diagnosis not present

## 2017-11-07 DIAGNOSIS — R2689 Other abnormalities of gait and mobility: Secondary | ICD-10-CM | POA: Diagnosis not present

## 2017-11-07 DIAGNOSIS — R52 Pain, unspecified: Secondary | ICD-10-CM | POA: Diagnosis not present

## 2017-11-07 DIAGNOSIS — Z96649 Presence of unspecified artificial hip joint: Secondary | ICD-10-CM

## 2017-11-07 DIAGNOSIS — S72041A Displaced fracture of base of neck of right femur, initial encounter for closed fracture: Secondary | ICD-10-CM | POA: Diagnosis not present

## 2017-11-07 DIAGNOSIS — Z7951 Long term (current) use of inhaled steroids: Secondary | ICD-10-CM | POA: Diagnosis not present

## 2017-11-07 DIAGNOSIS — M80051D Age-related osteoporosis with current pathological fracture, right femur, subsequent encounter for fracture with routine healing: Secondary | ICD-10-CM | POA: Diagnosis not present

## 2017-11-07 DIAGNOSIS — M48062 Spinal stenosis, lumbar region with neurogenic claudication: Secondary | ICD-10-CM | POA: Diagnosis not present

## 2017-11-07 MED ORDER — ONDANSETRON HCL 4 MG/2ML IJ SOLN: 4.0000 mg | Freq: Once | INTRAMUSCULAR | Status: DC

## 2017-11-07 MED ORDER — MORPHINE SULFATE (PF) 4 MG/ML IV SOLN
4.0000 mg | Freq: Once | INTRAVENOUS | Status: DC
  Filled 2017-11-07: qty 1

## 2017-11-07 MED ORDER — MORPHINE SULFATE (PF) 4 MG/ML IV SOLN
4.0000 mg | Freq: Once | INTRAVENOUS | Status: AC
  Administered 2017-11-07: 4 mg via INTRAVENOUS

## 2017-11-07 MED ORDER — ONDANSETRON HCL 4 MG/2ML IJ SOLN
4.0000 mg | Freq: Once | INTRAMUSCULAR | Status: AC
  Administered 2017-11-07: 4 mg via INTRAVENOUS
  Filled 2017-11-07: qty 2

## 2017-11-07 NOTE — ED Triage Notes (Signed)
Pt received fentanyl 100 mcg and zofran 4 mg per IV by EMS. Pt slipped on level surface, fell from standing, injuring R hip. Pt presents w/ c/o R hip pain, shortening and rotation of RLE. DP and PT pulses strong and intact bilaterally. Pt A&O x 4.

## 2017-11-07 NOTE — ED Provider Notes (Signed)
[facility] Emergency Department Provider Note   First MD Initiated Contact with Patient 11/07/17 2305     (approximate)  I have reviewed the triage vital signs and the nursing notes.   HISTORY  Chief Complaint Hip Pain and Fall    HPI Elizabeth Fernandez is a 84 y.o. female with below list of chronic medical conditions including previous left hip fracture repaired by Dr. Mack Guise presents to the emergency department via EMS status post accidental fall while walking her dog with resultant right hip pain.  Patient denies any head injury no loss of consciousness.  Patient denies any low back pain or any other complaints.  States current pain score is 10 out of 10 worse with any movement of the right leg.  Patient was given 100 mcg of fentanyl via EMS as well as 4 mg of Zofran with minimal pain improvement.   Past Medical History:  Diagnosis Date  . Arthritis    both knees  . Cataract    Surgery scheduled for 03/2014    Patient Active Problem List   Diagnosis Date Noted  . Closed right hip fracture ([place]) 11/08/2017  . History of DVT (deep vein thrombosis) 11/08/2017  . Fatigue 02/25/2017  . Acute deep vein thrombosis (DVT) of axillary vein of left upper extremity ([place]) 02/25/2017  . HCAP (healthcare-associated pneumonia) 02/19/2017  . Bronchitis 02/18/2017  . Left leg swelling 02/18/2017  . Back pain 01/21/2017  . Sacral fracture ([place]) 01/21/2017  . Acute left-sided low back pain without sciatica 01/15/2017  . Osteoporosis 01/01/2017  . Leg swelling 11/19/2016  . Postmenopausal estrogen deficiency 11/19/2016  . Cataract 10/06/2015  . Situational anxiety 08/02/2015  . Hemorrhagic eye 12/18/2014  . Encounter to establish care 12/09/2014  . History of fracture of leg 12/09/2014    Past Surgical History:  Procedure Laterality Date  . CHOLECYSTECTOMY    . FEMUR SURGERY  11/10/2014  . IR FLUORO GUIDED NEEDLE PLC ASPIRATION/INJECTION LOC  01/25/2017     Prior to Admission medications   Medication Sig Start Date End Date Taking? Authorizing Provider  aspirin 325 MG tablet Take 650 mg by mouth every 6 (six) hours as needed for mild pain or headache.   Yes [provider]    Allergies Pollen extract  Family History  Problem Relation Age of Onset  . Cancer Mother   . Heart disease Father 22       MI    Social History Social History   Tobacco Use  . Smoking status: Never Smoker  . Smokeless tobacco: Never Used  Substance Use Topics  . Alcohol use: No    Alcohol/week: 0.0 oz  . Drug use: No    Review of Systems Constitutional: No fever/chills Eyes: No visual changes. ENT: No sore throat. Cardiovascular: Denies chest pain. Respiratory: Denies shortness of breath. Gastrointestinal: No abdominal pain.  No nausea, no vomiting.  No diarrhea.  No constipation. Genitourinary: Negative for dysuria. Musculoskeletal: Negative for neck pain.  Negative for back pain.  Positive for right hip pain Integumentary: Negative for rash. Neurological: Negative for headaches, focal weakness or numbness.   ____________________________________________   PHYSICAL EXAM:  VITAL SIGNS: ED Triage Vitals [11/07/17 2301]  Enc Vitals Group     BP      Pulse      Resp      Temp      Temp src      SpO2 96 %     Weight  Height      Head Circumference      Peak Flow      Pain Score      Pain Loc      Pain Edu?      Excl. in [place]?     Constitutional: Alert and oriented.  Apparent discomfort.   Eyes: Conjunctivae are normal.  Head: Atraumatic. Mouth/Throat: Mucous membranes are moist.  Oropharynx non-erythematous. Neck: No stridor. No cervical spine tenderness to palpation. Cardiovascular: Normal rate, regular rhythm. Good peripheral circulation. Grossly normal heart sounds. Respiratory: Normal respiratory effort.  No retractions. Lungs CTAB. Gastrointestinal: Soft and nontender. No distention.  Musculoskeletal: Right  hip pain to palpation.  Right leg shortened  Neurologic:  Normal speech and language. No gross focal neurologic deficits are appreciated.  Skin:  Skin is warm, dry and intact. No rash noted. Psychiatric: Mood and affect are normal. Speech and behavior are normal.  ____________________________________________   LABS (all labs ordered are listed, but only abnormal results are displayed)  Labs Reviewed  COMPREHENSIVE METABOLIC PANEL - Abnormal; Notable for the following components:      Result Value   Glucose, Bld 117 (*)    BUN 22 (*)    Calcium 8.6 (*)    All other components within normal limits  SURGICAL PCR SCREEN  CBC  PROTIME-INR  TYPE AND SCREEN   ____________________________________________  EKG  ED ECG REPORT I, [place] N BROWN, the attending physician, personally viewed and interpreted this ECG.   Date: 11/08/2017  EKG Time: 11:06 PM  Rate: 84  Rhythm: Normal sinus rhythm  Axis: Normal  Intervals: Normal  ST&T Change: None  ____________________________________________  RADIOLOGY I,  N BROWN, personally viewed and evaluated these images (plain radiographs) as part of my medical decision making, as well as reviewing the written report by the radiologist.  ED MD interpretation:  Femoral Neck Fracture  CLINICAL DATA:  Fall  EXAM: DG HIP (WITH OR WITHOUT PELVIS) 2-3V RIGHT  COMPARISON:  None.  FINDINGS: There is a superiorly displaced fracture of the right femoral neck. No other pelvic fracture is identified. The femoral head remains situated in the acetabulum. Left femoral hardware where is normal.  IMPRESSION: Superiorly displaced transverse fracture of the right femoral neck.   Electronically Signed   By: Ulyses Jarred M.D.   On: 11/08/2017 00:12   Procedures   ____________________________________________   INITIAL IMPRESSION / ASSESSMENT AND PLAN / ED COURSE  As part of my medical decision making, I reviewed the following  data within the electronic MEDICAL RECORD NUMBER  84 year old female present with history and physical exam concerning for right hip fracture which was confirmed on x-ray.  Patient was given IV morphine in the emergency department with some improvement of pain and subsequent doses were administered as needed.  Patient was discussed with Dr. Sabra Heck orthopedic surgeon on call with plan for surgical repair.  Patient was also discussed with Dr. Jannifer Franklin for hospital admission further evaluation and management ____________________________________________  FINAL CLINICAL IMPRESSION(S) / ED DIAGNOSES  Final diagnoses:  Closed fracture of neck of right femur, initial encounter ([place])     MEDICATIONS GIVEN DURING THIS VISIT:  Medications  0.9 %  sodium chloride infusion ( Intravenous New Bag/Given 11/08/17 1643)  ceFAZolin (ANCEF) IVPB 2g/100 mL premix (has no administration in time range)  clindamycin (CLEOCIN) IVPB 600 mg (has no administration in time range)  chlorhexidine (HIBICLENS) 4 % liquid (has no administration in time range)  morphine 2 MG/ML injection 1  mg (1 mg Intravenous Given 11/08/17 2044)  acetaminophen (TYLENOL) tablet 650 mg (has no administration in time range)    Or  acetaminophen (TYLENOL) suppository 650 mg (has no administration in time range)  oxyCODONE (Oxy IR/ROXICODONE) immediate release tablet 5 mg (5 mg Oral Given 11/08/17 0346)  ondansetron (ZOFRAN) tablet 4 mg (has no administration in time range)    Or  ondansetron (ZOFRAN) injection 4 mg (has no administration in time range)  ondansetron (ZOFRAN) injection 4 mg (4 mg Intravenous Given 11/07/17 2317)  morphine 4 MG/ML injection 4 mg (4 mg Intravenous Given 11/07/17 2317)     ED Discharge Orders    None       Note:  This document was prepared using Dragon voice recognition software and may include unintentional dictation errors.    Gregor Hams, MD 11/08/17 [id number]

## 2017-11-08 ENCOUNTER — Encounter
Admission: RE | Admit: 2017-11-08 | Discharge: 2017-11-08 | Disposition: A | Discharge status: Home / Self Care | Payer: Medicare Other | Source: Ambulatory Visit | Attending: Internal Medicine | Admitting: Internal Medicine

## 2017-11-08 DIAGNOSIS — Z471 Aftercare following joint replacement surgery: Secondary | ICD-10-CM | POA: Diagnosis not present

## 2017-11-08 DIAGNOSIS — M170 Bilateral primary osteoarthritis of knee: Secondary | ICD-10-CM | POA: Diagnosis present

## 2017-11-08 DIAGNOSIS — S72031A Displaced midcervical fracture of right femur, initial encounter for closed fracture: Secondary | ICD-10-CM | POA: Diagnosis not present

## 2017-11-08 DIAGNOSIS — R000 Tachycardia, unspecified: Secondary | ICD-10-CM | POA: Diagnosis not present

## 2017-11-08 DIAGNOSIS — Y92008 Other place in unspecified non-institutional (private) residence as the place of occurrence of the external cause: Secondary | ICD-10-CM | POA: Diagnosis not present

## 2017-11-08 DIAGNOSIS — S72001A Fracture of unspecified part of neck of right femur, initial encounter for closed fracture: Secondary | ICD-10-CM

## 2017-11-08 DIAGNOSIS — G8918 Other acute postprocedural pain: Secondary | ICD-10-CM | POA: Diagnosis present

## 2017-11-08 DIAGNOSIS — D62 Acute posthemorrhagic anemia: Secondary | ICD-10-CM | POA: Diagnosis not present

## 2017-11-08 DIAGNOSIS — S72011A Unspecified intracapsular fracture of right femur, initial encounter for closed fracture: Secondary | ICD-10-CM | POA: Diagnosis present

## 2017-11-08 DIAGNOSIS — Z86718 Personal history of other venous thrombosis and embolism: Secondary | ICD-10-CM

## 2017-11-08 DIAGNOSIS — M25551 Pain in right hip: Secondary | ICD-10-CM | POA: Diagnosis present

## 2017-11-08 DIAGNOSIS — W010XXA Fall on same level from slipping, tripping and stumbling without subsequent striking against object, initial encounter: Secondary | ICD-10-CM | POA: Diagnosis present

## 2017-11-08 DIAGNOSIS — S72009A Fracture of unspecified part of neck of unspecified femur, initial encounter for closed fracture: Secondary | ICD-10-CM | POA: Diagnosis not present

## 2017-11-08 DIAGNOSIS — Z91048 Other nonmedicinal substance allergy status: Secondary | ICD-10-CM | POA: Diagnosis not present

## 2017-11-08 DIAGNOSIS — Z7401 Bed confinement status: Secondary | ICD-10-CM | POA: Diagnosis not present

## 2017-11-08 DIAGNOSIS — M84459A Pathological fracture, hip, unspecified, initial encounter for fracture: Secondary | ICD-10-CM | POA: Diagnosis not present

## 2017-11-08 DIAGNOSIS — M25552 Pain in left hip: Secondary | ICD-10-CM | POA: Diagnosis present

## 2017-11-08 DIAGNOSIS — Z791 Long term (current) use of non-steroidal anti-inflammatories (NSAID): Secondary | ICD-10-CM | POA: Diagnosis not present

## 2017-11-08 DIAGNOSIS — Z96641 Presence of right artificial hip joint: Secondary | ICD-10-CM | POA: Diagnosis not present

## 2017-11-08 DIAGNOSIS — S71001A Unspecified open wound, right hip, initial encounter: Secondary | ICD-10-CM | POA: Diagnosis not present

## 2017-11-08 DIAGNOSIS — Z7982 Long term (current) use of aspirin: Secondary | ICD-10-CM | POA: Diagnosis not present

## 2017-11-08 DIAGNOSIS — I82562 Chronic embolism and thrombosis of left calf muscular vein: Secondary | ICD-10-CM

## 2017-11-08 LAB — PROTIME-INR
INR: 0.99
Prothrombin Time: 13 seconds (ref 11.4–15.2)

## 2017-11-08 LAB — COMPREHENSIVE METABOLIC PANEL
ALBUMIN: 3.7 g/dL (ref 3.5–5.0)
ALK PHOS: 95 U/L (ref 38–126)
ALT: 17 U/L (ref 14–54)
AST: 21 U/L (ref 15–41)
Anion gap: 6 (ref 5–15)
BILIRUBIN TOTAL: 0.6 mg/dL (ref 0.3–1.2)
BUN: 22 mg/dL — AB (ref 6–20)
CALCIUM: 8.6 mg/dL — AB (ref 8.9–10.3)
CO2: 30 mmol/L (ref 22–32)
Chloride: 108 mmol/L (ref 101–111)
Creatinine, Ser: 0.81 mg/dL (ref 0.44–1.00)
GFR calc Af Amer: >60 mL/min (ref >60)
GFR calc non Af Amer: >60 mL/min (ref >60)
GLUCOSE: 117 mg/dL — AB (ref 65–99)
Potassium: 4.1 mmol/L (ref 3.5–5.1)
Sodium: 144 mmol/L (ref 135–145)
TOTAL PROTEIN: 6.6 g/dL (ref 6.5–8.1)

## 2017-11-08 LAB — CBC
HEMATOCRIT: 35.7 % (ref 35.0–47.0)
HEMOGLOBIN: 12 g/dL (ref 12.0–16.0)
MCH: 30.1 pg (ref 26.0–34.0)
MCHC: 33.7 g/dL (ref 32.0–36.0)
MCV: 89.3 fL (ref 80.0–100.0)
Platelets: 202 10*3/uL (ref 150–440)
RBC: 4 MIL/uL (ref 3.80–5.20)
RDW: 12.8 % (ref 11.5–14.5)
WBC: 8.3 10*3/uL (ref 3.6–11.0)

## 2017-11-08 LAB — SURGICAL PCR SCREEN
MRSA, PCR: NEGATIVE
Staphylococcus aureus: NEGATIVE

## 2017-11-08 LAB — TYPE AND SCREEN
ABO/RH(D): O POS
Antibody Screen: NEGATIVE

## 2017-11-08 MED ORDER — CHLORHEXIDINE GLUCONATE 4 % EX LIQD
Freq: Once | CUTANEOUS | Status: AC
  Administered 2017-11-09: 06:00:00 via TOPICAL

## 2017-11-08 MED ORDER — ACETAMINOPHEN 325 MG PO TABS
650.0000 mg | ORAL_TABLET | Freq: Four times a day (QID) | ORAL | Status: DC | PRN
  Administered 2017-11-10: 650 mg via ORAL
  Filled 2017-11-08: qty 2

## 2017-11-08 MED ORDER — CLINDAMYCIN PHOSPHATE 600 MG/50ML IV SOLN
600.0000 mg | INTRAVENOUS | Status: AC
  Filled 2017-11-08: qty 50

## 2017-11-08 MED ORDER — OXYCODONE HCL 5 MG PO TABS
5.0000 mg | ORAL_TABLET | ORAL | Status: DC | PRN
  Administered 2017-11-08 – 2017-11-09 (×2): 5 mg via ORAL
  Filled 2017-11-08 (×2): qty 1

## 2017-11-08 MED ORDER — ONDANSETRON HCL 4 MG PO TABS: 4.0000 mg | ORAL_TABLET | Freq: Four times a day (QID) | ORAL | Status: DC | PRN

## 2017-11-08 MED ORDER — ONDANSETRON HCL 4 MG/2ML IJ SOLN: 4.0000 mg | Freq: Four times a day (QID) | INTRAMUSCULAR | Status: DC | PRN

## 2017-11-08 MED ORDER — CEFAZOLIN SODIUM-DEXTROSE 2-4 GM/100ML-% IV SOLN
2.0000 g | INTRAVENOUS | Status: AC
  Filled 2017-11-08: qty 100

## 2017-11-08 MED ORDER — ACETAMINOPHEN 650 MG RE SUPP: 650.0000 mg | Freq: Four times a day (QID) | RECTAL | Status: DC | PRN

## 2017-11-08 MED ORDER — SODIUM CHLORIDE 0.9 % IV SOLN
INTRAVENOUS | Status: DC
  Administered 2017-11-08 – 2017-11-09 (×3): via INTRAVENOUS
  Administered 2017-11-09: 75 mL/h via INTRAVENOUS

## 2017-11-08 MED ORDER — MORPHINE SULFATE (PF) 2 MG/ML IV SOLN
1.0000 mg | INTRAVENOUS | Status: DC | PRN
  Administered 2017-11-08 (×6): 1 mg via INTRAVENOUS
  Filled 2017-11-08 (×6): qty 1

## 2017-11-08 NOTE — NC FL2 (Signed)
[place] LEVEL OF CARE SCREENING TOOL     IDENTIFICATION  Patient Name: Elizabeth Fernandez Birthdate: 1933-08-10 Sex: female Admission Date (Current Location): 11/07/2017  [place] and [place] Number:  Engineering geologist and Address:  [facility], [address] [place], [place] [address]      Provider Number: [id number]  Attending Physician Name and Address:  Loletha Grayer, MD  Relative Name and Phone Number:       Current Level of Care: Hospital Recommended Level of Care: [place] Prior Approval Number:    Date Approved/Denied:   PASRR Number: ([id number] )  Discharge Plan: SNF    Current Diagnoses: Patient Active Problem List   Diagnosis Date Noted  . Closed right hip fracture ([place]) 11/08/2017  . History of DVT (deep vein thrombosis) 11/08/2017  . Fatigue 02/25/2017  . Acute deep vein thrombosis (DVT) of axillary vein of left upper extremity ([place]) 02/25/2017  . HCAP (healthcare-associated pneumonia) 02/19/2017  . Bronchitis 02/18/2017  . Left leg swelling 02/18/2017  . Back pain 01/21/2017  . Sacral fracture ([place]) 01/21/2017  . Acute left-sided low back pain without sciatica 01/15/2017  . Osteoporosis 01/01/2017  . Leg swelling 11/19/2016  . Postmenopausal estrogen deficiency 11/19/2016  . Cataract 10/06/2015  . Situational anxiety 08/02/2015  . Hemorrhagic eye 12/18/2014  . Encounter to establish care 12/09/2014  . History of fracture of leg 12/09/2014    Orientation RESPIRATION BLADDER Height & Weight     Self, Time, Situation, Place  O2(2 Liters Oxygen) Continent Weight: 139 lb 9.6 oz (63.3 kg) Height:  5\' 5"  (165.1 cm)  BEHAVIORAL SYMPTOMS/MOOD NEUROLOGICAL BOWEL NUTRITION STATUS      Continent Diet(NPO for surgery to be advanced. )  AMBULATORY STATUS COMMUNICATION OF NEEDS Skin   Extensive Assist Verbally Surgical wounds                       Personal Care Assistance Level of Assistance   Bathing, Feeding, Dressing Bathing Assistance: Limited assistance Feeding assistance: Independent Dressing Assistance: Limited assistance     Functional Limitations Info  Sight, Hearing, Speech Sight Info: Adequate Hearing Info: Adequate Speech Info: Adequate    SPECIAL CARE FACTORS FREQUENCY  PT (By licensed PT), OT (By licensed OT)     PT Frequency: (5) OT Frequency: (5)            Contractures      Additional Factors Info  Code Status, Allergies Code Status Info: (Full Code. ) Allergies Info: (Pollen Extract)           Current Medications (11/08/2017):  This is the current hospital active medication list Current Facility-Administered Medications  Medication Dose Route Frequency Provider Last Rate Last Dose  . 0.9 %  sodium chloride infusion   Intravenous Continuous Lance Coon, MD 75 mL/hr at 11/08/17 0158    . acetaminophen (TYLENOL) tablet 650 mg  650 mg Oral Q6H PRN Lance Coon, MD       Or  . acetaminophen (TYLENOL) suppository 650 mg  650 mg Rectal Q6H PRN Lance Coon, MD      . ceFAZolin (ANCEF) IVPB 2g/100 mL premix  2 g Intravenous On Call to OR Lance Coon, MD      . Derrill Memo ON 11/09/2017] chlorhexidine (HIBICLENS) 4 % liquid   Topical Once Lance Coon, MD      . clindamycin (CLEOCIN) IVPB 600 mg  600 mg Intravenous On Call to OR Lance Coon, MD      .  morphine 2 MG/ML injection 1 mg  1 mg Intravenous Q1H PRN Lance Coon, MD   1 mg at 11/08/17 0815  . ondansetron (ZOFRAN) tablet 4 mg  4 mg Oral Q6H PRN Lance Coon, MD       Or  . ondansetron [facility]) injection 4 mg  4 mg Intravenous Q6H PRN Lance Coon, MD      . oxyCODONE (Oxy IR/ROXICODONE) immediate release tablet 5 mg  5 mg Oral Q4H PRN Lance Coon, MD   5 mg at 11/08/17 3729     Discharge Medications: Please see discharge summary for a list of discharge medications.  Relevant Imaging Results:  Relevant Lab Results:   Additional Information (SSN: [id number])  Shandi Godfrey,  [place] Beets, LCSW

## 2017-11-08 NOTE — H&P (Signed)
[place] [facility] [place] NAME: Elizabeth Fernandez    MR#:  [id number]  DATE OF BIRTH:  1934/02/29  DATE OF ADMISSION:  11/07/2017  PRIMARY CARE PHYSICIAN: Burnard Hawthorne, FNP   REQUESTING/REFERRING PHYSICIAN: Owens Shark, MD  CHIEF COMPLAINT:   Chief Complaint  Patient presents with  . Hip Pain  . Fall    HISTORY OF PRESENT ILLNESS:  Elizabeth Fernandez  is a 84 y.o. female who presents with chemical fall at home and subsequent right hip fracture.  Patient states she let her dogs out into the yard and was on her cement back patio when she fell due to the slippers she was wearing.  ED physician contacted orthopedic surgeon who plans for operative repair.  Hospitalist were called for admission.  Of note, patient states that she was on Xarelto in the past for a DVT.  However, she states she has been off of this medicine for some time.  PAST MEDICAL HISTORY:   Past Medical History:  Diagnosis Date  . Arthritis    both knees  . Cataract    Surgery scheduled for 03/2014     PAST SURGICAL HISTORY:   Past Surgical History:  Procedure Laterality Date  . CHOLECYSTECTOMY    . FEMUR SURGERY  11/10/2014  . IR FLUORO GUIDED NEEDLE PLC ASPIRATION/INJECTION LOC  01/25/2017     SOCIAL HISTORY:   Social History   Tobacco Use  . Smoking status: Never Smoker  . Smokeless tobacco: Never Used  Substance Use Topics  . Alcohol use: No    Alcohol/week: 0.0 oz     FAMILY HISTORY:   Family History  Problem Relation Age of Onset  . Cancer Mother   . Heart disease Father 64       MI     DRUG ALLERGIES:   Allergies  Allergen Reactions  . Pollen Extract     MEDICATIONS AT HOME:   Prior to Admission medications   Medication Sig Start Date End Date Taking? Authorizing Provider  calcium-vitamin D (OSCAL WITH D) 500-200 MG-UNIT per tablet Take 1 tablet by mouth 2 (two) times daily. Reported on 10/06/2015    [provider]  meloxicam (MOBIC)  7.5 MG tablet Take 1 tablet (7.5 mg total) by mouth daily. Take with food. 12/25/16   Burnard Hawthorne, FNP  rivaroxaban (XARELTO) 20 MG TABS tablet Take 1 tablet (20 mg total) by mouth daily with supper. 03/12/17   Burnard Hawthorne, FNP    REVIEW OF SYSTEMS:  Review of Systems  Constitutional: Negative for chills, fever, malaise/fatigue and weight loss.  HENT: Negative for ear pain, hearing loss and tinnitus.   Eyes: Negative for blurred vision, double vision, pain and redness.  Respiratory: Negative for cough, hemoptysis and shortness of breath.   Cardiovascular: Negative for chest pain, palpitations, orthopnea and leg swelling.  Gastrointestinal: Negative for abdominal pain, constipation, diarrhea, nausea and vomiting.  Genitourinary: Negative for dysuria, frequency and hematuria.  Musculoskeletal: Positive for falls and joint pain (Right hip). Negative for back pain and neck pain.  Skin:       No acne, rash, or lesions  Neurological: Negative for dizziness, tremors, focal weakness and weakness.  Endo/Heme/Allergies: Negative for polydipsia. Does not bruise/bleed easily.  Psychiatric/Behavioral: Negative for depression. The patient is not nervous/anxious and does not have insomnia.      VITAL SIGNS:   Vitals:   11/07/17 2308 11/07/17 2315 11/07/17 2345 11/08/17 0000  BP:  Marland Kitchen)  150/78 (!) 142/121 131/75  Pulse:  78 77 76  Resp:  (!) 23 14 16   Temp:      TempSrc:      SpO2:  96% (!) 89% (!) 85%  Weight: 63 kg (139 lb)     Height: 5\' 5"  (1.651 m)      Wt Readings from Last 3 Encounters:  11/07/17 63 kg (139 lb)  03/26/17 67.6 kg (149 lb)  02/25/17 65.9 kg (145 lb 3.2 oz)    PHYSICAL EXAMINATION:  Physical Exam  Vitals reviewed. Constitutional: She is oriented to person, place, and time. She appears well-developed and well-nourished. No distress.  HENT:  Head: Normocephalic and atraumatic.  Mouth/Throat: Oropharynx is clear and moist.  Eyes: Pupils are equal, round, and  reactive to light. Conjunctivae and EOM are normal. No scleral icterus.  Neck: Normal range of motion. Neck supple. No JVD present. No thyromegaly present.  Cardiovascular: Normal rate, regular rhythm and intact distal pulses. Exam reveals no gallop and no friction rub.  No murmur heard. Respiratory: Effort normal and breath sounds normal. No respiratory distress. She has no wheezes. She has no rales.  GI: Soft. Bowel sounds are normal. She exhibits no distension. There is no tenderness.  Musculoskeletal: Normal range of motion. She exhibits deformity (Right leg shortened). She exhibits no edema.  No arthritis, no gout  Lymphadenopathy:    She has no cervical adenopathy.  Neurological: She is alert and oriented to person, place, and time. No cranial nerve deficit.  No dysarthria, no aphasia  Skin: Skin is warm and dry. No rash noted. No erythema.  Psychiatric: She has a normal mood and affect. Her behavior is normal. Judgment and thought content normal.    LABORATORY PANEL:   CBC No results for input(s): WBC, HGB, HCT, PLT in the last 168 hours. ------------------------------------------------------------------------------------------------------------------  Chemistries  No results for input(s): NA, K, CL, CO2, GLUCOSE, BUN, CREATININE, CALCIUM, MG, AST, ALT, ALKPHOS, BILITOT in the last 168 hours.  Invalid input(s): GFRCGP ------------------------------------------------------------------------------------------------------------------  Cardiac Enzymes No results for input(s): TROPONINI in the last 168 hours. ------------------------------------------------------------------------------------------------------------------  RADIOLOGY:  Dg Chest 1 View  Result Date: 11/08/2017 CLINICAL DATA:  Slip and fall injury with right hip pain. EXAM: CHEST  1 VIEW COMPARISON:  11/10/2014 FINDINGS: Shallow inspiration. Cardiac enlargement. No pulmonary vascular congestion. No airspace disease  or consolidation in the lungs. No blunting of costophrenic angles. No pneumothorax. Calcification of the aorta. Degenerative changes in the spine. Old left rib fractures. Surgical clips in the right upper quadrant. IMPRESSION: No evidence of active pulmonary disease.  Aortic atherosclerosis. Electronically Signed   By: Lucienne Capers M.D.   On: 11/08/2017 00:13   Dg Hip Unilat W Or Wo Pelvis 2-3 Views Right  Result Date: 11/08/2017 CLINICAL DATA:  Fall EXAM: DG HIP (WITH OR WITHOUT PELVIS) 2-3V RIGHT COMPARISON:  None. FINDINGS: There is a superiorly displaced fracture of the right femoral neck. No other pelvic fracture is identified. The femoral head remains situated in the acetabulum. Left femoral hardware where is normal. IMPRESSION: Superiorly displaced transverse fracture of the right femoral neck. Electronically Signed   By: Ulyses Jarred M.D.   On: 11/08/2017 00:12    EKG:   Orders placed or performed during the hospital encounter of 11/07/17  . ED EKG  . ED EKG    IMPRESSION AND PLAN:  Principal Problem:   Closed right hip fracture [facility]) -consult orthopedic surgery for operative repair, defer to them for further management of  this problem.  Cardiac risk stratification as below, patient has no risk factors.   Active Problems:   History of DVT (deep vein thrombosis) -this occurred after previous fracture and period of immobilization.  This was considered a provoked DVT.  Patient was on Xarelto, but finished her course and has been off of this medication for some time.  Chart review performed and case discussed with ED provider. Labs, imaging and/or ECG reviewed by provider and discussed with patient/family. Management plans discussed with the patient and/or family.  DVT PROPHYLAXIS: Mechanical only  GI PROPHYLAXIS: None  ADMISSION STATUS: Inpatient  CODE STATUS: Full Code Status History    Date Active Date Inactive Code Status Order ID Comments User Context   02/19/2017 0413  02/20/2017 1453 Full Code [id number]  Harrie Foreman, MD ED   01/21/2017 1359 01/25/2017 2018 Full Code [id number]  Vaughan Basta, MD Inpatient      TOTAL TIME TAKING CARE OF THIS PATIENT: 45 minutes.   Myan Locatelli [place] 11/08/2017, 12:40 AM  [employer]  [phone]  CC: Primary care physician; Burnard Hawthorne, FNP  Note:  This document was prepared using Dragon voice recognition software and may include unintentional dictation errors.

## 2017-11-08 NOTE — Clinical Social Work Note (Signed)
Clinical Social Work Assessment  Patient Details  Name: Elizabeth Fernandez MRN: [id number] Date of Birth: May 14, 1934  Date of referral:  11/08/17               Reason for consult:  Facility Placement                Permission sought to share information with:  Chartered certified accountant granted to share information::  Yes, Verbal Permission Granted  Name::      [place]::   [place]   Relationship::     Contact Information:     Housing/Transportation Living arrangements for the past 2 months:  [place] of Information:  Patient, Siblings Patient Interpreter Needed:  None Criminal Activity/Legal Involvement Pertinent to Current Situation/Hospitalization:  No - Comment as needed Significant Relationships:  Adult Children, Siblings Lives with:  Adult Children Do you feel safe going back to the place where you live?  Yes Need for family participation in patient care:  Yes (Comment)  Care giving concerns:  Patient lives in [place] with her son Elizabeth Fernandez.    Social Worker assessment / plan:  Holiday representative ([place]) reviewed chart and noted that patient has a hip fracture and Ortho consult is pending. CSW met with patient and her brother Clair Gulling was at bedside. Patient was alert and oriented X4 and was laying in the bed. CSW introduced self and explained role of CSW department. Patient reported that she remembers CSW from 2016 when she broke her femur and went to [facility]. CSW explained SNF process and that [facility] will have to approve it. Patient reported that she prefers to go to short term rehab at [facility] and is okay with sharing a room. Patient reported that she has been to [facility] and [facility] in the past. Patient is agreeable to SNF search in [place]. FL2 complete and faxed out. Patient's brother Clair Gulling reported that patient was very mobile before this fall and hopes patient will get back to that  point.   CSW will start [facility] SNF authorization on Monday 11/11/17 once PT notes are available. CSW will continue to follow and assist as needed.   Employment status:  Retired Nurse, adult PT Recommendations:  Not assessed at this time [place] / Referral to community resources:  [place]  Patient/Family's Response to care: Patient is agreeable to [facility] in [place].    Patient/Family's Understanding of and Emotional Response to Diagnosis, Current Treatment, and Prognosis:  Patient was very pleasant and thanked CSW for assistance.    Emotional Assessment Appearance:  Appears stated age Attitude/Demeanor/Rapport:    Affect (typically observed):  Accepting, Adaptable, Pleasant Orientation:  Oriented to Self, Oriented to Place, Oriented to  Time, Oriented to Situation Alcohol / Substance use:  Not Applicable Psych involvement (Current and /or in the community):  No (Comment)  Discharge Needs  Concerns to be addressed:  Discharge Planning Concerns Readmission within the last 30 days:  No Current discharge risk:  Dependent with Mobility Barriers to Discharge:  Continued Medical Work up   [employer], Ver[place] Beets, LCSW 11/08/2017, 8:50 AM

## 2017-11-08 NOTE — Clinical Social Work Placement (Signed)
   CLINICAL SOCIAL WORK PLACEMENT  NOTE  Date:  11/08/2017  Patient Details  Name: Elizabeth Fernandez MRN: [id number] Date of Birth: 03-23-1934  Clinical Social Work is seeking post-discharge placement for this patient at the [place] level of care (*CSW will initial, date and re-position this form in  chart as items are completed):  Yes   Patient/family provided with [facility] Work Department's list of facilities offering this level of care within the geographic area requested by the patient (or if unable, by the patient's family).  Yes   Patient/family informed of their freedom to choose among providers that offer the needed level of care, that participate in Medicare, Medicaid or managed care program needed by the patient, have an available bed and are willing to accept the patient.  Yes   Patient/family informed of [facility]'s ownership interest in [facility] and [facility], as well as of the fact that they are under no obligation to receive care at these facilities.  PASRR submitted to EDS on       PASRR number received on       Existing PASRR number confirmed on 11/08/17     FL2 transmitted to all facilities in geographic area requested by pt/family on 11/08/17     FL2 transmitted to all facilities within larger geographic area on       Patient informed that his/her managed care company has contracts with or will negotiate with certain facilities, including the following:            Patient/family informed of bed offers received.  Patient chooses bed at       Physician recommends and patient chooses bed at      Patient to be transferred to   on  .  Patient to be transferred to facility by       Patient family notified on   of transfer.  Name of family member notified:        PHYSICIAN       Additional Comment:    _______________________________________________ Charish Schroepfer, Veronia Beets, LCSW 11/08/2017, 8:49 AM

## 2017-11-08 NOTE — Progress Notes (Addendum)
Clinical Education officer, museum (CSW) presented bed offers to patient. She chose [facility] [facility] admissions coordinator at [facility] is aware of above. Patient's son Lennette Bihari is aware of above.   [employer], LCSW [phone]

## 2017-11-08 NOTE — Consult Note (Signed)
PREOPERATIVE H&P  Chief Complaint: right hip fracture  HPI: Elizabeth Fernandez is a 84 y.o. female who presents for preoperative history and physical with a diagnosis of right hip fracture. She slipped on her patio last night and suffered a displaced subcapital fracutre of the right hip. She has been admitted and cleared for surgery by the hospitalist service. I discussed surgical vs. Non operative treatment with the patient and son and wish to proceed with surgery. Due to severe backup in the OR today the surgery will be done tomorrow AM rather than in the middle of the night.    Past Medical History:  Diagnosis Date  . Arthritis    both knees  . Cataract    Surgery scheduled for 03/2014   Past Surgical History:  Procedure Laterality Date  . CHOLECYSTECTOMY    . FEMUR SURGERY  11/10/2014  . IR FLUORO GUIDED NEEDLE PLC ASPIRATION/INJECTION LOC  01/25/2017   Social History   Socioeconomic History  . Marital status: Widowed    Spouse name: Not on file  . Number of children: Not on file  . Years of education: Not on file  . Highest education level: Not on file  Occupational History  . Not on file  Social Needs  . Financial resource strain: Not on file  . Food insecurity:    Worry: Not on file    Inability: Not on file  . Transportation needs:    Medical: Not on file    Non-medical: Not on file  Tobacco Use  . Smoking status: Never Smoker  . Smokeless tobacco: Never Used  Substance and Sexual Activity  . Alcohol use: No    Alcohol/week: 0.0 oz  . Drug use: No  . Sexual activity: Not Currently  Lifestyle  . Physical activity:    Days per week: Not on file    Minutes per session: Not on file  . Stress: Not on file  Relationships  . Social connections:    Talks on phone: Not on file    Gets together: Not on file    Attends religious service: Not on file    Active member of club or organization: Not on file    Attends meetings of clubs or organizations: Not on file   Relationship status: Not on file  Other Topics Concern  . 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    Lives with son    Children- 2    Grandchildren- none    Pets: 1 dog inside    Enjoys- Reading and mowing yard    Family History  Problem Relation Age of Onset  . Cancer Mother   . Heart disease Father 66       MI   Allergies  Allergen Reactions  . Pollen Extract    Prior to Admission medications   Medication Sig Start Date End Date Taking? Authorizing Provider  aspirin 325 MG tablet Take 650 mg by mouth every 6 (six) hours as needed for mild pain or headache.   Yes [provider]     Positive ROS: All other systems have been reviewed and were otherwise negative with the exception of those mentioned in the HPI and as above.  Physical Exam: General: Alert, no acute distress Cardiovascular: No pedal edema. Heart is regular and without murmur.  Respiratory: No cyanosis, no use of accessory musculature. Lungs are clear. GI: No organomegaly, abdomen is soft and  non-tender Skin: No lesions in the area of chief complaint Neurologic: Sensation intact distally Psychiatric: Patient is competent for consent with normal mood and affect Lymphatic: No axillary or cervical lymphadenopathy  MUSCULOSKELETAL: Alert and oriented.  Moderate pain. Right leg in traction. Mild shortening and external rotation.  Severe pain with movement. CSM good distally. No other injuries noted. Skin intact.    Assessment: right hip fracture--displaced subcapital.  Plan: Plan for Procedure(s): ARTHROPLASTY UNIPOLAR HIP (HEMIARTHROPLASTY)  The risks benefits and alternatives were discussed with the patient including but not limited to the risks of nonoperative treatment, versus surgical intervention including infection, bleeding, nerve injury,  blood clots, cardiopulmonary complications, morbidity, mortality, among others, and they were  willing to proceed.   Park Breed, MD [phone]   11/08/2017 8:14 PM

## 2017-11-09 ENCOUNTER — Inpatient Hospital Stay: Payer: Medicare Other

## 2017-11-09 ENCOUNTER — Inpatient Hospital Stay: Payer: Medicare Other | Admitting: Anesthesiology

## 2017-11-09 ENCOUNTER — Encounter
Admission: EM | Disposition: A | Discharge status: Skilled Nursing Facility | Payer: Self-pay | Source: Home / Self Care | Attending: Internal Medicine

## 2017-11-09 HISTORY — PX: HIP ARTHROPLASTY: SHX981

## 2017-11-09 LAB — CREATININE, SERUM
Creatinine, Ser: 0.67 mg/dL (ref 0.44–1.00)
GFR calc non Af Amer: >60 mL/min (ref >60)

## 2017-11-09 LAB — CBC
HCT: 36 % (ref 35.0–47.0)
Hemoglobin: 12.1 g/dL (ref 12.0–16.0)
MCH: 29.8 pg (ref 26.0–34.0)
MCHC: 33.5 g/dL (ref 32.0–36.0)
MCV: 88.9 fL (ref 80.0–100.0)
PLATELETS: 181 10*3/uL (ref 150–440)
RBC: 4.05 MIL/uL (ref 3.80–5.20)
RDW: 12.3 % (ref 11.5–14.5)
WBC: 10 10*3/uL (ref 3.6–11.0)

## 2017-11-09 SURGERY — HEMIARTHROPLASTY, HIP, DIRECT ANTERIOR APPROACH, FOR FRACTURE
Anesthesia: Spinal | Site: Hip | Laterality: Right | Wound class: "Clean "

## 2017-11-09 MED ORDER — CLINDAMYCIN PHOSPHATE 600 MG/50ML IV SOLN
INTRAVENOUS | Status: DC | PRN
  Administered 2017-11-09: 600 mg via INTRAVENOUS

## 2017-11-09 MED ORDER — METOCLOPRAMIDE HCL 10 MG PO TABS: 5.0000 mg | ORAL_TABLET | Freq: Three times a day (TID) | ORAL | Status: DC | PRN

## 2017-11-09 MED ORDER — BISACODYL 10 MG RE SUPP
10.0000 mg | Freq: Every day | RECTAL | Status: DC | PRN
  Administered 2017-11-11 – 2017-11-12 (×2): 10 mg via RECTAL
  Filled 2017-11-09 (×2): qty 1

## 2017-11-09 MED ORDER — MELOXICAM 7.5 MG PO TABS
15.0000 mg | ORAL_TABLET | Freq: Once | ORAL | Status: DC
  Filled 2017-11-09: qty 1
  Filled 2017-11-09: qty 2

## 2017-11-09 MED ORDER — EPHEDRINE SULFATE 50 MG/ML IJ SOLN
INTRAMUSCULAR | Status: AC
  Filled 2017-11-09: qty 1

## 2017-11-09 MED ORDER — SODIUM CHLORIDE 0.9 % IJ SOLN
INTRAMUSCULAR | Status: AC
  Filled 2017-11-09: qty 50

## 2017-11-09 MED ORDER — OXYCODONE HCL 5 MG PO TABS: 5.0000 mg | ORAL_TABLET | Freq: Once | ORAL | Status: DC | PRN

## 2017-11-09 MED ORDER — SODIUM CHLORIDE 0.9 % IV SOLN
INTRAVENOUS | Status: DC | PRN
  Administered 2017-11-09: 60 mL

## 2017-11-09 MED ORDER — NEOMYCIN-POLYMYXIN B GU 40-200000 IR SOLN
Status: AC
Start: 2017-11-09 — End: ?
  Filled 2017-11-09: qty 20

## 2017-11-09 MED ORDER — EPHEDRINE SULFATE 50 MG/ML IJ SOLN
INTRAMUSCULAR | Status: DC | PRN
  Administered 2017-11-09: 10 mg via INTRAVENOUS
  Administered 2017-11-09 (×6): 5 mg via INTRAVENOUS

## 2017-11-09 MED ORDER — GABAPENTIN 300 MG PO CAPS
300.0000 mg | ORAL_CAPSULE | Freq: Three times a day (TID) | ORAL | Status: DC
  Administered 2017-11-09 – 2017-11-12 (×8): 300 mg via ORAL
  Filled 2017-11-09 (×8): qty 1

## 2017-11-09 MED ORDER — TRAMADOL HCL 50 MG PO TABS
50.0000 mg | ORAL_TABLET | Freq: Four times a day (QID) | ORAL | Status: DC
  Administered 2017-11-09 – 2017-11-12 (×8): 50 mg via ORAL
  Filled 2017-11-09 (×10): qty 1

## 2017-11-09 MED ORDER — CEFAZOLIN SODIUM-DEXTROSE 2-4 GM/100ML-% IV SOLN
2.0000 g | Freq: Three times a day (TID) | INTRAVENOUS | Status: AC
  Administered 2017-11-09 – 2017-11-10 (×3): 2 g via INTRAVENOUS
  Filled 2017-11-09 (×3): qty 100

## 2017-11-09 MED ORDER — PROMETHAZINE HCL 25 MG/ML IJ SOLN: 6.2500 mg | INTRAMUSCULAR | Status: DC | PRN

## 2017-11-09 MED ORDER — ENOXAPARIN SODIUM 30 MG/0.3ML ~~LOC~~ SOLN
30.0000 mg | SUBCUTANEOUS | Status: DC
  Administered 2017-11-10 – 2017-11-11 (×2): 30 mg via SUBCUTANEOUS
  Filled 2017-11-09 (×2): qty 0.3

## 2017-11-09 MED ORDER — METHOCARBAMOL 1000 MG/10ML IJ SOLN
500.0000 mg | Freq: Four times a day (QID) | INTRAMUSCULAR | Status: DC | PRN
  Filled 2017-11-09: qty 5

## 2017-11-09 MED ORDER — FLEET ENEMA 7-19 GM/118ML RE ENEM
1.0000 | ENEMA | Freq: Once | RECTAL | Status: AC | PRN
  Administered 2017-11-12: 1 via RECTAL

## 2017-11-09 MED ORDER — MENTHOL 3 MG MT LOZG
1.0000 | LOZENGE | OROMUCOSAL | Status: DC | PRN
  Filled 2017-11-09: qty 9

## 2017-11-09 MED ORDER — PROPOFOL 500 MG/50ML IV EMUL
INTRAVENOUS | Status: DC | PRN
  Administered 2017-11-09: 70 ug/kg/min via INTRAVENOUS

## 2017-11-09 MED ORDER — BUPIVACAINE LIPOSOME 1.3 % IJ SUSP
INTRAMUSCULAR | Status: AC
  Filled 2017-11-09: qty 20

## 2017-11-09 MED ORDER — FERROUS SULFATE 325 (65 FE) MG PO TABS
325.0000 mg | ORAL_TABLET | Freq: Every day | ORAL | Status: DC
  Administered 2017-11-10 – 2017-11-12 (×3): 325 mg via ORAL
  Filled 2017-11-09 (×3): qty 1

## 2017-11-09 MED ORDER — SODIUM CHLORIDE 0.45 % IV SOLN
INTRAVENOUS | Status: DC
  Administered 2017-11-09: 12:00:00 via INTRAVENOUS
  Administered 2017-11-10: 75 mL/h via INTRAVENOUS

## 2017-11-09 MED ORDER — OXYCODONE HCL 5 MG/5ML PO SOLN: 5.0000 mg | Freq: Once | ORAL | Status: DC | PRN

## 2017-11-09 MED ORDER — BUPIVACAINE HCL (PF) 0.5 % IJ SOLN
INTRAMUSCULAR | Status: DC | PRN
  Administered 2017-11-09: 3 mL

## 2017-11-09 MED ORDER — ENSURE ENLIVE PO LIQD
237.0000 mL | Freq: Two times a day (BID) | ORAL | Status: DC
  Administered 2017-11-09 – 2017-11-12 (×6): 237 mL via ORAL

## 2017-11-09 MED ORDER — FENTANYL CITRATE (PF) 100 MCG/2ML IJ SOLN: 25.0000 ug | INTRAMUSCULAR | Status: DC | PRN

## 2017-11-09 MED ORDER — ZOLPIDEM TARTRATE 5 MG PO TABS: 5.0000 mg | ORAL_TABLET | Freq: Every evening | ORAL | Status: DC | PRN

## 2017-11-09 MED ORDER — ALUM & MAG HYDROXIDE-SIMETH 200-200-20 MG/5ML PO SUSP: 30.0000 mL | ORAL | Status: DC | PRN

## 2017-11-09 MED ORDER — PHENYLEPHRINE HCL 10 MG/ML IJ SOLN
INTRAMUSCULAR | Status: DC | PRN
  Administered 2017-11-09: 150 ug via INTRAVENOUS
  Administered 2017-11-09 (×3): 100 ug via INTRAVENOUS
  Administered 2017-11-09: 150 ug via INTRAVENOUS
  Administered 2017-11-09: 100 ug via INTRAVENOUS
  Administered 2017-11-09: 150 ug via INTRAVENOUS
  Administered 2017-11-09: 100 ug via INTRAVENOUS

## 2017-11-09 MED ORDER — HYDROCODONE-ACETAMINOPHEN 5-325 MG PO TABS: 1.0000 | ORAL_TABLET | ORAL | Status: DC | PRN

## 2017-11-09 MED ORDER — PHENOL 1.4 % MT LIQD
1.0000 | OROMUCOSAL | Status: DC | PRN
  Filled 2017-11-09: qty 177

## 2017-11-09 MED ORDER — METHOCARBAMOL 500 MG PO TABS
500.0000 mg | ORAL_TABLET | Freq: Four times a day (QID) | ORAL | Status: DC | PRN
  Administered 2017-11-09: 500 mg via ORAL
  Filled 2017-11-09: qty 1

## 2017-11-09 MED ORDER — PROPOFOL 10 MG/ML IV BOLUS
INTRAVENOUS | Status: DC | PRN
  Administered 2017-11-09: 30 mg via INTRAVENOUS
  Administered 2017-11-09 (×2): 15 mg via INTRAVENOUS

## 2017-11-09 MED ORDER — DOCUSATE SODIUM 100 MG PO CAPS
100.0000 mg | ORAL_CAPSULE | Freq: Two times a day (BID) | ORAL | Status: DC
  Administered 2017-11-09 – 2017-11-12 (×6): 100 mg via ORAL
  Filled 2017-11-09 (×7): qty 1

## 2017-11-09 MED ORDER — METOCLOPRAMIDE HCL 5 MG/ML IJ SOLN: 5.0000 mg | Freq: Three times a day (TID) | INTRAMUSCULAR | Status: DC | PRN

## 2017-11-09 MED ORDER — MEPERIDINE HCL 50 MG/ML IJ SOLN: 6.2500 mg | INTRAMUSCULAR | Status: DC | PRN

## 2017-11-09 MED ORDER — CEFAZOLIN SODIUM-DEXTROSE 2-3 GM-%(50ML) IV SOLR
INTRAVENOUS | Status: DC | PRN
  Administered 2017-11-09: 2 g via INTRAVENOUS

## 2017-11-09 MED ORDER — BUPIVACAINE-EPINEPHRINE (PF) 0.25% -1:200000 IJ SOLN
INTRAMUSCULAR | Status: DC | PRN
  Administered 2017-11-09: 30 mL via PERINEURAL

## 2017-11-09 MED ORDER — BUPIVACAINE-EPINEPHRINE (PF) 0.25% -1:200000 IJ SOLN
INTRAMUSCULAR | Status: AC
  Filled 2017-11-09: qty 30

## 2017-11-09 MED ORDER — PROPOFOL 500 MG/50ML IV EMUL
INTRAVENOUS | Status: AC
Start: 2017-11-09 — End: ?
  Filled 2017-11-09: qty 50

## 2017-11-09 MED ORDER — PROPOFOL 10 MG/ML IV BOLUS
INTRAVENOUS | Status: AC
  Filled 2017-11-09: qty 20

## 2017-11-09 MED ORDER — CLINDAMYCIN PHOSPHATE 600 MG/50ML IV SOLN
600.0000 mg | Freq: Three times a day (TID) | INTRAVENOUS | Status: AC
  Administered 2017-11-09 – 2017-11-10 (×3): 600 mg via INTRAVENOUS
  Filled 2017-11-09 (×3): qty 50

## 2017-11-09 SURGICAL SUPPLY — 65 items
BAG COUNTER SPONGE EZ (MISCELLANEOUS) ×2 IMPLANT
BLADE DEBAKEY 8.0 (BLADE) ×2 IMPLANT
BLADE SAGITTAL WIDE XTHICK NO (BLADE) ×2 IMPLANT
BLADE SURG SZ10 CARB STEEL (BLADE) ×2 IMPLANT
CANISTER SUCT 1200ML W/VALVE (MISCELLANEOUS) ×6 IMPLANT
CHLORAPREP W/TINT 26ML (MISCELLANEOUS) ×4 IMPLANT
DRAPE INCISE IOBAN 66X60 STRL (DRAPES) ×4 IMPLANT
DRAPE TABLE BACK 80X90 (DRAPES) ×2 IMPLANT
DRESSING ALLEVYN LIFE SACRUM (GAUZE/BANDAGES/DRESSINGS) ×1 IMPLANT
DRSG AQUACEL AG ADV 3.5X10 (GAUZE/BANDAGES/DRESSINGS) ×2 IMPLANT
DRSG AQUACEL AG ADV 3.5X14 (GAUZE/BANDAGES/DRESSINGS) ×2 IMPLANT
ELECT BLADE 6.5 EXT (BLADE) ×2 IMPLANT
ELECT CAUTERY BLADE 6.4 (BLADE) ×2 IMPLANT
ELECT REM PT RETURN 9FT ADLT (ELECTROSURGICAL) ×2
ELECTRODE REM PT RTRN 9FT ADLT (ELECTROSURGICAL) ×1 IMPLANT
EVACUATOR 1/8 PVC DRAIN (DRAIN) ×2 IMPLANT
GAUZE PETRO XEROFOAM 1X8 (MISCELLANEOUS) ×4 IMPLANT
GAUZE SPONGE 4X4 12PLY STRL (GAUZE/BANDAGES/DRESSINGS) ×2 IMPLANT
GLOVE BIOGEL PI IND STRL 7.0 (GLOVE) IMPLANT
GLOVE BIOGEL PI INDICATOR 7.0 (GLOVE) ×4
GLOVE INDICATOR 8.0 STRL GRN (GLOVE) ×2 IMPLANT
GLOVE SURG ORTHO 8.5 STRL (GLOVE) ×2 IMPLANT
GOWN STRL REUS W/ TWL LRG LVL3 (GOWN DISPOSABLE) ×2 IMPLANT
GOWN STRL REUS W/TWL LRG LVL3 (GOWN DISPOSABLE) ×2
GOWN STRL REUS W/TWL LRG LVL4 (GOWN DISPOSABLE) ×2 IMPLANT
HANDLE YANKAUER SUCT BULB TIP (MISCELLANEOUS) ×2 IMPLANT
HEAD MODULAR ENDO (Orthopedic Implant) ×1 IMPLANT
HEAD UNPLR 46XMDLR STRL HIP (Orthopedic Implant) IMPLANT
HOLDER FOLEY CATH W/STRAP (MISCELLANEOUS) ×1 IMPLANT
IV NS 1000ML (IV SOLUTION) ×1
IV NS 1000ML BAXH (IV SOLUTION) ×1 IMPLANT
KIT DRSG VAC SLVR GRANUFM (MISCELLANEOUS) ×1 IMPLANT
KIT TURNOVER KIT A (KITS) ×2 IMPLANT
MAT ABSORB  FLUID 56X50 GRAY (MISCELLANEOUS) ×1
MAT ABSORB FLUID 56X50 GRAY (MISCELLANEOUS) IMPLANT
NDL FILTER BLUNT 18X1 1/2 (NEEDLE) ×1 IMPLANT
NDL MAYO CATGUT SZ4 TPR NDL (NEEDLE) ×1 IMPLANT
NDL SPNL 18GX3.5 QUINCKE PK (NEEDLE) ×2 IMPLANT
NEEDLE FILTER BLUNT 18X 1/2SAF (NEEDLE) ×1
NEEDLE FILTER BLUNT 18X1 1/2 (NEEDLE) ×1 IMPLANT
NEEDLE MAYO CATGUT SZ4 (NEEDLE) ×2 IMPLANT
NEEDLE SPNL 18GX3.5 QUINCKE PK (NEEDLE) ×4 IMPLANT
NS IRRIG 1000ML POUR BTL (IV SOLUTION) ×2 IMPLANT
PACK HIP PROSTHESIS (MISCELLANEOUS) ×2 IMPLANT
PAD ABD DERMACEA PRESS 5X9 (GAUZE/BANDAGES/DRESSINGS) ×4 IMPLANT
PULSAVAC PLUS IRRIG FAN TIP (DISPOSABLE) ×2
SLEEVE UNITRAX V40 STD (Orthopedic Implant) ×1 IMPLANT
SOL PREP PVP 2OZ (MISCELLANEOUS) ×2
SOLUTION PREP PVP 2OZ (MISCELLANEOUS) ×1 IMPLANT
STAPLER SKIN PROX 35W (STAPLE) ×2 IMPLANT
STEM HIP 127 DEG (Stem) ×1 IMPLANT
SUT DVC 2 QUILL PDO  T11 36X36 (SUTURE) ×2
SUT DVC 2 QUILL PDO T11 36X36 (SUTURE) ×2 IMPLANT
SUT QUILL PDO 0 36 36 VIOLET (SUTURE) ×2 IMPLANT
SUT TICRON 2-0 30IN 311381 (SUTURE) ×10 IMPLANT
SYR 10ML LL (SYRINGE) ×2 IMPLANT
SYR 20CC LL (SYRINGE) ×1 IMPLANT
SYR 30ML LL (SYRINGE) ×2 IMPLANT
SYR 50ML LL SCALE MARK (SYRINGE) ×2 IMPLANT
TAPE MICROFOAM 4IN (TAPE) ×2 IMPLANT
TIP BRUSH PULSAVAC PLUS 24.33 (MISCELLANEOUS) ×2 IMPLANT
TIP FAN IRRIG PULSAVAC PLUS (DISPOSABLE) ×1 IMPLANT
TRAY FOLEY CATH SILVER 16FR LF (SET/KITS/TRAYS/PACK) ×1 IMPLANT
TUBE SUCT KAM VAC (TUBING) ×2 IMPLANT
WATER STERILE IRR 1000ML POUR (IV SOLUTION) ×2 IMPLANT

## 2017-11-09 NOTE — Anesthesia Procedure Notes (Signed)
Spinal  Patient location during procedure: OR Start time: 11/09/2017 8:10 AM Staffing Anesthesiologist: Emmie Niemann, MD Resident/CRNA: Sherrine Maples, CRNA Performed: resident/CRNA and anesthesiologist  Preanesthetic Checklist Completed: patient identified, site marked, surgical consent, pre-op evaluation, timeout performed, IV checked, risks and benefits discussed and monitors and equipment checked Spinal Block Patient position: sitting Prep: ChloraPrep Patient monitoring: heart rate, continuous pulse ox, blood pressure and cardiac monitor Approach: midline Location: L4-5 Injection technique: single-shot Needle Needle type: Introducer and Pencil-Tip  Needle gauge: 24 G Needle length: 9 cm Additional Notes Negative paresthesia. Negative blood return. Positive free-flowing CSF. Expiration date of kit checked and confirmed. Patient tolerated procedure well, without complications.

## 2017-11-09 NOTE — Transfer of Care (Signed)
Immediate Anesthesia Transfer of Care Note  Patient: Elizabeth Fernandez  Procedure(s) Performed: ARTHROPLASTY BIPOLAR HIP (HEMIARTHROPLASTY) (Right Hip)  Patient Location: PACU  Anesthesia Type:General  Level of Consciousness: awake and alert   Airway & Oxygen Therapy: Patient Spontanous Breathing and Patient connected to nasal cannula oxygen  Post-op Assessment: Report given to RN and Post -op Vital signs reviewed and stable   Post vital signs: Reviewed and stable  Last Vitals:  Vitals Value Taken Time  BP 115/58 11/09/2017 10:23 AM  Temp 37.6 C 11/09/2017 10:23 AM  Pulse 108 11/09/2017 10:29 AM  Resp 17 11/09/2017 10:29 AM  SpO2 100 % 11/09/2017 10:29 AM  Vitals shown include unvalidated device data.  Last Pain:  Vitals:   11/09/17 1023  TempSrc: Skin  PainSc:          Complications: No apparent anesthesia complications

## 2017-11-09 NOTE — H&P (Signed)
THE PATIENT WAS SEEN PRIOR TO SURGERY TODAY.  HISTORY, ALLERGIES, HOME MEDICATIONS AND OPERATIVE PROCEDURE WERE REVIEWED. RISKS AND BENEFITS OF SURGERY DISCUSSED WITH PATIENT AGAIN.  NO CHANGES FROM INITIAL HISTORY AND PHYSICAL NOTED.    

## 2017-11-09 NOTE — Op Note (Signed)
11/09/2017  10:21 AM  PATIENT:  Elizabeth Fernandez   MRN: [id number]  PRE-OPERATIVE DIAGNOSIS:  Displaced Subcapital fracture right hip   POST-OPERATIVE DIAGNOSIS: Same  PROCEDURE: Right   hip hemiarthroplasty with Stryker Accolade prosthesis  PREOPERATIVE INDICATIONS:  Elizabeth Fernandez is an 84 y.o. female who was admitted 11/07/2017 with a diagnosis of displaced subcapital fracture of the hip and elected for surgical management.  The risks benefits and alternatives were discussed with the patient including but not limited to the risks of nonoperative treatment, versus surgical intervention including infection, bleeding, nerve injury, periprosthetic fracture, the need for revision surgery, dislocation, leg length discrepancy, blood clots, cardiopulmonary complications, morbidity, mortality, among others, and they were willing to proceed.  Predicted outcome is good, although there will be at least a six to nine month expected recovery.     SURGEON:  Earnestine Leys, MD  ASST:    ANESTHESIA: Spinal    COMPLICATIONS:  None.   EBL: 100 cc    COMPONENTS:  Stryker Accolade Femoral Fracture stem size #5,   and a size   46 mm  fracture head unipolar hip ball with    standard mm  neck length.    PROCEDURE IN DETAIL: The patient was met in the holding area and identified.  The appropriate hip  was marked at the operative site. The patient was then transported to the OR and  placed under general anesthesia.  At that point, the patient was  placed in the lateral decubitus position with the operative side up and  secured to the operating room table and all bony prominences padded.     The operative lower extremity was prepped from the iliac crest to the toes.  Sterile draping was performed.  Time out was performed prior to incision.      A routine posterolateral approach was utilized via sharp dissection  carried down to the subcutaneous tissue.  Gross bleeders were Bovie  coagulated.  The iliotibial band was  identified and incised  along the length of the skin incision.  Self-retaining retractors were  inserted.  With the hip internally rotated, the short external rotators  were identified. The piriformis was tagged and the hip capsule released in a T-type fashion.  The femoral neck was exposed, and I resected the femoral neck using the appropriate jig. This was performed at approximately a thumb's breadth above the lesser trochanter.    I then exposed the deep acetabulum, cleared out any tissue including the ligamentum teres.    I then prepared the proximal femur using the cookie-cutter, the lateralizing reamer, and then sequentially broached.  A trial stem   was  utilized along with a unipolar head and neck.  I reduced the hip and it was found to have excellent stability with functional range of motion. Leg lengths were equal.  The trial components were then removed.   The same size Accolade femoral stem was then inserted and was very stable.  The Unitrax head and neck as trialed were inserted as well.     The hip was then reduced and taken through functional range of motion and found to have excellent stability. Leg lengths were restored.  Exparel was injected.    I closed the T in the capsule with #2 Ticron as well as the short external rotators.   I then irrigated the hip copiously again with pulse lavage, and repaired the fascia with #2 Quill and the subcutaneous layer with #0 Quill. Sponge and needle  counts were correct. Dry sterile Aquacell was applied.   The patient was then awakened and returned to PACU in stable and satisfactory condition. There were no complications.  Park Breed, MD Orthopedic Surgeon [phone]   11/09/2017 10:21 AM

## 2017-11-09 NOTE — Anesthesia Preprocedure Evaluation (Addendum)
Anesthesia Evaluation  Patient identified by MRN, date of birth, ID band Patient awake    Reviewed: Allergy & Precautions, NPO status , Patient's Chart, lab work & pertinent test results  History of Anesthesia Complications Negative for: history of anesthetic complications  Airway Mallampati: III  TM Distance: <3 FB Neck ROM: Full  Mouth opening: Limited Mouth Opening  Dental  (+) Missing, Poor Dentition   Pulmonary neg pulmonary ROS, neg sleep apnea, neg COPD,    breath sounds clear to auscultation- rhonchi (-) wheezing      Cardiovascular (-) hypertension+ DVT (remote, not currently on anticoagulation)  (-) CAD, (-) Past MI, (-) Cardiac Stents and (-) CABG  Rhythm:Regular Rate:Normal - Systolic murmurs and - Diastolic murmurs    Neuro/Psych Anxiety negative neurological ROS     GI/Hepatic negative GI ROS, Neg liver ROS,   Endo/Other  negative endocrine ROSneg diabetes  Renal/GU negative Renal ROS     Musculoskeletal  (+) Arthritis ,   Abdominal (+) - obese,   Peds  Hematology negative hematology ROS (+)   Anesthesia Other Findings Past Medical History: No date: Arthritis     Comment:  both knees No date: Cataract     Comment:  Surgery scheduled for 03/2014   Reproductive/Obstetrics                             Lab Results  Component Value Date   WBC 8.3 11/08/2017   HGB 12.0 11/08/2017   HCT 35.7 11/08/2017   MCV 89.3 11/08/2017   PLT 202 11/08/2017    Anesthesia Physical Anesthesia Plan  ASA: II  Anesthesia Plan: Spinal   Post-op Pain Management:    Induction:   PONV Risk Score and Plan: 2 and Propofol infusion  Airway Management Planned: Natural Airway  Additional Equipment:   Intra-op Plan:   Post-operative Plan:   Informed Consent: I have reviewed the patients History and Physical, chart, labs and discussed the procedure including the risks, benefits and  alternatives for the proposed anesthesia with the patient or authorized representative who has indicated his/her understanding and acceptance.   Dental advisory given  Plan Discussed with: CRNA and Anesthesiologist  Anesthesia Plan Comments:         Anesthesia Quick Evaluation

## 2017-11-09 NOTE — Anesthesia Post-op Follow-up Note (Signed)
Anesthesia QCDR form completed.        

## 2017-11-09 NOTE — Progress Notes (Signed)
15 minute call to floor. 

## 2017-11-09 NOTE — Progress Notes (Signed)
[place] at [facility]                                                                                                                                                                                  Patient Demographics   Elizabeth Fernandez, is a 84 y.o. female, DOB - 04-24-1934, VOJ:[id number]  Admit date - 11/07/2017   Admitting Physician Lance Coon, MD  Outpatient Primary MD for the patient is Arnett, Yvetta Coder, FNP   LOS - 1  Subjective:  She had her surgery earlier she is currently denying any symptoms   Review of Systems:   CONSTITUTIONAL: No documented fever. No fatigue, weakness. No weight gain, no weight loss.  EYES: No blurry or double vision.  ENT: No tinnitus. No postnasal drip. No redness of the oropharynx.  RESPIRATORY: No cough, no wheeze, no hemoptysis. No dyspnea.  CARDIOVASCULAR: No chest pain. No orthopnea. No palpitations. No syncope.  GASTROINTESTINAL: No nausea, no vomiting or diarrhea. No abdominal pain. No melena or hematochezia.  GENITOURINARY: No dysuria or hematuria.  ENDOCRINE: No polyuria or nocturia. No heat or cold intolerance.  HEMATOLOGY: No anemia. No bruising. No bleeding.  INTEGUMENTARY: No rashes. No lesions.  MUSCULOSKELETAL: No arthritis. No swelling. No gout.  NEUROLOGIC: No numbness, tingling, or ataxia. No seizure-type activity.  PSYCHIATRIC: No anxiety. No insomnia. No ADD.    Vitals:   Vitals:   11/09/17 1037 11/09/17 1052 11/09/17 1107 11/09/17 1200  BP: (!) 124/54 126/69 134/68 (!) 124/59  Pulse: (!) 111 (!) 111 (!) 110 (!) 105  Resp: 12 (!) 21 11 18   Temp:    (!) 97.4 F (36.3 C)  TempSrc:    Oral  SpO2: 95% 97% 95% 96%  Weight:      Height:        Wt Readings from Last 3 Encounters:  11/08/17 139 lb 9.6 oz (63.3 kg)  03/26/17 149 lb (67.6 kg)  02/25/17 145 lb 3.2 oz (65.9 kg)     Intake/Output Summary (Last 24 hours) at 11/09/2017 1307 Last data filed at 11/09/2017 1249 Gross per 24 hour   Intake 1153.75 ml  Output 1410 ml  Net -256.25 ml    Physical Exam:   GENERAL: Pleasant-appearing in no apparent distress.  HEAD, EYES, EARS, NOSE AND THROAT: Atraumatic, normocephalic. Extraocular muscles are intact. Pupils equal and reactive to light. Sclerae anicteric. No conjunctival injection. No oro-pharyngeal erythema.  NECK: Supple. There is no jugular venous distention. No bruits, no lymphadenopathy, no thyromegaly.  HEART: Regular rate and rhythm,. No murmurs, no rubs, no clicks.  LUNGS: Clear to auscultation bilaterally. No rales or rhonchi. No wheezes.  ABDOMEN: Soft, flat,  nontender, nondistended. Has good bowel sounds. No hepatosplenomegaly appreciated.  EXTREMITIES: No evidence of any cyanosis, clubbing, or peripheral edema.  +2 pedal and radial pulses bilaterally.  NEUROLOGIC: The patient is alert, awake, and oriented x3 with no focal motor or sensory deficits appreciated bilaterally.  SKIN: Moist and warm with no rashes appreciated.  Psych: Not anxious, depressed LN: No inguinal LN enlargement    Antibiotics   Anti-infectives (From admission, onward)   Start     Dose/Rate Route Frequency Ordered Stop   11/09/17 1400  ceFAZolin (ANCEF) IVPB 2g/100 mL premix     2 g 200 mL/hr over 30 Minutes Intravenous Every 8 hours 11/09/17 1129 11/10/17 1359   11/09/17 1400  clindamycin (CLEOCIN) IVPB 600 mg     600 mg 100 mL/hr over 30 Minutes Intravenous Every 8 hours 11/09/17 1129 11/10/17 1359   11/08/17 0600  ceFAZolin (ANCEF) IVPB 2g/100 mL premix     2 g 200 mL/hr over 30 Minutes Intravenous On call to O.R. 11/08/17 0008 11/09/17 0559   11/08/17 0600  clindamycin (CLEOCIN) IVPB 600 mg     600 mg 100 mL/hr over 30 Minutes Intravenous On call to O.R. 11/08/17 0008 11/09/17 0559      Medications   Scheduled Meds: . docusate sodium  100 mg Oral BID  . [START ON 11/10/2017] enoxaparin (LOVENOX) injection  30 mg Subcutaneous Q24H  . [START ON 11/10/2017] ferrous sulfate   325 mg Oral Q breakfast  . gabapentin  300 mg Oral TID  . meloxicam  15 mg Oral Once  . traMADol  50 mg Oral Q6H   Continuous Infusions: . sodium chloride 75 mL/hr at 11/09/17 1206  . sodium chloride 75 mL/hr (11/09/17 0621)  .  ceFAZolin (ANCEF) IV    . clindamycin (CLEOCIN) IV    . methocarbamol (ROBAXIN)  IV     PRN Meds:.acetaminophen **OR** acetaminophen, alum & mag hydroxide-simeth, bisacodyl, HYDROcodone-acetaminophen, menthol-cetylpyridinium **OR** phenol, methocarbamol **OR** methocarbamol (ROBAXIN)  IV, metoCLOPramide **OR** metoCLOPramide (REGLAN) injection, morphine injection, ondansetron **OR** ondansetron (ZOFRAN) IV, oxyCODONE, sodium phosphate, zolpidem   Data Review:   Micro Results Recent Results (from the past 240 hour(s))  Surgical pcr screen     Status: None   Collection Time: 11/08/17  1:52 AM  Result Value Ref Range Status   MRSA, PCR NEGATIVE NEGATIVE Final   Staphylococcus aureus NEGATIVE NEGATIVE Final    Comment: (NOTE) The Xpert SA Assay (FDA approved for NASAL specimens in patients 65 years of age and older), is one component of a comprehensive surveillance program. It is not intended to diagnose infection nor to guide or monitor treatment. Performed at [facility], [address] [place], [place] [address]     Radiology Reports Dg Chest 1 View  Result Date: 11/08/2017 CLINICAL DATA:  Slip and fall injury with right hip pain. EXAM: CHEST  1 VIEW COMPARISON:  11/10/2014 FINDINGS: Shallow inspiration. Cardiac enlargement. No pulmonary vascular congestion. No airspace disease or consolidation in the lungs. No blunting of costophrenic angles. No pneumothorax. Calcification of the aorta. Degenerative changes in the spine. Old left rib fractures. Surgical clips in the right upper quadrant. IMPRESSION: No evidence of active pulmonary disease.  Aortic atherosclerosis. Electronically Signed   By: Lucienne Capers M.D.   On: 11/08/2017 00:13   Dg  Hip Port Unilat With Pelvis 1v Right  Result Date: 11/09/2017 CLINICAL DATA:  Status post hip replacement EXAM: DG HIP (WITH OR WITHOUT PELVIS) 1V PORT RIGHT COMPARISON:  None.  FINDINGS: The patient is status post right hip replacement. Acetabular and femoral components are in good position. A gamma nail and rod associated with the left proximal femur are unchanged since Dec 07, 2017. IMPRESSION: Status post right hip replacement as above. Electronically Signed   By: Dorise Bullion III M.D   On: 11/09/2017 11:33   Dg Hip Unilat W Or Wo Pelvis 2-3 Views Right  Result Date: 11/08/2017 CLINICAL DATA:  Fall EXAM: DG HIP (WITH OR WITHOUT PELVIS) 2-3V RIGHT COMPARISON:  None. FINDINGS: There is a superiorly displaced fracture of the right femoral neck. No other pelvic fracture is identified. The femoral head remains situated in the acetabulum. Left femoral hardware where is normal. IMPRESSION: Superiorly displaced transverse fracture of the right femoral neck. Electronically Signed   By: Ulyses Jarred M.D.   On: 11/08/2017 00:12     CBC Recent Labs  Lab 11/08/17 0009 11/09/17 1223  WBC 8.3 10.0  HGB 12.0 12.1  HCT 35.7 36.0  PLT 202 181  MCV 89.3 88.9  MCH 30.1 29.8  MCHC 33.7 33.5  RDW 12.8 12.3    Chemistries  Recent Labs  Lab 11/08/17 0009 11/09/17 1223  NA 144  --   K 4.1  --   CL 108  --   CO2 30  --   GLUCOSE 117*  --   BUN 22*  --   CREATININE 0.81 0.67  CALCIUM 8.6*  --   AST 21  --   ALT 17  --   ALKPHOS 95  --   BILITOT 0.6  --    ------------------------------------------------------------------------------------------------------------------ estimated creatinine clearance is 48.8 mL/min (by C-G formula based on SCr of 0.67 mg/dL). ------------------------------------------------------------------------------------------------------------------ No results for input(s): HGBA1C in the last 72  hours. ------------------------------------------------------------------------------------------------------------------ No results for input(s): CHOL, HDL, LDLCALC, TRIG, CHOLHDL, LDLDIRECT in the last 72 hours. ------------------------------------------------------------------------------------------------------------------ No results for input(s): TSH, T4TOTAL, T3FREE, THYROIDAB in the last 72 hours.  Invalid input(s): FREET3 ------------------------------------------------------------------------------------------------------------------ No results for input(s): VITAMINB12, FOLATE, FERRITIN, TIBC, IRON, RETICCTPCT in the last 72 hours.  Coagulation profile Recent Labs  Lab 11/08/17 0009  INR 0.99    No results for input(s): DDIMER in the last 72 hours.  Cardiac Enzymes No results for input(s): CKMB, TROPONINI, MYOGLOBIN in the last 168 hours.  Invalid input(s): CK ------------------------------------------------------------------------------------------------------------------ Invalid input(s): [place]   Patient is 84 year old with closed right hip fracture  1.  Closed right hip fracture status post repair  2.  Previous history of DVT recommend postop prophylaxis  3.  Elevated heart rate continue to monitor likely related to pain  4.  Miscellaneous Lovenox for DVT prophylaxis      Code Status Orders  (From admission, onward)        Start     Ordered   11/08/17 0133  Full code  Continuous     11/08/17 0132    Code Status History    Date Active Date Inactive Code Status Order ID Comments User Context   02/19/2017 0413 02/20/2017 1453 Full Code [id number]  Harrie Foreman, MD ED   01/21/2017 1359 01/25/2017 2018 Full Code [id number]  Vaughan Basta, MD Inpatient    Advance Directive Documentation     Most Recent Value  Type of Advance Directive  Living will  Pre-existing out of facility DNR order (yellow form or pink MOST form)  -   "MOST" Form in Place?  -  Consults orthopedic  DVT Prophylaxis  Lovenox  Lab Results  Component Value Date   PLT 181 11/09/2017     Time Spent in minutes 35 minutes greater than 50% of time spent in care coordination and counseling patient regarding the condition and plan of care.   Dustin Flock M.D on 11/09/2017 at 1:07 PM  Between 7am to 6pm - Pager - [phone]  After 6pm go to [email or web address] - Proofreader  [employer]   Office  [phone]

## 2017-11-10 LAB — BASIC METABOLIC PANEL
Anion gap: 5 (ref 5–15)
BUN: 15 mg/dL (ref 6–20)
CALCIUM: 7.6 mg/dL — AB (ref 8.9–10.3)
CHLORIDE: 103 mmol/L (ref 101–111)
CO2: 25 mmol/L (ref 22–32)
CREATININE: 0.73 mg/dL (ref 0.44–1.00)
GFR calc non Af Amer: >60 mL/min (ref >60)
Glucose, Bld: 129 mg/dL — ABNORMAL HIGH (ref 65–99)
Potassium: 3.7 mmol/L (ref 3.5–5.1)
SODIUM: 133 mmol/L — AB (ref 135–145)

## 2017-11-10 LAB — CBC
HCT: 30.9 % — ABNORMAL LOW (ref 35.0–47.0)
Hemoglobin: 10.3 g/dL — ABNORMAL LOW (ref 12.0–16.0)
MCH: 29.7 pg (ref 26.0–34.0)
MCHC: 33.2 g/dL (ref 32.0–36.0)
MCV: 89.3 fL (ref 80.0–100.0)
PLATELETS: 150 10*3/uL (ref 150–440)
RBC: 3.47 MIL/uL — ABNORMAL LOW (ref 3.80–5.20)
RDW: 12.3 % (ref 11.5–14.5)
WBC: 9.1 10*3/uL (ref 3.6–11.0)

## 2017-11-10 NOTE — Progress Notes (Signed)
Subjective:  Patient reports pain as mild.  Resting comfortably.  Objective:   VITALS:   Vitals:   11/09/17 1526 11/09/17 2335 11/10/17 0449 11/10/17 0735  BP: 104/64 (!) 141/71 127/73 (!) 132/57  Pulse: (!) 105 (!) 105 (!) 105 (!) 103  Resp: 16 18 16 18   Temp: 98.6 F (37 C) 98.4 F (36.9 C) 98 F (36.7 C) 99.2 F (37.3 C)  TempSrc: Oral Oral Oral Oral  SpO2: 96% 96% 97% 96%  Weight:      Height:        PHYSICAL EXAM:  Neurovascular intact Dorsiflexion/Plantar flexion intact Incision: dressing C/D/I Compartment soft  LABS  Results for orders placed or performed during the hospital encounter of 11/07/17 (from the past 24 hour(s))  CBC     Status: None   Collection Time: 11/09/17 12:23 PM  Result Value Ref Range   WBC 10.0 3.6 - 11.0 K/uL   RBC 4.05 3.80 - 5.20 MIL/uL   Hemoglobin 12.1 12.0 - 16.0 g/dL   HCT 36.0 35.0 - 47.0 %   MCV 88.9 80.0 - 100.0 fL   MCH 29.8 26.0 - 34.0 pg   MCHC 33.5 32.0 - 36.0 g/dL   RDW 12.3 11.5 - 14.5 %   Platelets 181 150 - 440 K/uL  Creatinine, serum     Status: None   Collection Time: 11/09/17 12:23 PM  Result Value Ref Range   Creatinine, Ser 0.67 0.44 - 1.00 mg/dL   GFR calc non Af Amer >60 >60 mL/min   GFR calc Af Amer >60 >60 mL/min  CBC     Status: Abnormal   Collection Time: 11/10/17  4:10 AM  Result Value Ref Range   WBC 9.1 3.6 - 11.0 K/uL   RBC 3.47 (L) 3.80 - 5.20 MIL/uL   Hemoglobin 10.3 (L) 12.0 - 16.0 g/dL   HCT 30.9 (L) 35.0 - 47.0 %   MCV 89.3 80.0 - 100.0 fL   MCH 29.7 26.0 - 34.0 pg   MCHC 33.2 32.0 - 36.0 g/dL   RDW 12.3 11.5 - 14.5 %   Platelets 150 150 - 440 K/uL  Basic metabolic panel     Status: Abnormal   Collection Time: 11/10/17  4:10 AM  Result Value Ref Range   Sodium 133 (L) 135 - 145 mmol/L   Potassium 3.7 3.5 - 5.1 mmol/L   Chloride 103 101 - 111 mmol/L   CO2 25 22 - 32 mmol/L   Glucose, Bld 129 (H) 65 - 99 mg/dL   BUN 15 6 - 20 mg/dL   Creatinine, Ser 0.73 0.44 - 1.00 mg/dL   Calcium 7.6 (L) 8.9 - 10.3 mg/dL   GFR calc non Af Amer >60 >60 mL/min   GFR calc Af Amer >60 >60 mL/min   Anion gap 5 5 - 15    Dg Hip Port Unilat With Pelvis 1v Right  Result Date: 11/09/2017 CLINICAL DATA:  Status post hip replacement EXAM: DG HIP (WITH OR WITHOUT PELVIS) 1V PORT RIGHT COMPARISON:  None. FINDINGS: The patient is status post right hip replacement. Acetabular and femoral components are in good position. A gamma nail and rod associated with the left proximal femur are unchanged since Dec 07, 2017. IMPRESSION: Status post right hip replacement as above. Electronically Signed   By: Dorise Bullion III M.D   On: 11/09/2017 11:33    Assessment/Plan: 1 Day Post-Op   Principal Problem:   Closed right hip fracture ([place]) Active Problems:  History of DVT (deep vein thrombosis)   Advance diet Up with therapy D/C IV fluids Discharge to SNF per primary team   Lovell Sheehan , MD 11/10/2017, 11:47 AM

## 2017-11-10 NOTE — Progress Notes (Signed)
[place] at [facility]                                                                                                                                                                                  Patient Demographics   Elizabeth Fernandez, is a 84 y.o. female, DOB - 05-30-1934, QVZ:[id number]  Admit date - 11/07/2017   Admitting Physician Lance Coon, MD  Outpatient Primary MD for the patient is Arnett, Yvetta Coder, FNP   LOS - 2  Subjective: Continues to complain of pain post procedure   Review of Systems:   CONSTITUTIONAL: No documented fever. No fatigue, weakness. No weight gain, no weight loss.  EYES: No blurry or double vision.  ENT: No tinnitus. No postnasal drip. No redness of the oropharynx.  RESPIRATORY: No cough, no wheeze, no hemoptysis. No dyspnea.  CARDIOVASCULAR: No chest pain. No orthopnea. No palpitations. No syncope.  GASTROINTESTINAL: No nausea, no vomiting or diarrhea. No abdominal pain. No melena or hematochezia.  GENITOURINARY: No dysuria or hematuria.  ENDOCRINE: No polyuria or nocturia. No heat or cold intolerance.  HEMATOLOGY: No anemia. No bruising. No bleeding.  INTEGUMENTARY: No rashes. No lesions.  MUSCULOSKELETAL: No arthritis. No swelling. No gout.  NEUROLOGIC: No numbness, tingling, or ataxia. No seizure-type activity.  PSYCHIATRIC: No anxiety. No insomnia. No ADD.    Vitals:   Vitals:   11/09/17 1526 11/09/17 2335 11/10/17 0449 11/10/17 0735  BP: 104/64 (!) 141/71 127/73 (!) 132/57  Pulse: (!) 105 (!) 105 (!) 105 (!) 103  Resp: 16 18 16 18   Temp: 98.6 F (37 C) 98.4 F (36.9 C) 98 F (36.7 C) 99.2 F (37.3 C)  TempSrc: Oral Oral Oral Oral  SpO2: 96% 96% 97% 96%  Weight:      Height:        Wt Readings from Last 3 Encounters:  11/08/17 139 lb 9.6 oz (63.3 kg)  03/26/17 149 lb (67.6 kg)  02/25/17 145 lb 3.2 oz (65.9 kg)     Intake/Output Summary (Last 24 hours) at 11/10/2017 1227 Last data filed at 11/10/2017  3295 Gross per 24 hour  Intake 2095 ml  Output 750 ml  Net 1345 ml    Physical Exam:   GENERAL: Pleasant-appearing in no apparent distress.  HEAD, EYES, EARS, NOSE AND THROAT: Atraumatic, normocephalic. Extraocular muscles are intact. Pupils equal and reactive to light. Sclerae anicteric. No conjunctival injection. No oro-pharyngeal erythema.  NECK: Supple. There is no jugular venous distention. No bruits, no lymphadenopathy, no thyromegaly.  HEART: Regular rate and rhythm,. No murmurs, no rubs, no clicks.  LUNGS: Clear to auscultation bilaterally. No rales or rhonchi. No wheezes.  ABDOMEN:  Soft, flat, nontender, nondistended. Has good bowel sounds. No hepatosplenomegaly appreciated.  EXTREMITIES: No evidence of any cyanosis, clubbing, or peripheral edema.  +2 pedal and radial pulses bilaterally.  NEUROLOGIC: The patient is alert, awake, and oriented x3 with no focal motor or sensory deficits appreciated bilaterally.  SKIN: Moist and warm with no rashes appreciated.  Psych: Not anxious, depressed LN: No inguinal LN enlargement    Antibiotics   Anti-infectives (From admission, onward)   Start     Dose/Rate Route Frequency Ordered Stop   11/09/17 1400  ceFAZolin (ANCEF) IVPB 2g/100 mL premix     2 g 200 mL/hr over 30 Minutes Intravenous Every 8 hours 11/09/17 1129 11/10/17 0550   11/09/17 1400  clindamycin (CLEOCIN) IVPB 600 mg     600 mg 100 mL/hr over 30 Minutes Intravenous Every 8 hours 11/09/17 1129 11/10/17 0634   11/08/17 0600  ceFAZolin (ANCEF) IVPB 2g/100 mL premix     2 g 200 mL/hr over 30 Minutes Intravenous On call to O.R. 11/08/17 0008 11/09/17 0559   11/08/17 0600  clindamycin (CLEOCIN) IVPB 600 mg     600 mg 100 mL/hr over 30 Minutes Intravenous On call to O.R. 11/08/17 0008 11/09/17 0559      Medications   Scheduled Meds: . docusate sodium  100 mg Oral BID  . enoxaparin (LOVENOX) injection  30 mg Subcutaneous Q24H  . feeding supplement (ENSURE ENLIVE)  237  mL Oral BID BM  . ferrous sulfate  325 mg Oral Q breakfast  . gabapentin  300 mg Oral TID  . meloxicam  15 mg Oral Once  . traMADol  50 mg Oral Q6H   Continuous Infusions: . methocarbamol (ROBAXIN)  IV     PRN Meds:.acetaminophen **OR** acetaminophen, alum & mag hydroxide-simeth, bisacodyl, HYDROcodone-acetaminophen, menthol-cetylpyridinium **OR** phenol, methocarbamol **OR** methocarbamol (ROBAXIN)  IV, metoCLOPramide **OR** metoCLOPramide (REGLAN) injection, morphine injection, ondansetron **OR** ondansetron (ZOFRAN) IV, oxyCODONE, sodium phosphate, zolpidem   Data Review:   Micro Results Recent Results (from the past 240 hour(s))  Surgical pcr screen     Status: None   Collection Time: 11/08/17  1:52 AM  Result Value Ref Range Status   MRSA, PCR NEGATIVE NEGATIVE Final   Staphylococcus aureus NEGATIVE NEGATIVE Final    Comment: (NOTE) The Xpert SA Assay (FDA approved for NASAL specimens in patients 72 years of age and older), is one component of a comprehensive surveillance program. It is not intended to diagnose infection nor to guide or monitor treatment. Performed at [facility], [address] [place], [place] [address]     Radiology Reports Dg Chest 1 View  Result Date: 11/08/2017 CLINICAL DATA:  Slip and fall injury with right hip pain. EXAM: CHEST  1 VIEW COMPARISON:  11/10/2014 FINDINGS: Shallow inspiration. Cardiac enlargement. No pulmonary vascular congestion. No airspace disease or consolidation in the lungs. No blunting of costophrenic angles. No pneumothorax. Calcification of the aorta. Degenerative changes in the spine. Old left rib fractures. Surgical clips in the right upper quadrant. IMPRESSION: No evidence of active pulmonary disease.  Aortic atherosclerosis. Electronically Signed   By: Lucienne Capers M.D.   On: 11/08/2017 00:13   Dg Hip Port Unilat With Pelvis 1v Right  Result Date: 11/09/2017 CLINICAL DATA:  Status post hip replacement EXAM: DG  HIP (WITH OR WITHOUT PELVIS) 1V PORT RIGHT COMPARISON:  None. FINDINGS: The patient is status post right hip replacement. Acetabular and femoral components are in good position. A gamma nail and rod associated with  the left proximal femur are unchanged since Dec 07, 2017. IMPRESSION: Status post right hip replacement as above. Electronically Signed   By: Dorise Bullion III M.D   On: 11/09/2017 11:33   Dg Hip Unilat W Or Wo Pelvis 2-3 Views Right  Result Date: 11/08/2017 CLINICAL DATA:  Fall EXAM: DG HIP (WITH OR WITHOUT PELVIS) 2-3V RIGHT COMPARISON:  None. FINDINGS: There is a superiorly displaced fracture of the right femoral neck. No other pelvic fracture is identified. The femoral head remains situated in the acetabulum. Left femoral hardware where is normal. IMPRESSION: Superiorly displaced transverse fracture of the right femoral neck. Electronically Signed   By: Ulyses Jarred M.D.   On: 11/08/2017 00:12     CBC Recent Labs  Lab 11/08/17 0009 11/09/17 1223 11/10/17 0410  WBC 8.3 10.0 9.1  HGB 12.0 12.1 10.3*  HCT 35.7 36.0 30.9*  PLT 202 181 150  MCV 89.3 88.9 89.3  MCH 30.1 29.8 29.7  MCHC 33.7 33.5 33.2  RDW 12.8 12.3 12.3    Chemistries  Recent Labs  Lab 11/08/17 0009 11/09/17 1223 11/10/17 0410  NA 144  --  133*  K 4.1  --  3.7  CL 108  --  103  CO2 30  --  25  GLUCOSE 117*  --  129*  BUN 22*  --  15  CREATININE 0.81 0.67 0.73  CALCIUM 8.6*  --  7.6*  AST 21  --   --   ALT 17  --   --   ALKPHOS 95  --   --   BILITOT 0.6  --   --    ------------------------------------------------------------------------------------------------------------------ estimated creatinine clearance is 48.8 mL/min (by C-G formula based on SCr of 0.73 mg/dL). ------------------------------------------------------------------------------------------------------------------ No results for input(s): HGBA1C in the last 72  hours. ------------------------------------------------------------------------------------------------------------------ No results for input(s): CHOL, HDL, LDLCALC, TRIG, CHOLHDL, LDLDIRECT in the last 72 hours. ------------------------------------------------------------------------------------------------------------------ No results for input(s): TSH, T4TOTAL, T3FREE, THYROIDAB in the last 72 hours.  Invalid input(s): FREET3 ------------------------------------------------------------------------------------------------------------------ No results for input(s): VITAMINB12, FOLATE, FERRITIN, TIBC, IRON, RETICCTPCT in the last 72 hours.  Coagulation profile Recent Labs  Lab 11/08/17 0009  INR 0.99    No results for input(s): DDIMER in the last 72 hours.  Cardiac Enzymes No results for input(s): CKMB, TROPONINI, MYOGLOBIN in the last 168 hours.  Invalid input(s): CK ------------------------------------------------------------------------------------------------------------------ Invalid input(s): [place]   Patient is 84 year old with closed right hip fracture  1.  Closed right hip fracture status post repair  2.  Previous history of DVT recommend postop prophylaxis  3.  Elevated heart rate continue to monitor likely related to pain  4.  Miscellaneous Lovenox for DVT prophylaxis      Code Status Orders  (From admission, onward)        Start     Ordered   11/08/17 0133  Full code  Continuous     11/08/17 0132    Code Status History    Date Active Date Inactive Code Status Order ID Comments User Context   02/19/2017 0413 02/20/2017 1453 Full Code [id number]  Harrie Foreman, MD ED   01/21/2017 1359 01/25/2017 2018 Full Code [id number]  Vaughan Basta, MD Inpatient    Advance Directive Documentation     Most Recent Value  Type of Advance Directive  Living will  Pre-existing out of facility DNR order (yellow form or pink MOST form)  -   "MOST" Form in Place?  -  Consults orthopedic  DVT Prophylaxis  Lovenox  Lab Results  Component Value Date   PLT 150 11/10/2017     Time Spent in minutes 35 minutes greater than 50% of time spent in care coordination and counseling patient regarding the condition and plan of care.   Dustin Flock M.D on 11/10/2017 at 12:27 PM  Between 7am to 6pm - Pager - [phone]  After 6pm go to [email or web address] - Proofreader  [employer]   Office  [phone]

## 2017-11-10 NOTE — Anesthesia Postprocedure Evaluation (Signed)
Anesthesia Post Note  Patient: Elizabeth Fernandez  Procedure(s) Performed: ARTHROPLASTY BIPOLAR HIP (HEMIARTHROPLASTY) (Right Hip)  Patient location during evaluation: Nursing Unit Anesthesia Type: Spinal Level of consciousness: oriented and awake and alert Pain management: pain level controlled Vital Signs Assessment: post-procedure vital signs reviewed and stable Respiratory status: spontaneous breathing, respiratory function stable and patient connected to nasal cannula oxygen Cardiovascular status: blood pressure returned to baseline and stable Postop Assessment: no headache, no backache, no apparent nausea or vomiting and patient able to bend at knees Anesthetic complications: no     Last Vitals:  Vitals:   11/10/17 0449 11/10/17 0735  BP: 127/73 (!) 132/57  Pulse: (!) 105 (!) 103  Resp: 16 18  Temp: 36.7 C 37.3 C  SpO2: 97% 96%    Last Pain:  Vitals:   11/10/17 0735  TempSrc: Oral  PainSc:                  Precious Haws Amare Kontos

## 2017-11-10 NOTE — Evaluation (Signed)
Physical Therapy Evaluation Patient Details Name: Elizabeth Fernandez MRN: [id number] DOB: 03-27-1934 Today's Date: 11/10/2017   History of Present Illness  presented to ER secondary to mechanical fall in home environment; admitted with displaced R subcapital hip fracture, status post R hip hemiarthroplasty (11/09/17, PWB, posterior THPs.  Clinical Impression  Patient sleeping upon arrival, but opens eyes to voice.  Lethargic, consistently closing eyes, returning to sleep throughout session without cuing or movement of extremities.   Unable to maintain sats (decrease to mid-80s) without supplemental O2 at this time (93% on 2L end of session).  Patient very painful and guarded to even slightest bit of movement to R LE, tolerating very minimal movement in all planes.  Currently requiring max assist for rolling; total assist +2 for sit/supine and mod assist for unsupported sitting balance edge of bed.  Maintains heavy L lateral weight shift/lean with poor weight acceptance R hip (due to pain).  Unsafe/unable to attempt standing or OOB to chair at this time; will continue to assess/progress as appropriate.  Anticipate very slow, gradual recovery; would benefit from additional pain control options as level of alertness allows. Would benefit from skilled PT to address above deficits and promote optimal return to PLOF; recommend transition to STR upon discharge from acute hospitalization.     Follow Up Recommendations SNF    Equipment Recommendations       Recommendations for Other Services       Precautions / Restrictions Precautions Precautions: Fall;Posterior Hip Restrictions Weight Bearing Restrictions: Yes RLE Weight Bearing: Partial weight bearing      Mobility  Bed Mobility Overal bed mobility: Needs Assistance Bed Mobility: Supine to Sit;Sit to Supine;Rolling Rolling: Max assist   Supine to sit: Total assist;+2 for physical assistance Sit to supine: Total assist;+2 for physical  assistance      Transfers                 General transfer comment: unsafe/unable  Ambulation/Gait             General Gait Details: unsafe/unable  Stairs            Wheelchair Mobility    Modified Rankin (Stroke Patients Only)       Balance Overall balance assessment: Needs assistance Sitting-balance support: No upper extremity supported;Feet supported Sitting balance-Leahy Scale: Poor Sitting balance - Comments: L lateral lean, mod assist to maintain; very fearful/resistant to WBing through R hip       Standing balance comment: unsafe/unable                             Pertinent Vitals/Pain Pain Assessment: Faces Faces Pain Scale: Hurts whole lot Pain Location: R hip Pain Descriptors / Indicators: Aching;Grimacing;Guarding;Moaning Pain Intervention(s): Limited activity within patient's tolerance;Monitored during session;Patient requesting pain meds-RN notified;Repositioned    Home Living Family/patient expects to be discharged to:: Private residence Living Arrangements: Children Available Help at Discharge: Family;Available 24 hours/day Type of Home: House Home Access: Stairs to enter Entrance Stairs-Rails: Can reach both Entrance Stairs-Number of Steps: 3 Home Layout: Two level;Able to live on main level with bedroom/bathroom Home Equipment: Kasandra Knudsen - single point;Walker - 2 wheels;Bedside commode      Prior Function Level of Independence: Independent         Comments: typically is very active and independent w/o AD, walks 1.5 miles multiple days a week; uses [facility] for longer, community distance     Hand Dominance  Extremity/Trunk Assessment   Upper Extremity Assessment Upper Extremity Assessment: Overall WFL for tasks assessed    Lower Extremity Assessment Lower Extremity Assessment: Generalized weakness(R LE grossly 2+/5, very guarded/resistant to movement due to pain; difficult to fully assess strength/ROM.  R  knee and ankle at least 3-/5.  L LE at least 4-/5)       Communication   Communication: No difficulties  Cognition Arousal/Alertness: Lethargic Behavior During Therapy: WFL for tasks assessed/performed                                   General Comments: sluggish responses, increased anxiety with movement      General Comments      Exercises Other Exercises Other Exercises: Supine R LE therex, 1x15, act assist vs passive ROM: ankle pumps, SAQs, heel slides, hip abduct/adduct.  Patient very guarded and resistant, grimacing/yelling in pain at times; tolerating isolated movement only 20-30 degrees of normal range. Other Exercises: Rolling bilat, max assist +1-2 for hygiene, linen change after incontinent bladder episode.  Patient insistent on "doing it herself" and "her way", but unable to initiate any rotation of hips/LEs without phsyical assist from therapist.  Hand-over-hand placement for UE grasp on bedrails to asssist; dep cuing for eyes open, visual scanning to even locate bedrails   Assessment/Plan    PT Assessment Patient needs continued PT services  PT Problem List Decreased strength;Decreased range of motion;Decreased activity tolerance;Decreased balance;Decreased mobility;Decreased coordination;Decreased cognition;Decreased knowledge of use of DME;Decreased safety awareness;Decreased knowledge of precautions;Decreased skin integrity;Pain       PT Treatment Interventions DME instruction;Gait training;Stair training;Functional mobility training;Therapeutic activities;Therapeutic exercise;Balance training;Cognitive remediation;Patient/family education    PT Goals (Current goals can be found in the Care Plan section)  Acute Rehab PT Goals Patient Stated Goal: to try PT Goal Formulation: With patient Time For Goal Achievement: 11/24/17 Potential to Achieve Goals: Fair    Frequency BID   Barriers to discharge        Co-evaluation                AM-PAC PT "6 Clicks" Daily Activity  Outcome Measure Difficulty turning over in bed (including adjusting bedclothes, sheets and blankets)?: Unable Difficulty moving from lying on back to sitting on the side of the bed? : Unable Difficulty sitting down on and standing up from a chair with arms (e.g., wheelchair, bedside commode, etc,.)?: Unable Help needed moving to and from a bed to chair (including a wheelchair)?: Total Help needed walking in hospital room?: Total Help needed climbing 3-5 steps with a railing? : Total 6 Click Score: 6    End of Session Equipment Utilized During Treatment: Gait belt;Oxygen Activity Tolerance: Patient limited by pain Patient left: in bed;with call bell/phone within reach;with bed alarm set Nurse Communication: Mobility status PT Visit Diagnosis: Unsteadiness on feet (R26.81);History of falling (Z91.81);Muscle weakness (generalized) (M62.81);Difficulty in walking, not elsewhere classified (R26.2);Pain Pain - Right/Left: Right Pain - part of body: Hip    Time: 7124-5809 PT Time Calculation (min) (ACUTE ONLY): 39 min   Charges:   PT Evaluation $PT Eval Moderate Complexity: 1 Mod PT Treatments $Therapeutic Exercise: 8-22 mins $Therapeutic Activity: 8-22 mins   PT G Codes:        Vaishnavi Dalby H. Owens Shark, PT, DPT, NCS 11/10/17, 9:58 AM [phone]

## 2017-11-11 ENCOUNTER — Encounter: Payer: Self-pay | Admitting: Specialist

## 2017-11-11 LAB — BASIC METABOLIC PANEL
Anion gap: 4 — ABNORMAL LOW (ref 5–15)
BUN: 14 mg/dL (ref 6–20)
CALCIUM: 7.9 mg/dL — AB (ref 8.9–10.3)
CO2: 31 mmol/L (ref 22–32)
CREATININE: 0.71 mg/dL (ref 0.44–1.00)
Chloride: 101 mmol/L (ref 101–111)
GFR calc Af Amer: >60 mL/min (ref >60)
GFR calc non Af Amer: >60 mL/min (ref >60)
GLUCOSE: 97 mg/dL (ref 65–99)
POTASSIUM: 3.6 mmol/L (ref 3.5–5.1)
SODIUM: 136 mmol/L (ref 135–145)

## 2017-11-11 LAB — CBC
HCT: 29.9 % — ABNORMAL LOW (ref 35.0–47.0)
Hemoglobin: 10.3 g/dL — ABNORMAL LOW (ref 12.0–16.0)
MCH: 30.6 pg (ref 26.0–34.0)
MCHC: 34.4 g/dL (ref 32.0–36.0)
MCV: 89 fL (ref 80.0–100.0)
PLATELETS: 142 10*3/uL — AB (ref 150–440)
RBC: 3.35 MIL/uL — ABNORMAL LOW (ref 3.80–5.20)
RDW: 12.7 % (ref 11.5–14.5)
WBC: 8.7 10*3/uL (ref 3.6–11.0)

## 2017-11-11 MED ORDER — ENOXAPARIN SODIUM 40 MG/0.4ML ~~LOC~~ SOLN
40.0000 mg | SUBCUTANEOUS | Status: DC
  Administered 2017-11-12: 40 mg via SUBCUTANEOUS
  Filled 2017-11-11: qty 0.4

## 2017-11-11 MED ORDER — MORPHINE SULFATE (PF) 2 MG/ML IV SOLN
1.0000 mg | INTRAVENOUS | Status: DC | PRN
Start: 2017-11-11 — End: 2017-11-12

## 2017-11-11 NOTE — Progress Notes (Signed)
Clinical Education officer, museum (CSW) stated [facility] SNF authorization via [place] [facility] today.   [employer], LCSW [phone]

## 2017-11-11 NOTE — Progress Notes (Signed)
lovenox increased to 40mg  daily due to crcl >27ml/min and TBW >45 kg  Kendrik Mcshan D Kaesha Kirsch, Pharm.D, BCPS Clinical Pharmacist

## 2017-11-11 NOTE — Progress Notes (Signed)
[facility] Medicare SNF authorization has been received through [facility], authorization # [id number], RVC. Plan is for patient to D/C to [facility] tomorrow pending medical clearance. [facility] admissions coordinator at [facility] is aware of above. Patient is aware of above. CSW contacted patient's son Lennette Bihari and made him aware of above.   [employer], LCSW [phone]

## 2017-11-11 NOTE — Progress Notes (Signed)
[place] at [facility]                                                                                                                                                                                  Patient Demographics   Karna Abed, is a 84 y.o. female, DOB - 1934/05/13, GMW:[id number]  Admit date - 11/07/2017   Admitting Physician Lance Coon, MD  Outpatient Primary MD for the patient is Arnett, Yvetta Coder, FNP   LOS - 3  Subjective: Patient still having pain some improvement in pain   Review of Systems:   CONSTITUTIONAL: No documented fever. No fatigue, weakness. No weight gain, no weight loss.  EYES: No blurry or double vision.  ENT: No tinnitus. No postnasal drip. No redness of the oropharynx.  RESPIRATORY: No cough, no wheeze, no hemoptysis. No dyspnea.  CARDIOVASCULAR: No chest pain. No orthopnea. No palpitations. No syncope.  GASTROINTESTINAL: No nausea, no vomiting or diarrhea. No abdominal pain. No melena or hematochezia.  GENITOURINARY: No dysuria or hematuria.  ENDOCRINE: No polyuria or nocturia. No heat or cold intolerance.  HEMATOLOGY: No anemia. No bruising. No bleeding.  INTEGUMENTARY: No rashes. No lesions.  MUSCULOSKELETAL: No arthritis. No swelling. No gout.  NEUROLOGIC: No numbness, tingling, or ataxia. No seizure-type activity.  PSYCHIATRIC: No anxiety. No insomnia. No ADD.    Vitals:   Vitals:   11/10/17 1619 11/10/17 2330 11/11/17 0754 11/11/17 1100  BP: (!) 124/58 (!) 106/58 (!) 140/58   Pulse: (!) 111 97 (!) 104 (!) 116  Resp: 18 18 18    Temp: 98.9 F (37.2 C) 98.4 F (36.9 C) 98.8 F (37.1 C)   TempSrc: Oral Oral Oral   SpO2: 95% 98% 93% (!) 80%  Weight:      Height:        Wt Readings from Last 3 Encounters:  11/08/17 63.3 kg (139 lb 9.6 oz)  03/26/17 67.6 kg (149 lb)  02/25/17 65.9 kg (145 lb 3.2 oz)     Intake/Output Summary (Last 24 hours) at 11/11/2017 1358 Last data filed at 11/11/2017 0900 Gross per 24  hour  Intake 360 ml  Output 400 ml  Net -40 ml    Physical Exam:   GENERAL: Pleasant-appearing in no apparent distress.  HEAD, EYES, EARS, NOSE AND THROAT: Atraumatic, normocephalic. Extraocular muscles are intact. Pupils equal and reactive to light. Sclerae anicteric. No conjunctival injection. No oro-pharyngeal erythema.  NECK: Supple. There is no jugular venous distention. No bruits, no lymphadenopathy, no thyromegaly.  HEART: Regular rate and rhythm,. No murmurs, no rubs, no clicks.  LUNGS: Clear to auscultation bilaterally. No rales or rhonchi. No wheezes.  ABDOMEN: Soft,  flat, nontender, nondistended. Has good bowel sounds. No hepatosplenomegaly appreciated.  EXTREMITIES: No evidence of any cyanosis, clubbing, or peripheral edema.  +2 pedal and radial pulses bilaterally.  NEUROLOGIC: The patient is alert, awake, and oriented x3 with no focal motor or sensory deficits appreciated bilaterally.  SKIN: Moist and warm with no rashes appreciated.  Psych: Not anxious, depressed LN: No inguinal LN enlargement    Antibiotics   Anti-infectives (From admission, onward)   Start     Dose/Rate Route Frequency Ordered Stop   11/09/17 1400  ceFAZolin (ANCEF) IVPB 2g/100 mL premix     2 g 200 mL/hr over 30 Minutes Intravenous Every 8 hours 11/09/17 1129 11/10/17 0550   11/09/17 1400  clindamycin (CLEOCIN) IVPB 600 mg     600 mg 100 mL/hr over 30 Minutes Intravenous Every 8 hours 11/09/17 1129 11/10/17 0634   11/08/17 0600  ceFAZolin (ANCEF) IVPB 2g/100 mL premix     2 g 200 mL/hr over 30 Minutes Intravenous On call to O.R. 11/08/17 0008 11/09/17 0559   11/08/17 0600  clindamycin (CLEOCIN) IVPB 600 mg     600 mg 100 mL/hr over 30 Minutes Intravenous On call to O.R. 11/08/17 0008 11/09/17 0559      Medications   Scheduled Meds: . docusate sodium  100 mg Oral BID  . [START ON 11/12/2017] enoxaparin (LOVENOX) injection  40 mg Subcutaneous Q24H  . feeding supplement (ENSURE ENLIVE)  237 mL  Oral BID BM  . ferrous sulfate  325 mg Oral Q breakfast  . gabapentin  300 mg Oral TID  . meloxicam  15 mg Oral Once  . traMADol  50 mg Oral Q6H   Continuous Infusions: . methocarbamol (ROBAXIN)  IV     PRN Meds:.acetaminophen **OR** acetaminophen, alum & mag hydroxide-simeth, bisacodyl, HYDROcodone-acetaminophen, menthol-cetylpyridinium **OR** phenol, methocarbamol **OR** methocarbamol (ROBAXIN)  IV, metoCLOPramide **OR** metoCLOPramide (REGLAN) injection, morphine injection, ondansetron **OR** ondansetron (ZOFRAN) IV, sodium phosphate, zolpidem   Data Review:   Micro Results Recent Results (from the past 240 hour(s))  Surgical pcr screen     Status: None   Collection Time: 11/08/17  1:52 AM  Result Value Ref Range Status   MRSA, PCR NEGATIVE NEGATIVE Final   Staphylococcus aureus NEGATIVE NEGATIVE Final    Comment: (NOTE) The Xpert SA Assay (FDA approved for NASAL specimens in patients 49 years of age and older), is one component of a comprehensive surveillance program. It is not intended to diagnose infection nor to guide or monitor treatment. Performed at [facility], [address], [place], [place] [address]     Radiology Reports Dg Chest 1 View  Result Date: 11/08/2017 CLINICAL DATA:  Slip and fall injury with right hip pain. EXAM: CHEST  1 VIEW COMPARISON:  11/10/2014 FINDINGS: Shallow inspiration. Cardiac enlargement. No pulmonary vascular congestion. No airspace disease or consolidation in the lungs. No blunting of costophrenic angles. No pneumothorax. Calcification of the aorta. Degenerative changes in the spine. Old left rib fractures. Surgical clips in the right upper quadrant. IMPRESSION: No evidence of active pulmonary disease.  Aortic atherosclerosis. Electronically Signed   By: Lucienne Capers M.D.   On: 11/08/2017 00:13   Dg Hip Port Unilat With Pelvis 1v Right  Result Date: 11/09/2017 CLINICAL DATA:  Status post hip replacement EXAM: DG HIP (WITH OR  WITHOUT PELVIS) 1V PORT RIGHT COMPARISON:  None. FINDINGS: The patient is status post right hip replacement. Acetabular and femoral components are in good position. A gamma nail and rod associated  with the left proximal femur are unchanged since Dec 07, 2017. IMPRESSION: Status post right hip replacement as above. Electronically Signed   By: Dorise Bullion III M.D   On: 11/09/2017 11:33   Dg Hip Unilat W Or Wo Pelvis 2-3 Views Right  Result Date: 11/08/2017 CLINICAL DATA:  Fall EXAM: DG HIP (WITH OR WITHOUT PELVIS) 2-3V RIGHT COMPARISON:  None. FINDINGS: There is a superiorly displaced fracture of the right femoral neck. No other pelvic fracture is identified. The femoral head remains situated in the acetabulum. Left femoral hardware where is normal. IMPRESSION: Superiorly displaced transverse fracture of the right femoral neck. Electronically Signed   By: Ulyses Jarred M.D.   On: 11/08/2017 00:12     CBC Recent Labs  Lab 11/08/17 0009 11/09/17 1223 11/10/17 0410 11/11/17 0452  WBC 8.3 10.0 9.1 8.7  HGB 12.0 12.1 10.3* 10.3*  HCT 35.7 36.0 30.9* 29.9*  PLT 202 181 150 142*  MCV 89.3 88.9 89.3 89.0  MCH 30.1 29.8 29.7 30.6  MCHC 33.7 33.5 33.2 34.4  RDW 12.8 12.3 12.3 12.7    Chemistries  Recent Labs  Lab 11/08/17 0009 11/09/17 1223 11/10/17 0410 11/11/17 0452  NA 144  --  133* 136  K 4.1  --  3.7 3.6  CL 108  --  103 101  CO2 30  --  25 31  GLUCOSE 117*  --  129* 97  BUN 22*  --  15 14  CREATININE 0.81 0.67 0.73 0.71  CALCIUM 8.6*  --  7.6* 7.9*  AST 21  --   --   --   ALT 17  --   --   --   ALKPHOS 95  --   --   --   BILITOT 0.6  --   --   --    ------------------------------------------------------------------------------------------------------------------ estimated creatinine clearance is 48.8 mL/min (by C-G formula based on SCr of 0.71 mg/dL). ------------------------------------------------------------------------------------------------------------------ No  results for input(s): HGBA1C in the last 72 hours. ------------------------------------------------------------------------------------------------------------------ No results for input(s): CHOL, HDL, LDLCALC, TRIG, CHOLHDL, LDLDIRECT in the last 72 hours. ------------------------------------------------------------------------------------------------------------------ No results for input(s): TSH, T4TOTAL, T3FREE, THYROIDAB in the last 72 hours.  Invalid input(s): FREET3 ------------------------------------------------------------------------------------------------------------------ No results for input(s): VITAMINB12, FOLATE, FERRITIN, TIBC, IRON, RETICCTPCT in the last 72 hours.  Coagulation profile Recent Labs  Lab 11/08/17 0009  INR 0.99    No results for input(s): DDIMER in the last 72 hours.  Cardiac Enzymes No results for input(s): CKMB, TROPONINI, MYOGLOBIN in the last 168 hours.  Invalid input(s): CK ------------------------------------------------------------------------------------------------------------------ Invalid input(s): [place]   Patient is 84 year old with closed right hip fracture  1.  Closed right hip fracture status post repair pain control  2.  Previous history of DVT recommend postop prophylaxis per orthopedics  3.  Elevated heart rate continue to monitor likely related to pain  4.  Miscellaneous Lovenox for DVT prophylaxis      Code Status Orders  (From admission, onward)        Start     Ordered   11/08/17 0133  Full code  Continuous     11/08/17 0132    Code Status History    Date Active Date Inactive Code Status Order ID Comments User Context   02/19/2017 0413 02/20/2017 1453 Full Code [id number]  Harrie Foreman, MD ED   01/21/2017 1359 01/25/2017 2018 Full Code [id number]  Vaughan Basta, MD Inpatient    Advance Directive Documentation  Most Recent Value  Type of Advance Directive  Living will   Pre-existing out of facility DNR order (yellow form or pink MOST form)  -  "MOST" Form in Place?  -        Discharge to rehab tomorrow   Consults orthopedic  DVT Prophylaxis  Lovenox  Lab Results  Component Value Date   PLT 142 (L) 11/11/2017     Time Spent in minutes 25 minutes greater than 50% of time spent in care coordination and counseling patient regarding the condition and plan of care.   Dustin Flock M.D on 11/11/2017 at 1:58 PM  Between 7am to 6pm - Pager - [phone]  After 6pm go to [email or web address] - Proofreader  [employer]   Office  [phone]

## 2017-11-11 NOTE — Progress Notes (Signed)
Physical Therapy Treatment Patient Details Name: Elizabeth Fernandez MRN: [id number] DOB: Dec 31, 1933 Today's Date: 11/11/2017    History of Present Illness presented to ER secondary to mechanical fall in home environment; admitted with displaced R subcapital hip fracture, status post R hip hemiarthroplasty (11/09/17, PWB, posterior THPs.    PT Comments    Pt agreeable to PT; reports 9/10 pain R hip/LE. Pt demonstrating slow progression, but improvement with bed mobility today to Mod A of 1 supine to sit and Max A sit to supine. Pt does require heavy encouragement and sequence cues. Education regarding posterior hip precautions, as pt does not remember; reminder cues as well during function. Pt tolerates seated edge of bed with limited RLE exercise x 12 minutes. Continue PT to progress strength, endurance and activity tolerance to improve all functional mobility.    Follow Up Recommendations  SNF     Equipment Recommendations       Recommendations for Other Services       Precautions / Restrictions Precautions Precautions: Fall;Posterior Hip Restrictions Weight Bearing Restrictions: Yes RLE Weight Bearing: Partial weight bearing    Mobility  Bed Mobility Overal bed mobility: Needs Assistance Bed Mobility: Supine to Sit;Sit to Supine;Rolling     Supine to sit: Mod assist Sit to supine: Max assist   General bed mobility comments: Requires sequence cues and encouragement with Mod A. Max to return   Transfers                 General transfer comment: Unable to attempt  Ambulation/Gait                 Stairs            Wheelchair Mobility    Modified Rankin (Stroke Patients Only)       Balance                                            Cognition Arousal/Alertness: Awake/alert Behavior During Therapy: WFL for tasks assessed/performed Overall Cognitive Status: Within Functional Limits for tasks assessed                                         Exercises Total Joint Exercises Ankle Circles/Pumps: AROM;Both;20 reps;Seated Gluteal Sets: Strengthening;Both;10 reps;Supine Short Arc Quad: AROM;Right;10 reps;Seated(2 sets; beginning range, not terminal range) Hip ABduction/ADduction: AAROM;Right;10 reps;Supine    General Comments        Pertinent Vitals/Pain Pain Assessment: 0-10 Pain Score: 9  Pain Location: R hip Pain Descriptors / Indicators: Constant;Operative site guarding;Sharp;Throbbing(yelling) Pain Intervention(s): Limited activity within patient's tolerance    Home Living                      Prior Function            PT Goals (current goals can now be found in the care plan section) Progress towards PT goals: Progressing toward goals(slowly)    Frequency    BID      PT Plan Current plan remains appropriate    Co-evaluation              AM-PAC PT "6 Clicks" Daily Activity  Outcome Measure  Difficulty turning over in bed (including adjusting bedclothes, sheets and blankets)?: Unable Difficulty moving from lying  on back to sitting on the side of the bed? : Unable Difficulty sitting down on and standing up from a chair with arms (e.g., wheelchair, bedside commode, etc,.)?: Unable Help needed moving to and from a bed to chair (including a wheelchair)?: Total Help needed walking in hospital room?: Total Help needed climbing 3-5 steps with a railing? : Total 6 Click Score: 6    End of Session Equipment Utilized During Treatment: Oxygen Activity Tolerance: Patient limited by pain Patient left: in bed;with call bell/phone within reach;with bed alarm set;with family/visitor present   PT Visit Diagnosis: Unsteadiness on feet (R26.81);History of falling (Z91.81);Muscle weakness (generalized) (M62.81);Difficulty in walking, not elsewhere classified (R26.2);Pain Pain - Right/Left: Right Pain - part of body: Hip     Time: 1100-1129 PT Time Calculation (min) (ACUTE  ONLY): 29 min  Charges:  $Therapeutic Exercise: 8-22 mins $Therapeutic Activity: 8-22 mins                    G Codes:        Larae Grooms, PTA 11/11/2017, 1:40 PM

## 2017-11-11 NOTE — Care Management Important Message (Signed)
Important Message  Patient Details  Name: Elizabeth Fernandez MRN: [id number] Date of Birth: 1934/06/28   Medicare Important Message Given:  Yes    Juliann Pulse A Syerra Abdelrahman 11/11/2017, 10:30 AM

## 2017-11-11 NOTE — Progress Notes (Signed)
02 sat 78 on room air. Place 02 @ 2 liters back on pt to bring saturation to 95%

## 2017-11-11 NOTE — Progress Notes (Signed)
Subjective: 2 Days Post-Op Procedure(s) (LRB): ARTHROPLASTY BIPOLAR HIP (HEMIARTHROPLASTY) (Right)    Patient reports pain as mild.  Objective:   VITALS:   Vitals:   11/10/17 2330 11/11/17 0754  BP: (!) 106/58 (!) 140/58  Pulse: 97 (!) 104  Resp: 18 18  Temp: 98.4 F (36.9 C) 98.8 F (37.1 C)  SpO2: 98% 93%    Neurologically intact ABD soft Neurovascular intact Sensation intact distally Intact pulses distally Dorsiflexion/Plantar flexion intact Incision: dressing C/D/I  LABS Recent Labs    11/09/17 1223 11/10/17 0410 11/11/17 0452  HGB 12.1 10.3* 10.3*  HCT 36.0 30.9* 29.9*  WBC 10.0 9.1 8.7  PLT 181 150 142*    Recent Labs    11/09/17 1223 11/10/17 0410 11/11/17 0452  NA  --  133* 136  K  --  3.7 3.6  BUN  --  15 14  CREATININE 0.67 0.73 0.71  GLUCOSE  --  129* 97    No results for input(s): LABPT, INR in the last 72 hours.   Assessment/Plan: 2 Days Post-Op Procedure(s) (LRB): ARTHROPLASTY BIPOLAR HIP (HEMIARTHROPLASTY) (Right)   Advance diet Up with therapy D/C IV fluids Discharge to SNF   Enteric-coated aspirin 325 mg twice daily for 6 weeks.  Return to my office in 2 weeks for staple removal.

## 2017-11-11 NOTE — Progress Notes (Addendum)
Physical Therapy Treatment Patient Details Name: Elizabeth Fernandez MRN: [id number] DOB: 1934/02/29 Today's Date: 11/11/2017    History of Present Illness presented to ER secondary to mechanical fall in home environment; admitted with displaced R subcapital hip fracture, status post R hip hemiarthroplasty (11/09/17, PWB, posterior THPs.    PT Comments    Pt agreeable to PT for supine bed exercises this afternoon. Reports 6/10 pain RLE.  Pt requires assist for most exercises and re education for isometric exercises with tactile cueing. Mild increased anxiety due to pain with Right lower extremity movement. Overall tolerated well. Continue PT to progress participation, assisted movement and low level strengthening to improve functional mobility as tolerated.   Follow Up Recommendations  SNF     Equipment Recommendations       Recommendations for Other Services       Precautions / Restrictions Precautions Precautions: Fall;Posterior Hip Precaution Comments: Requires re education on all hip precautions Restrictions Weight Bearing Restrictions: Yes RLE Weight Bearing: Partial weight bearing    Mobility  Bed Mobility Overal bed mobility: Needs Assistance Bed Mobility: Supine to Sit;Sit to Supine;Rolling     Supine to sit: Mod assist Sit to supine: Max assist   General bed mobility comments: Refuses up in bed at this time due to wishing to maintain pain at a current lower level  Transfers                 General transfer comment: Unable to attempt  Ambulation/Gait                 Stairs            Wheelchair Mobility    Modified Rankin (Stroke Patients Only)       Balance                                            Cognition Arousal/Alertness: Awake/alert Behavior During Therapy: WFL for tasks assessed/performed Overall Cognitive Status: Within Functional Limits for tasks assessed                                         Exercises Total Joint Exercises Ankle Circles/Pumps: AROM;Both;20 reps;Seated Quad Sets: Strengthening;Both;15 reps;Supine Gluteal Sets: Strengthening;Both;Supine;15 reps Short Arc Quad: AAROM;Right;15 reps;Supine(AROM L 15x) Heel Slides: AAROM;Right;15 reps;Supine(AROM L 15x) Hip ABduction/ADduction: AAROM;Both;15 reps;Supine Straight Leg Raises: AAROM;Both;15 reps;Supine    General Comments        Pertinent Vitals/Pain Pain Assessment: 0-10 Pain Score: 6  Pain Location: R hip Pain Descriptors / Indicators: Constant;Operative site guarding;Sharp;Throbbing(yelling) Pain Intervention(s): Limited activity within patient's tolerance;Monitored during session    Home Living                      Prior Function            PT Goals (current goals can now be found in the care plan section) Progress towards PT goals: Progressing toward goals(slowly)    Frequency    BID      PT Plan Current plan remains appropriate    Co-evaluation              AM-PAC PT "6 Clicks" Daily Activity  Outcome Measure  Difficulty turning over in bed (including adjusting bedclothes, sheets and blankets)?: Unable  Difficulty moving from lying on back to sitting on the side of the bed? : Unable Difficulty sitting down on and standing up from a chair with arms (e.g., wheelchair, bedside commode, etc,.)?: Unable Help needed moving to and from a bed to chair (including a wheelchair)?: Total Help needed walking in hospital room?: Total Help needed climbing 3-5 steps with a railing? : Total 6 Click Score: 6    End of Session Equipment Utilized During Treatment: Oxygen Activity Tolerance: Patient limited by pain;Patient limited by fatigue Patient left: in bed;with call bell/phone within reach;with bed alarm set;with family/visitor present   PT Visit Diagnosis: Unsteadiness on feet (R26.81);History of falling (Z91.81);Muscle weakness (generalized) (M62.81);Difficulty in walking,  not elsewhere classified (R26.2);Pain Pain - Right/Left: Right Pain - part of body: Hip     Time: 3491-7915 PT Time Calculation (min) (ACUTE ONLY): 23 min  Charges:  $Therapeutic Exercise: 23-37 mins $Therapeutic Activity: 8-22 mins                    G CodesLarae Grooms, PTA 11/11/2017, 2:44 PM

## 2017-11-12 ENCOUNTER — Other Ambulatory Visit: Payer: Self-pay

## 2017-11-12 DIAGNOSIS — M1990 Unspecified osteoarthritis, unspecified site: Secondary | ICD-10-CM | POA: Diagnosis not present

## 2017-11-12 DIAGNOSIS — Z9181 History of falling: Secondary | ICD-10-CM | POA: Diagnosis not present

## 2017-11-12 DIAGNOSIS — M80051D Age-related osteoporosis with current pathological fracture, right femur, subsequent encounter for fracture with routine healing: Secondary | ICD-10-CM | POA: Diagnosis not present

## 2017-11-12 DIAGNOSIS — R2689 Other abnormalities of gait and mobility: Secondary | ICD-10-CM | POA: Diagnosis not present

## 2017-11-12 DIAGNOSIS — Z7951 Long term (current) use of inhaled steroids: Secondary | ICD-10-CM | POA: Diagnosis not present

## 2017-11-12 DIAGNOSIS — Z86718 Personal history of other venous thrombosis and embolism: Secondary | ICD-10-CM | POA: Diagnosis not present

## 2017-11-12 DIAGNOSIS — M6281 Muscle weakness (generalized): Secondary | ICD-10-CM | POA: Diagnosis not present

## 2017-11-12 DIAGNOSIS — Z7401 Bed confinement status: Secondary | ICD-10-CM | POA: Diagnosis not present

## 2017-11-12 DIAGNOSIS — R000 Tachycardia, unspecified: Secondary | ICD-10-CM | POA: Diagnosis not present

## 2017-11-12 DIAGNOSIS — Z7901 Long term (current) use of anticoagulants: Secondary | ICD-10-CM | POA: Diagnosis not present

## 2017-11-12 DIAGNOSIS — J439 Emphysema, unspecified: Secondary | ICD-10-CM | POA: Diagnosis not present

## 2017-11-12 DIAGNOSIS — M84459A Pathological fracture, hip, unspecified, initial encounter for fracture: Secondary | ICD-10-CM | POA: Diagnosis not present

## 2017-11-12 DIAGNOSIS — S72001D Fracture of unspecified part of neck of right femur, subsequent encounter for closed fracture with routine healing: Secondary | ICD-10-CM | POA: Diagnosis not present

## 2017-11-12 DIAGNOSIS — R350 Frequency of micturition: Secondary | ICD-10-CM | POA: Diagnosis present

## 2017-11-12 DIAGNOSIS — S72001A Fracture of unspecified part of neck of right femur, initial encounter for closed fracture: Secondary | ICD-10-CM | POA: Diagnosis not present

## 2017-11-12 DIAGNOSIS — S72031A Displaced midcervical fracture of right femur, initial encounter for closed fracture: Secondary | ICD-10-CM | POA: Diagnosis not present

## 2017-11-12 DIAGNOSIS — M48062 Spinal stenosis, lumbar region with neurogenic claudication: Secondary | ICD-10-CM | POA: Diagnosis not present

## 2017-11-12 DIAGNOSIS — Z96611 Presence of right artificial shoulder joint: Secondary | ICD-10-CM | POA: Diagnosis not present

## 2017-11-12 DIAGNOSIS — M8000XA Age-related osteoporosis with current pathological fracture, unspecified site, initial encounter for fracture: Secondary | ICD-10-CM | POA: Diagnosis not present

## 2017-11-12 DIAGNOSIS — R39198 Other difficulties with micturition: Secondary | ICD-10-CM | POA: Diagnosis not present

## 2017-11-12 DIAGNOSIS — Z96641 Presence of right artificial hip joint: Secondary | ICD-10-CM | POA: Diagnosis not present

## 2017-11-12 LAB — CBC
HCT: 27.8 % — ABNORMAL LOW (ref 35.0–47.0)
HEMOGLOBIN: 9.5 g/dL — AB (ref 12.0–16.0)
MCH: 30.2 pg (ref 26.0–34.0)
MCHC: 34 g/dL (ref 32.0–36.0)
MCV: 88.8 fL (ref 80.0–100.0)
Platelets: 188 10*3/uL (ref 150–440)
RBC: 3.13 MIL/uL — ABNORMAL LOW (ref 3.80–5.20)
RDW: 12.4 % (ref 11.5–14.5)
WBC: 8.7 10*3/uL (ref 3.6–11.0)

## 2017-11-12 MED ORDER — DOCUSATE SODIUM 100 MG PO CAPS: 100.0000 mg | ORAL_CAPSULE | Freq: Two times a day (BID) | ORAL | 0 refills | Status: DC

## 2017-11-12 MED ORDER — ASPIRIN EC 325 MG PO TBEC: 325.0000 mg | DELAYED_RELEASE_TABLET | Freq: Two times a day (BID) | ORAL | 0 refills | Status: DC

## 2017-11-12 MED ORDER — HYDROCODONE-ACETAMINOPHEN 5-325 MG PO TABS: 1.0000 | ORAL_TABLET | ORAL | 0 refills | Status: DC | PRN

## 2017-11-12 MED ORDER — ACETAMINOPHEN 325 MG PO TABS: 650.0000 mg | ORAL_TABLET | Freq: Four times a day (QID) | ORAL | Status: DC | PRN

## 2017-11-12 MED ORDER — GABAPENTIN 300 MG PO CAPS: 300.0000 mg | ORAL_CAPSULE | Freq: Three times a day (TID) | ORAL | Status: DC

## 2017-11-12 MED ORDER — RIVAROXABAN 10 MG PO TABS: 10.0000 mg | ORAL_TABLET | Freq: Every day | ORAL | Status: DC

## 2017-11-12 NOTE — Progress Notes (Signed)
Physical Therapy Treatment Patient Details Name: Elizabeth Fernandez MRN: [id number] DOB: [birth year] Today's Date: 11/12/2017    History of Present Illness presented to ER secondary to mechanical fall in home environment; admitted with displaced R subcapital hip fracture, status post R hip hemiarthroplasty (11/09/17, PWB, posterior THPs.    PT Comments    To edge of bed with mod a x 2 and pt given increased time to complete tasks as much as she can on her own.  Stood from EOB with mod a x 2 and walker.  While standing exercises as below for RLE.  She attempted stepping several times but would buckle with attempt to increase WB on RLE.  She is PWB and was unable to effectively put much weight on RLE at all.  After seated rest, she stood again for care as she was inc of urine.  Nursing in to assist.  She remained unable to transfer with walker.  She again sat and stand pivot without walker was used to get her safely to the chair.  Pt pleased with her progress today and increased mobility.   Follow Up Recommendations  SNF     Equipment Recommendations       Recommendations for Other Services       Precautions / Restrictions Precautions Precautions: Fall;Posterior Hip Precaution Comments: Requires re education on all hip precautions Restrictions Weight Bearing Restrictions: Yes RLE Weight Bearing: Partial weight bearing    Mobility  Bed Mobility Overal bed mobility: Needs Assistance Bed Mobility: Supine to Sit Rolling: +2 for physical assistance;Mod assist   Supine to sit: Mod assist;+2 for physical assistance     General bed mobility comments: good effort when given time to complete tasks.  Transfers Overall transfer level: Needs assistance Equipment used: Rolling walker (2 wheeled) Transfers: Sit to/from Stand Sit to Stand: Mod assist;+2 physical assistance Stand pivot transfers: Max assist;+2 physical assistance       General transfer comment: While she is able to stand with  walker, she is unable to take any steps without knees buckling.  Stand pivot without walker used to get to chair.  Ambulation/Gait             General Gait Details: unsafe/unable   Stairs            Wheelchair Mobility    Modified Rankin (Stroke Patients Only)       Balance Overall balance assessment: Needs assistance Sitting-balance support: No upper extremity supported;Feet supported Sitting balance-Leahy Scale: Poor Sitting balance - Comments: requires supervision/min gaurd at all times.   Standing balance support: Bilateral upper extremity supported Standing balance-Leahy Scale: Poor                              Cognition Arousal/Alertness: Awake/alert Behavior During Therapy: WFL for tasks assessed/performed Overall Cognitive Status: Within Functional Limits for tasks assessed                                        Exercises Other Exercises Other Exercises: standing for several minutes with walker and +2 assist.  She was able to hip/knee flex, SLR and ab/add x 5 each on right.  Buckles with attempts at stepping.    General Comments        Pertinent Vitals/Pain Pain Assessment: 0-10 Pain Score: 3  Pain Location: R hip Pain Descriptors /  Indicators: Sore;Operative site guarding Pain Intervention(s): Limited activity within patient's tolerance    Home Living                      Prior Function            PT Goals (current goals can now be found in the care plan section)      Frequency    BID      PT Plan Current plan remains appropriate    Co-evaluation              AM-PAC PT "6 Clicks" Daily Activity  Outcome Measure  Difficulty turning over in bed (including adjusting bedclothes, sheets and blankets)?: Unable Difficulty moving from lying on back to sitting on the side of the bed? : Unable Difficulty sitting down on and standing up from a chair with arms (e.g., wheelchair, bedside  commode, etc,.)?: Unable Help needed moving to and from a bed to chair (including a wheelchair)?: Total Help needed walking in hospital room?: Total Help needed climbing 3-5 steps with a railing? : Total 6 Click Score: 6    End of Session Equipment Utilized During Treatment: Oxygen Activity Tolerance: Patient limited by fatigue Patient left: in chair;with chair alarm set;with call bell/phone within reach;with nursing/sitter in room Nurse Communication: Mobility status Pain - Right/Left: Right Pain - part of body: Hip     Time:  -     Charges:                       G Codes:       Chesley Noon, PTA 11/12/17, 10:05 AM

## 2017-11-12 NOTE — Discharge Summary (Deleted)
Allergies as of 11/12/2017      Reactions   Pollen Extract       Medication List    STOP taking these medications   aspirin 325 MG tablet     TAKE these medications   acetaminophen 325 MG tablet Commonly known as:  TYLENOL Take 2 tablets (650 mg total) by mouth every 6 (six) hours as needed for mild pain (or Fever >/= 101).   docusate sodium 100 MG capsule Commonly known as:  COLACE Take 1 capsule (100 mg total) by mouth 2 (two) times daily.   gabapentin 300 MG capsule Commonly known as:  NEURONTIN Take 1 capsule (300 mg total) by mouth 3 (three) times daily.   HYDROcodone-acetaminophen 5-325 MG tablet Commonly known as:  NORCO/VICODIN Take 1-2 tablets by mouth every 4 (four) hours as needed for moderate pain (pain score 4-6).   rivaroxaban 10 MG Tabs tablet Commonly known as:  XARELTO Take 1 tablet (10 mg total) by mouth daily. Start taking on:  11/13/2017      Total Time in preparing paper work, data evaluation and todays exam - 11 minutes  Dustin Flock M.D on 11/12/2017 at 9:47 AM [place]  [phone]

## 2017-11-12 NOTE — Clinical Social Work Placement (Signed)
   CLINICAL SOCIAL WORK PLACEMENT  NOTE  Date:  11/12/2017  Patient Details  Name: Elizabeth Fernandez MRN: [id number] Date of Birth: 1933/12/17  Clinical Social Work is seeking post-discharge placement for this patient at the [place] level of care (*CSW will initial, date and re-position this form in  chart as items are completed):  Yes   Patient/family provided with [facility] Work Department's list of facilities offering this level of care within the geographic area requested by the patient (or if unable, by the patient's family).  Yes   Patient/family informed of their freedom to choose among providers that offer the needed level of care, that participate in Medicare, Medicaid or managed care program needed by the patient, have an available bed and are willing to accept the patient.  Yes   Patient/family informed of [facility]'s ownership interest in [facility] and [facility], as well as of the fact that they are under no obligation to receive care at these facilities.  PASRR submitted to EDS on       PASRR number received on       Existing PASRR number confirmed on 11/08/17     FL2 transmitted to all facilities in geographic area requested by pt/family on 11/08/17     FL2 transmitted to all facilities within larger geographic area on       Patient informed that his/her managed care company has contracts with or will negotiate with certain facilities, including the following:        Yes   Patient/family informed of bed offers received.  Patient chooses bed at [facility] )     Physician recommends and patient chooses bed at      Patient to be transferred to [facility] ) on 11/12/17.  Patient to be transferred to facility by [facility] )     Patient family notified on 11/12/17 of transfer.  Name of family member notified:  (Patient's son Lennette Bihari is at bedside and aware of D/C today. )     PHYSICIAN        Additional Comment:    _______________________________________________ Latham Kinzler, Veronia Beets, LCSW 11/12/2017, 11:04 AM

## 2017-11-12 NOTE — Discharge Summary (Signed)
[facility] [place] at [facility], 84 y.o., DOB 06/12/1934, MRN [id number]. Admission date: 11/07/2017 Discharge Date 11/12/2017 Primary MD Burnard Hawthorne, FNP Admitting Physician Lance Coon, MD  Admission Diagnosis   Weakness  Discharge Diagnosis   Principal Problem: Closed right hip fracture History of DVT Acute blood loss anemia     Hospital Course  Patient is 85 year old female presented to the hospital after a fall.  Patient was noted to have a hip fracture.  She was seen by orthopedic surgery and underwent surgery.  She tolerated the procedure.  She had a lot of pain post procedure.  However pain is now improved.  The orthopedic surgeon Dr. Earnestine Leys  recommended xarelto 10mg  daily for  6 weeks post surgery due to her history of having DVT post procedure in the past.  She will also follow-up with him and 2 weeks for staple removal.      Xarelto please start tommorow 11/13/17   Patient currently requiring oxygen which will need to continue and weaned off slowly at the facility.  Recommend she continue use incentive spirometer.           Consults  orthopedic surgery  Significant Tests:  See full reports for all details    Dg Chest 1 View  Result Date: 11/08/2017 CLINICAL DATA:  Slip and fall injury with right hip pain. EXAM: CHEST  1 VIEW COMPARISON:  11/10/2014 FINDINGS: Shallow inspiration. Cardiac enlargement. No pulmonary vascular congestion. No airspace disease or consolidation in the lungs. No blunting of costophrenic angles. No pneumothorax. Calcification of the aorta. Degenerative changes in the spine. Old left rib fractures. Surgical clips in the right upper quadrant. IMPRESSION: No evidence of active pulmonary disease.  Aortic atherosclerosis. Electronically Signed   By: Lucienne Capers M.D.   On: 11/08/2017 00:13   Dg Hip Port Unilat With Pelvis 1v Right  Result Date: 11/09/2017 CLINICAL DATA:  Status post hip  replacement EXAM: DG HIP (WITH OR WITHOUT PELVIS) 1V PORT RIGHT COMPARISON:  None. FINDINGS: The patient is status post right hip replacement. Acetabular and femoral components are in good position. A gamma nail and rod associated with the left proximal femur are unchanged since Dec 07, 2017. IMPRESSION: Status post right hip replacement as above. Electronically Signed   By: Dorise Bullion III M.D   On: 11/09/2017 11:33   Dg Hip Unilat W Or Wo Pelvis 2-3 Views Right  Result Date: 11/08/2017 CLINICAL DATA:  Fall EXAM: DG HIP (WITH OR WITHOUT PELVIS) 2-3V RIGHT COMPARISON:  None. FINDINGS: There is a superiorly displaced fracture of the right femoral neck. No other pelvic fracture is identified. The femoral head remains situated in the acetabulum. Left femoral hardware where is normal. IMPRESSION: Superiorly displaced transverse fracture of the right femoral neck. Electronically Signed   By: Ulyses Jarred M.D.   On: 11/08/2017 00:12       Today   Subjective:   Naja Apperson feeling better denies any significant pain in the morning  Objective:   Blood pressure 124/64, pulse 92, temperature 99.6 F (37.6 C), temperature source Oral, resp. rate 14, height 5\' 5"  (1.651 m), weight 63.3 kg (139 lb 9.6 oz), SpO2 99 %.  .  Intake/Output Summary (Last 24 hours) at 11/12/2017 1019 Last data filed at 11/12/2017 0112 Gross per 24 hour  Intake 480 ml  Output 200 ml  Net 280 ml    Exam VITAL SIGNS: Blood pressure 124/64, pulse 92, temperature 99.6  F (37.6 C), temperature source Oral, resp. rate 14, height 5\' 5"  (1.651 m), weight 63.3 kg (139 lb 9.6 oz), SpO2 99 %.  GENERAL:  84 y.o.-year-old patient lying in the bed with no acute distress.  EYES: Pupils equal, round, reactive to light and accommodation. No scleral icterus. Extraocular muscles intact.  HEENT: Head atraumatic, normocephalic. Oropharynx and nasopharynx clear.  NECK:  Supple, no jugular venous distention. No thyroid enlargement, no  tenderness.  LUNGS: Normal breath sounds bilaterally, no wheezing, rales,rhonchi or crepitation. No use of accessory muscles of respiration.  CARDIOVASCULAR: S1, S2 normal. No murmurs, rubs, or gallops.  ABDOMEN: Soft, nontender, nondistended. Bowel sounds present. No organomegaly or mass.  EXTREMITIES: No pedal edema, cyanosis, or clubbing.  NEUROLOGIC: Cranial nerves II through XII are intact. Muscle strength 5/5 in all extremities. Sensation intact. Gait not checked.  PSYCHIATRIC: The patient is alert and oriented x 3.  SKIN: No obvious rash, lesion, or ulcer.   Data Review     CBC w Diff:  Lab Results  Component Value Date   WBC 8.7 11/12/2017   HGB 9.5 (L) 11/12/2017   HGB 10.4 (L) 11/14/2014   HCT 27.8 (L) 11/12/2017   HCT 31.1 (L) 11/14/2014   PLT 188 11/12/2017   PLT 151 11/14/2014   LYMPHOPCT 22 02/19/2017   LYMPHOPCT 16.3 11/14/2014   MONOPCT 11 02/19/2017   MONOPCT 8.2 11/14/2014   EOSPCT 4 02/19/2017   EOSPCT 6.2 11/14/2014   BASOPCT 1 02/19/2017   BASOPCT 0.9 11/14/2014   CMP:  Lab Results  Component Value Date   NA 136 11/11/2017   NA 138 11/12/2014   K 3.6 11/11/2017   K 4.0 11/12/2014   CL 101 11/11/2017   CL 108 11/12/2014   CO2 31 11/11/2017   CO2 26 11/12/2014   BUN 14 11/11/2017   BUN 15 11/12/2014   CREATININE 0.71 11/11/2017   CREATININE 0.65 11/12/2014   PROT 6.6 11/08/2017   ALBUMIN 3.7 11/08/2017   BILITOT 0.6 11/08/2017   ALKPHOS 95 11/08/2017   AST 21 11/08/2017   ALT 17 11/08/2017  .  Micro Results Recent Results (from the past 240 hour(s))  Surgical pcr screen     Status: None   Collection Time: 11/08/17  1:52 AM  Result Value Ref Range Status   MRSA, PCR NEGATIVE NEGATIVE Final   Staphylococcus aureus NEGATIVE NEGATIVE Final    Comment: (NOTE) The Xpert SA Assay (FDA approved for NASAL specimens in patients 29 years of age and older), is one component of a comprehensive surveillance program. It is not intended to diagnose  infection nor to guide or monitor treatment. Performed at [facility], [address] [place], [place] [address]         Code Status Orders  (From admission, onward)        Start     Ordered   11/08/17 0133  Full code  Continuous     11/08/17 0132    Code Status History    Date Active Date Inactive Code Status Order ID Comments User Context   02/19/2017 0413 02/20/2017 1453 Full Code [id number]  Harrie Foreman, MD ED   01/21/2017 1359 01/25/2017 2018 Full Code [id number]  Vaughan Basta, MD Inpatient    Advance Directive Documentation     Most Recent Value  Type of Advance Directive  Living will  Pre-existing out of facility DNR order (yellow form or pink MOST form)  -  "MOST" Form in Place?  -  Contact information for follow-up providers    Earnestine Leys, MD On 11/26/2017.   Specialty:  Orthopedic Surgery Why:  [address] Contact information: [place] [address] [phone]            Contact information for after-discharge care    Destination    [facility] .   Service:  Skilled Nursing Contact information: [address] [place] [phone]                  Discharge Medications   Allergies as of 11/12/2017      Reactions   Pollen Extract       Medication List    STOP taking these medications   aspirin 325 MG tablet     TAKE these medications   acetaminophen 325 MG tablet Commonly known as:  TYLENOL Take 2 tablets (650 mg total) by mouth every 6 (six) hours as needed for mild pain (or Fever >/= 101).   docusate sodium 100 MG capsule Commonly known as:  COLACE Take 1 capsule (100 mg total) by mouth 2 (two) times daily.   gabapentin 300 MG capsule Commonly known as:  NEURONTIN Take 1 capsule (300 mg total) by mouth 3 (three) times daily.   HYDROcodone-acetaminophen 5-325 MG tablet Commonly known as:  NORCO/VICODIN Take 1-2 tablets by  mouth every 4 (four) hours as needed for moderate pain (pain score 4-6).   rivaroxaban 10 MG Tabs tablet Commonly known as:  XARELTO Take 1 tablet (10 mg total) by mouth daily. Start taking on:  11/13/2017          Total Time in preparing paper work, data evaluation and todays exam - 54 minutes  Dustin Flock M.D on 11/12/2017 at 10:19 AM [place]  [phone]

## 2017-11-12 NOTE — Discharge Instructions (Signed)
Information on my medicine - XARELTO (Rivaroxaban)  This medication education was reviewed with me or my healthcare representative as part of my discharge preparation.  The pharmacist that spoke with me during my hospital stay was:  Ramond Dial, [facility]  Why was Xarelto prescribed for you? Xarelto was prescribed for you to reduce the risk of blood clots forming after orthopedic surgery. The medical term for these abnormal blood clots is venous thromboembolism (VTE).  What do you need to know about xarelto ? Take your Xarelto ONCE DAILY at the same time every day. You may take it either with or without food.  If you have difficulty swallowing the tablet whole, you may crush it and mix in applesauce just prior to taking your dose.  Take Xarelto exactly as prescribed by your doctor and DO NOT stop taking Xarelto without talking to the doctor who prescribed the medication.  Stopping without other VTE prevention medication to take the place of Xarelto may increase your risk of developing a clot.  After discharge, you should have regular check-up appointments with your healthcare provider that is prescribing your Xarelto.    What do you do if you miss a dose? If you miss a dose, take it as soon as you remember on the same day then continue your regularly scheduled once daily regimen the next day. Do not take two doses of Xarelto on the same day.   Important Safety Information A possible side effect of Xarelto is bleeding. You should call your healthcare provider right away if you experience any of the following: ? Bleeding from an injury or your nose that does not stop. ? Unusual colored urine (red or dark brown) or unusual colored stools (red or black). ? Unusual bruising for unknown reasons. ? A serious fall or if you hit your head (even if there is no bleeding).  Some medicines may interact with Xarelto and might increase your risk of bleeding while on Xarelto. To help avoid  this, consult your healthcare provider or pharmacist prior to using any new prescription or non-prescription medications, including herbals, vitamins, non-steroidal anti-inflammatory drugs (NSAIDs) and supplements.  This website has more information on Xarelto: [email or web address]

## 2017-11-12 NOTE — Progress Notes (Deleted)
[facility] - [place] at [facility], 84 y.o., DOB 1933/11/20, MRN [id number]. Admission date: 11/07/2017 Discharge Date 11/12/2017 Primary MD Burnard Hawthorne, FNP Admitting Physician Lance Coon, MD  Admission Diagnosis   Weakness  Discharge Diagnosis   Principal Problem:   Closed right hip fracture [facility])   History of DVT (deep vein thrombosis) Acute blood loss anemia due to surgery        Hospital Course  Patient is 84 year old female presented to the hospital after a fall.  Patient was noted to have a hip fracture.  She was seen by orthopedic surgery and underwent surgery.  She tolerated the procedure.  She had a lot of pain post procedure.  However pain is now improved.  The orthopedic surgeon Dr. Earnestine Leys  recommended xarelto 10mg  daily for  6 weeks post surgery due to her history of having DVT post procedure in the past.  She will also follow-up with him and 2 weeks for staple removal.   Xarelto please start tommorow 11/13/17   Patient currently requiring oxygen which will need to continue and weaned off slowly at the facility.  Recommend she continue use incentive spirometer.      Consults  orthopedic surgery  Significant Tests:  See full reports for all details    Dg Chest 1 View  Result Date: 11/08/2017 CLINICAL DATA:  Slip and fall injury with right hip pain. EXAM: CHEST  1 VIEW COMPARISON:  11/10/2014 FINDINGS: Shallow inspiration. Cardiac enlargement. No pulmonary vascular congestion. No airspace disease or consolidation in the lungs. No blunting of costophrenic angles. No pneumothorax. Calcification of the aorta. Degenerative changes in the spine. Old left rib fractures. Surgical clips in the right upper quadrant. IMPRESSION: No evidence of active pulmonary disease.  Aortic atherosclerosis. Electronically Signed   By: Lucienne Capers M.D.   On: 11/08/2017 00:13   Dg Hip Port Unilat With Pelvis 1v Right  Result Date:  11/09/2017 CLINICAL DATA:  Status post hip replacement EXAM: DG HIP (WITH OR WITHOUT PELVIS) 1V PORT RIGHT COMPARISON:  None. FINDINGS: The patient is status post right hip replacement. Acetabular and femoral components are in good position. A gamma nail and rod associated with the left proximal femur are unchanged since Dec 07, 2017. IMPRESSION: Status post right hip replacement as above. Electronically Signed   By: Dorise Bullion III M.D   On: 11/09/2017 11:33   Dg Hip Unilat W Or Wo Pelvis 2-3 Views Right  Result Date: 11/08/2017 CLINICAL DATA:  Fall EXAM: DG HIP (WITH OR WITHOUT PELVIS) 2-3V RIGHT COMPARISON:  None. FINDINGS: There is a superiorly displaced fracture of the right femoral neck. No other pelvic fracture is identified. The femoral head remains situated in the acetabulum. Left femoral hardware where is normal. IMPRESSION: Superiorly displaced transverse fracture of the right femoral neck. Electronically Signed   By: Ulyses Jarred M.D.   On: 11/08/2017 00:12       Today   Subjective:   Elizabeth Fernandez patient feeling better denies any significant pain this morning Objective:   Blood pressure 124/64, pulse 92, temperature 99.6 F (37.6 C), temperature source Oral, resp. rate 14, height 5\' 5"  (1.651 m), weight 63.3 kg (139 lb 9.6 oz), SpO2 99 %.  .  Intake/Output Summary (Last 24 hours) at 11/12/2017 0947 Last data filed at 11/12/2017 0112 Gross per 24 hour  Intake 480 ml  Output 200 ml  Net 280 ml    Exam VITAL SIGNS: Blood pressure  124/64, pulse 92, temperature 99.6 F (37.6 C), temperature source Oral, resp. rate 14, height 5\' 5"  (1.651 m), weight 63.3 kg (139 lb 9.6 oz), SpO2 99 %.  GENERAL:  84 y.o.-year-old patient lying in the bed with no acute distress.  EYES: Pupils equal, round, reactive to light and accommodation. No scleral icterus. Extraocular muscles intact.  HEENT: Head atraumatic, normocephalic. Oropharynx and nasopharynx clear.  NECK:  Supple, no jugular venous  distention. No thyroid enlargement, no tenderness.  LUNGS: Normal breath sounds bilaterally, no wheezing, rales,rhonchi or crepitation. No use of accessory muscles of respiration.  CARDIOVASCULAR: S1, S2 normal. No murmurs, rubs, or gallops.  ABDOMEN: Soft, nontender, nondistended. Bowel sounds present. No organomegaly or mass.  EXTREMITIES: No pedal edema, cyanosis, or clubbing.  NEUROLOGIC: Cranial nerves II through XII are intact. Muscle strength 5/5 in all extremities. Sensation intact. Gait not checked.  PSYCHIATRIC: The patient is alert and oriented x 3.  SKIN: No obvious rash, lesion, or ulcer.   Data Review     CBC w Diff:  Lab Results  Component Value Date   WBC 8.7 11/12/2017   HGB 9.5 (L) 11/12/2017   HGB 10.4 (L) 11/14/2014   HCT 27.8 (L) 11/12/2017   HCT 31.1 (L) 11/14/2014   PLT 188 11/12/2017   PLT 151 11/14/2014   LYMPHOPCT 22 02/19/2017   LYMPHOPCT 16.3 11/14/2014   MONOPCT 11 02/19/2017   MONOPCT 8.2 11/14/2014   EOSPCT 4 02/19/2017   EOSPCT 6.2 11/14/2014   BASOPCT 1 02/19/2017   BASOPCT 0.9 11/14/2014   CMP:  Lab Results  Component Value Date   NA 136 11/11/2017   NA 138 11/12/2014   K 3.6 11/11/2017   K 4.0 11/12/2014   CL 101 11/11/2017   CL 108 11/12/2014   CO2 31 11/11/2017   CO2 26 11/12/2014   BUN 14 11/11/2017   BUN 15 11/12/2014   CREATININE 0.71 11/11/2017   CREATININE 0.65 11/12/2014   PROT 6.6 11/08/2017   ALBUMIN 3.7 11/08/2017   BILITOT 0.6 11/08/2017   ALKPHOS 95 11/08/2017   AST 21 11/08/2017   ALT 17 11/08/2017  .  Micro Results Recent Results (from the past 240 hour(s))  Surgical pcr screen     Status: None   Collection Time: 11/08/17  1:52 AM  Result Value Ref Range Status   MRSA, PCR NEGATIVE NEGATIVE Final   Staphylococcus aureus NEGATIVE NEGATIVE Final    Comment: (NOTE) The Xpert SA Assay (FDA approved for NASAL specimens in patients 52 years of age and older), is one component of a comprehensive surveillance  program. It is not intended to diagnose infection nor to guide or monitor treatment. Performed at [facility], [address] [place], [place] [address]         Code Status Orders  (From admission, onward)        Start     Ordered   11/08/17 0133  Full code  Continuous     11/08/17 0132    Code Status History    Date Active Date Inactive Code Status Order ID Comments User Context   02/19/2017 0413 02/20/2017 1453 Full Code [id number]  Harrie Foreman, MD ED   01/21/2017 1359 01/25/2017 2018 Full Code [id number]  Vaughan Basta, MD Inpatient    Advance Directive Documentation     Most Recent Value  Type of Advance Directive  Living will  Pre-existing out of facility DNR order (yellow form or pink MOST form)  -  "  MOST" Form in Place?  -           Contact information for follow-up providers    Earnestine Leys, MD Follow up in 2 week(s).   Specialty:  Orthopedic Surgery Contact information: [place] [phone]            Contact information for after-discharge care    [facility] .   Service:  Skilled Nursing Contact information: [address] [place] [phone]                  Discharge Medications   Allergies as of 11/12/2017      Reactions   Pollen Extract       Medication List    STOP taking these medications   aspirin 325 MG tablet     TAKE these medications   acetaminophen 325 MG tablet Commonly known as:  TYLENOL Take 2 tablets (650 mg total) by mouth every 6 (six) hours as needed for mild pain (or Fever >/= 101).   docusate sodium 100 MG capsule Commonly known as:  COLACE Take 1 capsule (100 mg total) by mouth 2 (two) times daily.   gabapentin 300 MG capsule Commonly known as:  NEURONTIN Take 1 capsule (300 mg total) by mouth 3 (three) times daily.   HYDROcodone-acetaminophen 5-325 MG tablet Commonly known as:   NORCO/VICODIN Take 1-2 tablets by mouth every 4 (four) hours as needed for moderate pain (pain score 4-6).   rivaroxaban 10 MG Tabs tablet Commonly known as:  XARELTO Take 1 tablet (10 mg total) by mouth daily. Start taking on:  11/13/2017          Total Time in preparing paper work, data evaluation and todays exam - 66 minutes  Dustin Flock M.D on 11/12/2017 at 9:47 AM [place]  [phone]

## 2017-11-12 NOTE — Telephone Encounter (Signed)
Rx sent to [facility] phone : [phone] , fax : [phone]  

## 2017-11-12 NOTE — Progress Notes (Signed)
Patient is medically stable for D/C to [facility] today. Per [facility] admissions coordinator at [facility] patient can come today to room 208-A. RN will call report at [phone] and arrange EMS for transport. Clinical Education officer, museum (CSW) sent D/C orders to [employer] via [employer]. Patient is aware of above. Patient's son Lennette Bihari is at bedside and aware of above. Please reconsult if future social work needs arise. CSW signing off.   [employer], LCSW [phone]

## 2017-11-13 ENCOUNTER — Other Ambulatory Visit
Admission: RE | Admit: 2017-11-13 | Discharge: 2017-11-13 | Disposition: A | Discharge status: Home / Self Care | Payer: Medicare Other | Source: Ambulatory Visit | Attending: Internal Medicine | Admitting: Internal Medicine

## 2017-11-13 DIAGNOSIS — J439 Emphysema, unspecified: Secondary | ICD-10-CM | POA: Diagnosis not present

## 2017-11-13 DIAGNOSIS — M8000XA Age-related osteoporosis with current pathological fracture, unspecified site, initial encounter for fracture: Secondary | ICD-10-CM | POA: Diagnosis not present

## 2017-11-13 DIAGNOSIS — R39198 Other difficulties with micturition: Secondary | ICD-10-CM | POA: Diagnosis not present

## 2017-11-13 DIAGNOSIS — Z86718 Personal history of other venous thrombosis and embolism: Secondary | ICD-10-CM | POA: Diagnosis not present

## 2017-11-13 DIAGNOSIS — R350 Frequency of micturition: Secondary | ICD-10-CM | POA: Insufficient documentation

## 2017-11-13 LAB — URINALYSIS, COMPLETE (UACMP) WITH MICROSCOPIC
Bacteria, UA: NONE SEEN
Bilirubin Urine: NEGATIVE
Glucose, UA: NEGATIVE mg/dL
Hgb urine dipstick: NEGATIVE
KETONES UR: NEGATIVE mg/dL
Leukocytes, UA: NEGATIVE
Nitrite: NEGATIVE
PH: 7 (ref 5.0–8.0)
Protein, ur: NEGATIVE mg/dL
SPECIFIC GRAVITY, URINE: 1.015 (ref 1.005–1.030)

## 2017-11-13 LAB — SURGICAL PATHOLOGY

## 2017-11-15 LAB — URINE CULTURE: Culture: NO GROWTH

## 2017-11-26 DIAGNOSIS — S72031A Displaced midcervical fracture of right femur, initial encounter for closed fracture: Secondary | ICD-10-CM | POA: Diagnosis not present

## 2017-11-27 ENCOUNTER — Non-Acute Institutional Stay (SKILLED_NURSING_FACILITY): Payer: Medicare Other | Admitting: Gerontology

## 2017-11-27 ENCOUNTER — Encounter: Payer: Self-pay | Admitting: Gerontology

## 2017-11-27 DIAGNOSIS — Z96611 Presence of right artificial shoulder joint: Secondary | ICD-10-CM | POA: Diagnosis not present

## 2017-11-27 DIAGNOSIS — S72001D Fracture of unspecified part of neck of right femur, subsequent encounter for closed fracture with routine healing: Secondary | ICD-10-CM

## 2017-11-27 NOTE — Progress Notes (Signed)
Location:   [place] Room Number: [place] of Service:  SNF [phone]) Provider:  Toni Arthurs, NP-C  Arnett, Yvetta Coder, FNP  Patient Care Team: Burnard Hawthorne, FNP as PCP - General (Family Medicine)  Extended Emergency Contact Information Primary Emergency Contact: Shirlean Schlein [place] Mobile Phone: [phone] Relation: Son Secondary Emergency Contact: Arvilla Meres [place] Phone: [phone] Relation: Son  Code Status:  FULL Goals of care: Advanced Directive information Advanced Directives 11/27/2017  Does Patient Have a Medical Advance Directive? No  Type of Advance Directive -  Does patient want to make changes to medical advance directive? -  Copy of [place] in Chart? -  Would patient like information on creating a medical advance directive? -     Chief Complaint  Patient presents with  . Medical Management of Chronic Issues    Routine Visit    HPI:  Pt is a 84 y.o. female seen today for medical management of chronic diseases. Pt was admitted to the facility for rehab following mechanical fall at home. She sustained a Right Hip Fracture and underwent a right Hip Hemiarthroplasty. She has been participating in PT/OT. Pt reports her pain is well controlled on current regimen. Appetite is good, voiding well, having regular BMs. Incision well approximated. Steri's in place. Mild redness, no warmth, no drainage. Calves soft, supple. Negative Homan's sign. VSS. No other complaints.       Past Medical History:  Diagnosis Date  . Arthritis    both knees  . Cataract    Surgery scheduled for 03/2014   Past Surgical History:  Procedure Laterality Date  . CHOLECYSTECTOMY    . FEMUR SURGERY  11/10/2014  . HIP ARTHROPLASTY Right 11/09/2017   Procedure: ARTHROPLASTY BIPOLAR HIP (HEMIARTHROPLASTY);  Surgeon: Earnestine Leys, MD;  Location: [facility] ORS;  Service: Orthopedics;   Laterality: Right;  . IR FLUORO GUIDED NEEDLE PLC ASPIRATION/INJECTION LOC  01/25/2017    Allergies  Allergen Reactions  . Pollen Extract     Allergies as of 11/27/2017      Reactions   Pollen Extract       Medication List        Accurate as of 11/27/17  3:18 PM. Always use your most recent med list.          acetaminophen 325 MG tablet Commonly known as:  TYLENOL Take 2 tablets (650 mg total) by mouth every 6 (six) hours as needed for mild pain (or Fever >/= 101).   Cholecalciferol 4000 units Caps Take 1 capsule by mouth daily.   docusate sodium 100 MG capsule Commonly known as:  COLACE Take 1 capsule (100 mg total) by mouth 2 (two) times daily.   Fluticasone-Salmeterol 250-50 MCG/DOSE Aepb Commonly known as:  ADVAIR Inhale 1 puff into the lungs 2 (two) times daily.   gabapentin 300 MG capsule Commonly known as:  NEURONTIN Take 1 capsule (300 mg total) by mouth 3 (three) times daily.   HYDROcodone-acetaminophen 5-325 MG tablet Commonly known as:  NORCO/VICODIN Take 1-2 tablets by mouth every 4 (four) hours as needed for moderate pain or severe pain.   polyethylene glycol packet Commonly known as:  MIRALAX / GLYCOLAX Take 17 g by mouth daily.   rivaroxaban 10 MG Tabs tablet Commonly known as:  XARELTO Take 1 tablet (10 mg total) by mouth daily.   sennosides-docusate sodium 8.6-50 MG tablet Commonly known as:  SENOKOT-S Take 2 tablets by  mouth 2 (two) times daily.   SPIRIVA HANDIHALER 18 MCG inhalation capsule Generic drug:  tiotropium Place 18 mcg into inhaler and inhale daily.       Review of Systems  Constitutional: Negative for activity change, appetite change, chills, diaphoresis and fever.  HENT: Negative for congestion, mouth sores, nosebleeds, postnasal drip, sneezing, sore throat, trouble swallowing and voice change.   Respiratory: Negative for apnea, cough, choking, chest tightness, shortness of breath and wheezing.   Cardiovascular: Negative  for chest pain, palpitations and leg swelling.  Gastrointestinal: Negative for abdominal distention, abdominal pain, constipation, diarrhea and nausea.  Genitourinary: Negative for difficulty urinating, dysuria, frequency and urgency.  Musculoskeletal: Positive for arthralgias (typical arthritis) and gait problem. Negative for back pain and myalgias.  Skin: Positive for wound. Negative for color change, pallor and rash.  Neurological: Negative for dizziness, tremors, syncope, speech difficulty, weakness, numbness and headaches.  Psychiatric/Behavioral: Negative for agitation and behavioral problems.  All other systems reviewed and are negative.   Immunization History  Administered Date(s) Administered  . Influenza, High Dose Seasonal PF 06/19/2016  . Influenza,inj,Quad PF,6+ Mos 07/26/2015  . PPD Test 11/18/2014  . Pneumococcal Conjugate-13 10/24/2016   Pertinent  Health Maintenance Due  Topic Date Due  . PNA vac Low Risk Adult (2 of 2 - PPSV23) 10/24/2017  . INFLUENZA VACCINE  03/06/2018  . DEXA SCAN  Completed   Fall Risk  02/25/2017 02/18/2017 10/24/2016 10/24/2015 07/26/2015  Falls in the past year? No No No Yes Yes  Number falls in past yr: - - - 2 or more 2 or more  Injury with Fall? - - - Yes Yes  Risk for fall due to : - - - Other (Comment);History of fall(s) History of fall(s)  Risk for fall due to: Comment - - - Missed a step when going down stairs.  Broken L femur.  Stable and followed by PCP. -  Follow up - - - Falls prevention discussed;Education provided Falls prevention discussed   Functional Status Survey:    Vitals:   11/27/17 1459  BP: 119/75  Pulse: 89  Resp: 20  Temp: 98.4 F (36.9 C)  TempSrc: Oral  SpO2: 98%  Weight: 152 lb 8 oz (69.2 kg)  Height: 5\' 5"  (1.651 m)   Body mass index is 25.38 kg/m. Physical Exam  Constitutional: She is oriented to person, place, and time. Vital signs are normal. She appears well-developed and well-nourished. She is  active and cooperative. She does not appear ill. No distress.  HENT:  Head: Normocephalic and atraumatic.  Mouth/Throat: Uvula is midline, oropharynx is clear and moist and mucous membranes are normal. Mucous membranes are not pale, not dry and not cyanotic.  Eyes: Pupils are equal, round, and reactive to light. Conjunctivae, EOM and lids are normal.  Neck: Trachea normal, normal range of motion and full passive range of motion without pain. Neck supple. No JVD present. No tracheal deviation, no edema and no erythema present. No thyromegaly present.  Cardiovascular: Normal rate, regular rhythm, normal heart sounds, intact distal pulses and normal pulses. Exam reveals no gallop, no distant heart sounds and no friction rub.  No murmur heard. Pulses:      Dorsalis pedis pulses are 2+ on the right side, and 2+ on the left side.  No edema  Pulmonary/Chest: Effort normal and breath sounds normal. No accessory muscle usage. No respiratory distress. She has no decreased breath sounds. She has no wheezes. She has no rhonchi. She has no rales.  She exhibits no tenderness.  Abdominal: Soft. Normal appearance and bowel sounds are normal. She exhibits no distension and no ascites. There is no tenderness.  Musculoskeletal: She exhibits no edema or tenderness.       Right hip: She exhibits decreased range of motion, decreased strength and laceration.  Expected osteoarthritis, stiffness; Bilateral Calves soft, supple. Negative Homan's Sign. B- pedal pulses equal  Neurological: She is alert and oriented to person, place, and time. She has normal strength.  Skin: Skin is warm and dry. Laceration noted. She is not diaphoretic. No cyanosis. No pallor. Nails show no clubbing.  Psychiatric: She has a normal mood and affect. Her speech is normal and behavior is normal. Judgment and thought content normal. Cognition and memory are normal.  Nursing note and vitals reviewed.   Labs reviewed: Recent Labs     11/08/17 0009 11/09/17 1223 11/10/17 0410 11/11/17 0452  NA 144  --  133* 136  K 4.1  --  3.7 3.6  CL 108  --  103 101  CO2 30  --  25 31  GLUCOSE 117*  --  129* 97  BUN 22*  --  15 14  CREATININE 0.81 0.67 0.73 0.71  CALCIUM 8.6*  --  7.6* 7.9*   Recent Labs    12/25/16 0915 11/08/17 0009  AST 13 21  ALT 11 17  ALKPHOS 114 95  BILITOT 0.5 0.6  PROT 6.8 6.6  ALBUMIN 4.0 3.7   Recent Labs    12/25/16 0915  02/19/17 0134  11/10/17 0410 11/11/17 0452 11/12/17 0422  WBC 6.5   < > 5.4   < > 9.1 8.7 8.7  NEUTROABS 4.2  --  3.4  --   --   --   --   HGB 12.3   < > 13.0   < > 10.3* 10.3* 9.5*  HCT 36.6   < > 38.2   < > 30.9* 29.9* 27.8*  MCV 89.7   < > 90.4   < > 89.3 89.0 88.8  PLT 214.0   < > 172   < > 150 142* 188   < > = values in this interval not displayed.   Lab Results  Component Value Date   TSH 7.857 (H) 02/19/2017   No results found for: HGBA1C Lab Results  Component Value Date   CHOL 199 12/25/2016   HDL 71.40 12/25/2016   LDLCALC 117 (H) 12/25/2016   TRIG 54.0 12/25/2016   CHOLHDL 3 12/25/2016    Significant Diagnostic Results in last 30 days:  Dg Chest 1 View  Result Date: 11/08/2017 CLINICAL DATA:  Slip and fall injury with right hip pain. EXAM: CHEST  1 VIEW COMPARISON:  11/10/2014 FINDINGS: Shallow inspiration. Cardiac enlargement. No pulmonary vascular congestion. No airspace disease or consolidation in the lungs. No blunting of costophrenic angles. No pneumothorax. Calcification of the aorta. Degenerative changes in the spine. Old left rib fractures. Surgical clips in the right upper quadrant. IMPRESSION: No evidence of active pulmonary disease.  Aortic atherosclerosis. Electronically Signed   By: Lucienne Capers M.D.   On: 11/08/2017 00:13   Dg Hip Port Unilat With Pelvis 1v Right  Result Date: 11/09/2017 CLINICAL DATA:  Status post hip replacement EXAM: DG HIP (WITH OR WITHOUT PELVIS) 1V PORT RIGHT COMPARISON:  None. FINDINGS: The patient is  status post right hip replacement. Acetabular and femoral components are in good position. A gamma nail and rod associated with the left proximal femur are unchanged since  Dec 07, 2017. IMPRESSION: Status post right hip replacement as above. Electronically Signed   By: Dorise Bullion III M.D   On: 11/09/2017 11:33   Dg Hip Unilat W Or Wo Pelvis 2-3 Views Right  Result Date: 11/08/2017 CLINICAL DATA:  Fall EXAM: DG HIP (WITH OR WITHOUT PELVIS) 2-3V RIGHT COMPARISON:  None. FINDINGS: There is a superiorly displaced fracture of the right femoral neck. No other pelvic fracture is identified. The femoral head remains situated in the acetabulum. Left femoral hardware where is normal. IMPRESSION: Superiorly displaced transverse fracture of the right femoral neck. Electronically Signed   By: Ulyses Jarred M.D.   On: 11/08/2017 00:12    Assessment/Plan Closed fracture of right hip with routine healing, subsequent encounter S/P shoulder hemiarthroplasty, right  Continue PT/OT  Continue exercises as taught by Pt/OT  Continue Xarelto 10 mg po daily for DVT prophylaxis  Continue ice pack prn for pain, edema  Continue Norco 5/325 mg 1-2 tablets po Q 4 hours prn pain  Skin care per protocol  Follow up with Orthopedist as instructed  Family/ staff Communication:   Total Time:  Documentation:  Face to Face:  Family/Phone:   Labs/tests ordered:    Medication list reviewed and assessed for continued appropriateness. Monthly medication orders reviewed and signed.  Vikki Ports, NP-C Geriatrics [facility] [phone] [place], [place] [address] Cell Phone (Mon-Fri 8am-5pm):  [phone] On Call:  [phone] & follow prompts after 5pm & weekends Office Phone:  [phone] Office Fax:  [phone]

## 2017-11-29 DIAGNOSIS — Z96611 Presence of right artificial shoulder joint: Secondary | ICD-10-CM | POA: Insufficient documentation

## 2017-12-03 ENCOUNTER — Encounter (INDEPENDENT_AMBULATORY_CARE_PROVIDER_SITE_OTHER): Payer: Medicare Other | Admitting: Ophthalmology

## 2017-12-06 ENCOUNTER — Encounter (INDEPENDENT_AMBULATORY_CARE_PROVIDER_SITE_OTHER): Payer: Medicare Other | Admitting: Ophthalmology

## 2017-12-20 ENCOUNTER — Encounter (INDEPENDENT_AMBULATORY_CARE_PROVIDER_SITE_OTHER): Payer: Medicare Other | Admitting: Ophthalmology

## 2017-12-20 DIAGNOSIS — H43813 Vitreous degeneration, bilateral: Secondary | ICD-10-CM

## 2017-12-20 DIAGNOSIS — H35033 Hypertensive retinopathy, bilateral: Secondary | ICD-10-CM

## 2017-12-20 DIAGNOSIS — I10 Essential (primary) hypertension: Secondary | ICD-10-CM | POA: Diagnosis not present

## 2017-12-20 DIAGNOSIS — H34811 Central retinal vein occlusion, right eye, with macular edema: Secondary | ICD-10-CM

## 2017-12-24 DIAGNOSIS — S72031D Displaced midcervical fracture of right femur, subsequent encounter for closed fracture with routine healing: Secondary | ICD-10-CM | POA: Diagnosis not present

## 2018-01-02 DIAGNOSIS — M5416 Radiculopathy, lumbar region: Secondary | ICD-10-CM | POA: Diagnosis not present

## 2018-01-02 DIAGNOSIS — M5136 Other intervertebral disc degeneration, lumbar region: Secondary | ICD-10-CM | POA: Diagnosis not present

## 2018-01-02 DIAGNOSIS — M48062 Spinal stenosis, lumbar region with neurogenic claudication: Secondary | ICD-10-CM | POA: Diagnosis not present

## 2018-01-06 DIAGNOSIS — R262 Difficulty in walking, not elsewhere classified: Secondary | ICD-10-CM | POA: Diagnosis not present

## 2018-01-06 DIAGNOSIS — M25551 Pain in right hip: Secondary | ICD-10-CM | POA: Diagnosis not present

## 2018-01-06 DIAGNOSIS — M6281 Muscle weakness (generalized): Secondary | ICD-10-CM | POA: Diagnosis not present

## 2018-02-13 ENCOUNTER — Encounter (INDEPENDENT_AMBULATORY_CARE_PROVIDER_SITE_OTHER): Payer: Medicare Other | Admitting: Ophthalmology

## 2018-02-13 DIAGNOSIS — H34811 Central retinal vein occlusion, right eye, with macular edema: Secondary | ICD-10-CM

## 2018-02-13 DIAGNOSIS — H35033 Hypertensive retinopathy, bilateral: Secondary | ICD-10-CM

## 2018-02-13 DIAGNOSIS — I10 Essential (primary) hypertension: Secondary | ICD-10-CM

## 2018-02-13 DIAGNOSIS — H43813 Vitreous degeneration, bilateral: Secondary | ICD-10-CM

## 2018-02-28 ENCOUNTER — Telehealth: Payer: Self-pay

## 2018-02-28 NOTE — Telephone Encounter (Signed)
Spoke with patient scheduled appointment for 03/02/18 @100pm  . If pain gets worse will go to urgent care.

## 2018-02-28 NOTE — Telephone Encounter (Signed)
Copied from [place] [id number]. Topic: General - Other >> Feb 28, 2018 10:47 AM Yvette Rack wrote: Reason for CRM: Pt states she would like to schedule an appt for the Medicare Well Visit. Pt also stated she is experiencing lots of pain in her right knee and would like a Rx for pain relief. Pt request call back. Cb# [phone]  Called patient and she states that she is having right knee pain. She states that it hurts more with weight baring, it is swollen, but not warm to the touch, or red, and no brusing. No complaints of radiating pain either. She has been elevating her knee and taking tylenol with some relief. Patient is wanting something for pain.

## 2018-03-03 ENCOUNTER — Ambulatory Visit: Payer: Medicare Other | Admitting: Family

## 2018-03-04 DIAGNOSIS — M5136 Other intervertebral disc degeneration, lumbar region: Secondary | ICD-10-CM | POA: Diagnosis not present

## 2018-03-04 DIAGNOSIS — M48062 Spinal stenosis, lumbar region with neurogenic claudication: Secondary | ICD-10-CM | POA: Diagnosis not present

## 2018-03-04 DIAGNOSIS — M5416 Radiculopathy, lumbar region: Secondary | ICD-10-CM | POA: Diagnosis not present

## 2018-03-27 ENCOUNTER — Encounter (INDEPENDENT_AMBULATORY_CARE_PROVIDER_SITE_OTHER): Payer: Medicare Other | Admitting: Ophthalmology

## 2018-03-27 DIAGNOSIS — H35033 Hypertensive retinopathy, bilateral: Secondary | ICD-10-CM | POA: Diagnosis not present

## 2018-03-27 DIAGNOSIS — I10 Essential (primary) hypertension: Secondary | ICD-10-CM | POA: Diagnosis not present

## 2018-03-27 DIAGNOSIS — H43813 Vitreous degeneration, bilateral: Secondary | ICD-10-CM | POA: Diagnosis not present

## 2018-03-27 DIAGNOSIS — H34811 Central retinal vein occlusion, right eye, with macular edema: Secondary | ICD-10-CM

## 2018-05-09 ENCOUNTER — Ambulatory Visit (INDEPENDENT_AMBULATORY_CARE_PROVIDER_SITE_OTHER): Payer: Medicare Other

## 2018-05-09 VITALS — BP 128/70 | HR 69 | Temp 98.1°F | Resp 15 | Ht 63.0 in | Wt 149.1 lb

## 2018-05-09 DIAGNOSIS — Z0000 Encounter for general adult medical examination without abnormal findings: Secondary | ICD-10-CM | POA: Diagnosis not present

## 2018-05-09 DIAGNOSIS — Z23 Encounter for immunization: Secondary | ICD-10-CM | POA: Diagnosis not present

## 2018-05-09 NOTE — Patient Instructions (Addendum)
  Elizabeth Fernandez , Thank you for taking time to come for your Medicare Wellness Visit. I appreciate your ongoing commitment to your health goals. Please review the following plan we discussed and let me know if I can assist you in the future.   Follow up as needed.    Bring a copy of your [place] and/or Living Will to be scanned into chart.  Have a great day! These are the goals we discussed: Goals    . Maintain Healthy Lifestyle     Healthy diet Stay active Stay hydrated       This is a list of the screening recommended for you and due dates:  Health Maintenance  Topic Date Due  . Tetanus Vaccine  09/08/1952  . Pneumonia vaccines (2 of 2 - PPSV23) 10/24/2017  . Flu Shot  Completed  . DEXA scan (bone density measurement)  Completed

## 2018-05-09 NOTE — Progress Notes (Signed)
Subjective:   Elizabeth Fernandez is a 84 y.o. female who presents for Medicare Annual (Subsequent) preventive examination.  Review of Systems:  No ROS.  Medicare Wellness Visit. Additional risk factors are reflected in the social history. Cardiac Risk Factors include: advanced age (>4men, >17 women)     Objective:     Vitals: BP 128/70 (BP Location: Left Arm, Patient Position: Sitting, Cuff Size: Normal)   Pulse 69   Temp 98.1 F (36.7 C) (Oral)   Resp 15   Ht 5\' 3"  (1.6 m)   Wt 149 lb 1.9 oz (67.6 kg)   SpO2 95%   BMI 26.42 kg/m   Body mass index is 26.42 kg/m.  Advanced Directives 05/09/2018 11/27/2017 11/08/2017 02/19/2017 02/18/2017 01/21/2017 10/24/2016  Does Patient Have a Medical Advance Directive? Yes No Yes No No No Yes  Type of Paramedic of [place];Living will - Living will - - - [place]  Does patient want to make changes to medical advance directive? No - Patient declined - No - Patient declined - - - No - Patient declined  Copy of [place] in Chart? No - copy requested - - - - - No - copy requested  Would patient like information on creating a medical advance directive? - - No - Patient declined No - Patient declined No - Patient declined No - Patient declined -    Tobacco Social History   Tobacco Use  Smoking Status Never Smoker  Smokeless Tobacco Never Used     Counseling given: Not Answered   Clinical Intake:  Pre-visit preparation completed: Yes  Pain : 0-10 Pain Type: Chronic pain Pain Location: Knee Pain Orientation: Right Pain Descriptors / Indicators: Discomfort Pain Onset: More than a month ago Pain Relieving Factors: rest between activities.   Effect of Pain on Daily Activities: ibuprofen.  injection to the site years ago. declines follow up appointment for this issue.   Pain Relieving Factors: rest between activities.    Nutritional Status: BMI 25 -29 Overweight Diabetes:  No  How often do you need to have someone help you when you read instructions, pamphlets, or other written materials from your doctor or pharmacy?: 1 - Never  Interpreter Needed?: No     Past Medical History:  Diagnosis Date  . Arthritis    both knees  . Cataract    Surgery scheduled for 03/2014   Past Surgical History:  Procedure Laterality Date  . CHOLECYSTECTOMY    . FEMUR SURGERY  11/10/2014  . HIP ARTHROPLASTY Right 11/09/2017   Procedure: ARTHROPLASTY BIPOLAR HIP (HEMIARTHROPLASTY);  Surgeon: Earnestine Leys, MD;  Location: [facility] ORS;  Service: Orthopedics;  Laterality: Right;  . IR FLUORO GUIDED NEEDLE PLC ASPIRATION/INJECTION LOC  01/25/2017   Family History  Problem Relation Age of Onset  . Cancer Mother   . Heart disease Father 6       MI   Social History   Socioeconomic History  . Marital status: Widowed    Spouse name: Not on file  . Number of children: Not on file  . Years of education: Not on file  . Highest education level: Not on file  Occupational History  . Not on file  Social Needs  . Financial resource strain: Not hard at all  . Food insecurity:    Worry: Never true    Inability: Never true  . Transportation needs:    Medical: No    Non-medical: No  Tobacco  Use  . Smoking status: Never Smoker  . Smokeless tobacco: Never Used  Substance and Sexual Activity  . Alcohol use: No    Alcohol/week: 0.0 standard drinks  . Drug use: No  . Sexual activity: Not Currently  Lifestyle  . Physical activity:    Days per week: Not on file    Minutes per session: Not on file  . Stress: Not at all  Relationships  . Social connections:    Talks on phone: Not on file    Gets together: Not on file    Attends religious service: Not on file    Active member of club or organization: Not on file    Attends meetings of clubs or organizations: Not on file    Relationship status: Not on file  Other Topics Concern  . 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    Lives with son    Children- 2    Grandchildren- none    Pets: 1 dog inside    Enjoys- Reading and mowing yard     Outpatient Encounter Medications as of 05/09/2018  Medication Sig  . acetaminophen (TYLENOL) 325 MG tablet Take 2 tablets (650 mg total) by mouth every 6 (six) hours as needed for mild pain (or Fever >/= 101).  . Cholecalciferol 4000 units CAPS Take 1 capsule by mouth daily.  Marland Kitchen docusate sodium (COLACE) 100 MG capsule Take 1 capsule (100 mg total) by mouth 2 (two) times daily.  . Fluticasone-Salmeterol (ADVAIR) 250-50 MCG/DOSE AEPB Inhale 1 puff into the lungs 2 (two) times daily.  Marland Kitchen gabapentin (NEURONTIN) 300 MG capsule Take 1 capsule (300 mg total) by mouth 3 (three) times daily.  Marland Kitchen HYDROcodone-acetaminophen (NORCO/VICODIN) 5-325 MG tablet Take 1-2 tablets by mouth every 4 (four) hours as needed for moderate pain or severe pain.  . polyethylene glycol (MIRALAX / GLYCOLAX) packet Take 17 g by mouth daily.  . rivaroxaban (XARELTO) 10 MG TABS tablet Take 1 tablet (10 mg total) by mouth daily.  . sennosides-docusate sodium (SENOKOT-S) 8.6-50 MG tablet Take 2 tablets by mouth 2 (two) times daily.  Marland Kitchen tiotropium (SPIRIVA HANDIHALER) 18 MCG inhalation capsule Place 18 mcg into inhaler and inhale daily.   No facility-administered encounter medications on file as of 05/09/2018.     Activities of Daily Living In your present state of health, do you have any difficulty performing the following activities: 05/09/2018 11/08/2017  Hearing? N N  Vision? N N  Difficulty concentrating or making decisions? N N  Walking or climbing stairs? Y N  Comment Unsteady gait. Cane in use.  -  Dressing or bathing? N N  Doing errands, shopping? N N  Preparing Food and eating ? N -  Using the Toilet? N -  In the past six months, have you accidently leaked urine? Y -  Comment Managed with a daily brief -  Do you have problems with loss of bowel  control? N -  Managing your Medications? N -  Managing your Finances? N -  Housekeeping or managing your Housekeeping? N -  Some recent data might be hidden    Patient Care Team: Burnard Hawthorne, FNP as PCP - General (Family Medicine)    Assessment:   This is a routine wellness examination for Natalie.  The goal of the wellness visit is to assist the patient how to close the gaps in care and create a preventative care plan  for the patient.   The roster of all physicians providing medical care to patient is listed in the Snapshot section of the chart.  Taking calcium VIT D as appropriate/Osteoporosis  reviewed.    Safety issues reviewed; Lives with son. Smoke and carbon monoxide detectors in the home. No firearms in the home. Wears seatbelts when driving or riding with others. No violence in the home.  They do not have excessive sun exposure.  Discussed the need for sun protection: hats, long sleeves and the use of sunscreen if there is significant sun exposure.  Displays appropriate judgement.  No new identified risk were noted.  No failures at ADL's or IADL's.  Ambulates with cane.   BMI- discussed the importance of a healthy diet, water intake and the benefits of aerobic exercise. Educational material provided.   24 hour diet recall: Healthy diet  Dental- dentures.  Sleep patterns- sleeps fair at night. Naps during the day as needed.   High dose influenza vaccine administered L deltoid, tolerated well. Educational material provided.  TDAP and PNA vaccine discussed.   Patient Concerns: None at this time. Follow up with PCP as needed.  Exercise Activities and Dietary recommendations Current Exercise Habits: Home exercise routine, Type of exercise: walking, Time (Minutes): 30, Frequency (Times/Week): 4, Weekly Exercise (Minutes/Week): 120, Intensity: Mild  Goals    . Maintain Healthy Lifestyle     Healthy diet Stay active Stay hydrated       Fall Risk Fall  Risk  05/09/2018 02/25/2017 02/18/2017 10/24/2016 10/24/2015  Falls in the past year? No No No No Yes  Number falls in past yr: - - - - 2 or more  Injury with Fall? - - - - Yes  Risk for fall due to : - - - - Other (Comment);History of fall(s)  Risk for fall due to: Comment - - - - Missed a step when going down stairs.  Broken L femur.  Stable and followed by PCP.  Follow up - - - - Falls prevention discussed;Education provided   Depression Screen PHQ 2/9 Scores 05/09/2018 02/25/2017 02/18/2017 10/24/2016  PHQ - 2 Score 0 0 0 0     Cognitive Function MMSE - Mini Mental State Exam 10/24/2016 10/24/2015  Orientation to time 5 5  Orientation to Place 5 5  Registration 3 3  Attention/ Calculation 5 5  Recall 3 3  Language- name 2 objects 2 2  Language- repeat 1 1  Language- follow 3 step command 3 3  Language- read & follow direction 1 1  Write a sentence 1 1  Copy design 1 1  Total score 30 30     6CIT Screen 05/09/2018  What Year? 0 points  What month? 0 points  What time? 0 points  Count back from 20 0 points  Months in reverse 0 points  Repeat phrase 0 points  Total Score 0    Immunization History  Administered Date(s) Administered  . Influenza, High Dose Seasonal PF 06/19/2016, 05/09/2018  . Influenza,inj,Quad PF,6+ Mos 07/26/2015  . PPD Test 11/18/2014  . Pneumococcal Conjugate-13 10/24/2016   Screening Tests Health Maintenance  Topic Date Due  . TETANUS/TDAP  09/08/1952  . PNA vac Low Risk Adult (2 of 2 - PPSV23) 10/24/2017  . INFLUENZA VACCINE  Completed  . DEXA SCAN  Completed      Plan:    End of life planning; Advance aging; Advanced directives discussed. Copy of current HCPOA/Living Will requested.    I have personally  reviewed and noted the following in the patient's chart:   . Medical and social history . Use of alcohol, tobacco or illicit drugs  . Current medications and supplements . Functional ability and status . Nutritional status . Physical  activity . Advanced directives . List of other physicians . Hospitalizations, surgeries, and ER visits in previous 12 months . Vitals . Screenings to include cognitive, depression, and falls . Referrals and appointments  In addition, I have reviewed and discussed with patient certain preventive protocols, quality metrics, and best practice recommendations. A written personalized care plan for preventive services as well as general preventive health recommendations were provided to patient.     Varney Biles, LPN  [id number]/11/[id number]   Agree with plan. Mable Paris, NP

## 2018-05-20 DIAGNOSIS — M5136 Other intervertebral disc degeneration, lumbar region: Secondary | ICD-10-CM | POA: Diagnosis not present

## 2018-05-20 DIAGNOSIS — M48062 Spinal stenosis, lumbar region with neurogenic claudication: Secondary | ICD-10-CM | POA: Diagnosis not present

## 2018-05-20 DIAGNOSIS — M5416 Radiculopathy, lumbar region: Secondary | ICD-10-CM | POA: Diagnosis not present

## 2018-05-22 ENCOUNTER — Encounter (INDEPENDENT_AMBULATORY_CARE_PROVIDER_SITE_OTHER): Payer: Medicare Other | Admitting: Ophthalmology

## 2018-05-22 DIAGNOSIS — I10 Essential (primary) hypertension: Secondary | ICD-10-CM

## 2018-05-22 DIAGNOSIS — H348112 Central retinal vein occlusion, right eye, stable: Secondary | ICD-10-CM | POA: Diagnosis not present

## 2018-05-22 DIAGNOSIS — H43813 Vitreous degeneration, bilateral: Secondary | ICD-10-CM

## 2018-05-22 DIAGNOSIS — H35033 Hypertensive retinopathy, bilateral: Secondary | ICD-10-CM

## 2018-07-17 ENCOUNTER — Encounter (INDEPENDENT_AMBULATORY_CARE_PROVIDER_SITE_OTHER): Payer: Medicare Other | Admitting: Ophthalmology

## 2018-07-17 DIAGNOSIS — H34811 Central retinal vein occlusion, right eye, with macular edema: Secondary | ICD-10-CM

## 2018-07-17 DIAGNOSIS — H43813 Vitreous degeneration, bilateral: Secondary | ICD-10-CM | POA: Diagnosis not present

## 2018-07-17 DIAGNOSIS — I10 Essential (primary) hypertension: Secondary | ICD-10-CM | POA: Diagnosis not present

## 2018-07-17 DIAGNOSIS — H35033 Hypertensive retinopathy, bilateral: Secondary | ICD-10-CM

## 2018-08-27 DIAGNOSIS — M48062 Spinal stenosis, lumbar region with neurogenic claudication: Secondary | ICD-10-CM | POA: Diagnosis not present

## 2018-08-27 DIAGNOSIS — M5136 Other intervertebral disc degeneration, lumbar region: Secondary | ICD-10-CM | POA: Diagnosis not present

## 2018-08-27 DIAGNOSIS — M5416 Radiculopathy, lumbar region: Secondary | ICD-10-CM | POA: Diagnosis not present

## 2018-09-16 ENCOUNTER — Other Ambulatory Visit: Payer: Self-pay

## 2018-09-16 MED ORDER — FLUTICASONE-SALMETEROL 250-50 MCG/DOSE IN AEPB: 1.0000 | INHALATION_SPRAY | Freq: Two times a day (BID) | RESPIRATORY_TRACT | 3 refills | Status: DC

## 2018-09-24 ENCOUNTER — Encounter (INDEPENDENT_AMBULATORY_CARE_PROVIDER_SITE_OTHER): Payer: Medicare Other | Admitting: Ophthalmology

## 2018-09-24 DIAGNOSIS — I10 Essential (primary) hypertension: Secondary | ICD-10-CM | POA: Diagnosis not present

## 2018-09-24 DIAGNOSIS — H43813 Vitreous degeneration, bilateral: Secondary | ICD-10-CM

## 2018-09-24 DIAGNOSIS — H35033 Hypertensive retinopathy, bilateral: Secondary | ICD-10-CM

## 2018-09-24 DIAGNOSIS — H34811 Central retinal vein occlusion, right eye, with macular edema: Secondary | ICD-10-CM

## 2018-11-17 ENCOUNTER — Encounter (INDEPENDENT_AMBULATORY_CARE_PROVIDER_SITE_OTHER): Payer: Medicare Other | Admitting: Ophthalmology

## 2018-12-22 ENCOUNTER — Other Ambulatory Visit: Payer: Self-pay

## 2018-12-22 ENCOUNTER — Encounter (INDEPENDENT_AMBULATORY_CARE_PROVIDER_SITE_OTHER): Payer: Medicare Other | Admitting: Ophthalmology

## 2018-12-22 DIAGNOSIS — H35033 Hypertensive retinopathy, bilateral: Secondary | ICD-10-CM | POA: Diagnosis not present

## 2018-12-22 DIAGNOSIS — H43813 Vitreous degeneration, bilateral: Secondary | ICD-10-CM

## 2018-12-22 DIAGNOSIS — I10 Essential (primary) hypertension: Secondary | ICD-10-CM | POA: Diagnosis not present

## 2018-12-22 DIAGNOSIS — H34811 Central retinal vein occlusion, right eye, with macular edema: Secondary | ICD-10-CM | POA: Diagnosis not present

## 2019-02-09 ENCOUNTER — Other Ambulatory Visit: Payer: Self-pay

## 2019-02-09 ENCOUNTER — Encounter (INDEPENDENT_AMBULATORY_CARE_PROVIDER_SITE_OTHER): Payer: Medicare Other | Admitting: Ophthalmology

## 2019-02-09 DIAGNOSIS — H35033 Hypertensive retinopathy, bilateral: Secondary | ICD-10-CM

## 2019-02-09 DIAGNOSIS — I10 Essential (primary) hypertension: Secondary | ICD-10-CM | POA: Diagnosis not present

## 2019-02-09 DIAGNOSIS — H34811 Central retinal vein occlusion, right eye, with macular edema: Secondary | ICD-10-CM | POA: Diagnosis not present

## 2019-02-09 DIAGNOSIS — H43813 Vitreous degeneration, bilateral: Secondary | ICD-10-CM | POA: Diagnosis not present

## 2019-04-06 ENCOUNTER — Encounter (INDEPENDENT_AMBULATORY_CARE_PROVIDER_SITE_OTHER): Payer: Medicare Other | Admitting: Ophthalmology

## 2019-04-06 ENCOUNTER — Other Ambulatory Visit: Payer: Self-pay

## 2019-04-06 DIAGNOSIS — H35033 Hypertensive retinopathy, bilateral: Secondary | ICD-10-CM

## 2019-04-06 DIAGNOSIS — H34811 Central retinal vein occlusion, right eye, with macular edema: Secondary | ICD-10-CM | POA: Diagnosis not present

## 2019-04-06 DIAGNOSIS — I10 Essential (primary) hypertension: Secondary | ICD-10-CM | POA: Diagnosis not present

## 2019-04-06 DIAGNOSIS — H43813 Vitreous degeneration, bilateral: Secondary | ICD-10-CM | POA: Diagnosis not present

## 2019-04-20 ENCOUNTER — Other Ambulatory Visit: Payer: Self-pay

## 2019-04-20 ENCOUNTER — Ambulatory Visit (INDEPENDENT_AMBULATORY_CARE_PROVIDER_SITE_OTHER): Payer: Medicare Other | Admitting: Family

## 2019-04-20 ENCOUNTER — Encounter: Payer: Self-pay | Admitting: Family

## 2019-04-20 DIAGNOSIS — M7989 Other specified soft tissue disorders: Secondary | ICD-10-CM | POA: Diagnosis not present

## 2019-04-20 DIAGNOSIS — M25551 Pain in right hip: Secondary | ICD-10-CM

## 2019-04-20 DIAGNOSIS — M25552 Pain in left hip: Secondary | ICD-10-CM | POA: Diagnosis not present

## 2019-04-20 DIAGNOSIS — S72001D Fracture of unspecified part of neck of right femur, subsequent encounter for closed fracture with routine healing: Secondary | ICD-10-CM

## 2019-04-20 MED ORDER — MELOXICAM 7.5 MG PO TABS: 7.5000 mg | ORAL_TABLET | Freq: Every day | ORAL | 1 refills | Status: DC | PRN

## 2019-04-20 NOTE — Patient Instructions (Addendum)
We will be calling  You to schedule an IN PERSON follow up for labs, blood pressure , etc in the next 2 months with a colleague of mine while [employer] on maternity. If you do not get a call, please call our office to schedule at [phone].   I will send in meloxicam to be used as needed for moderate or severe hip pain.  You may still use Tylenol or ibuprofen however do not use them when using meloxicam. Let me know if you would like a referral to orthopedics at anytime for hip pain or if hip pain worsens.  A couple of points in regards to meloxicam ( Mobic) :  This medication is not intended for daily , long term use. It is a potent anti inflammatory ( NSAID), and my intention is for you take as needed for moderate to severe pain. If you find yourself using daily, please let me know.   Please takes Mobic ( meloxicam) with FOOD since it is an anti-inflammatory as it can cause a GI bleed or ulcer. If you have a history of GI bleed or ulcer, please do NOT take.  Do no take over the counter aleve, motrin, advil, goody's powder for pain as they are also NSAIDs, and they are  in the same class as Mobic  Lastly, we will need to monitor kidney function while on Mobic wer, and if we were to see any decline in kidney function in the future, we would have to discontinue this medication.    Today we discussed referrals, orders. Ultrasound of your  legs  I have placed these orders in the system for you.  Please be sure to give [place] a call if you have not heard from our office regarding this. We should hear from [place] within ONE week with information regarding your appointment. If not, please let me know immediately.

## 2019-04-20 NOTE — Assessment & Plan Note (Signed)
Chronic. H/o apparent provoked left DVT after fall, subsequent right hip fracture/surgery while in rehab.  Unsure if patient was actually on xarelto until left DVT resolved as she was lost to follow up; she also thinks she may have just taken herself off the medication.   last [place] 03/2018 showed persistent left DVT. Advised patient that this could put her at risk for CVA, MI. Advised stat repeat BL ultrasound, she declines stat as left swelling is unchanged.  Based on results, will discuss with patient and perhaps hematology if any reason for her to resume xarelto.

## 2019-04-20 NOTE — Assessment & Plan Note (Addendum)
Chronic. Doesn't appear particularly bothersome at this time. Declines xray, particularly of left hip since no recent films. She declines PT, re-referral to Dr Sabra Heck or Dr Sharlet Salina at this time.  Would like mobic prn to have on hand. Education provided on how to use and to use sparingly. She will let me know if hip pain worsens.  Advised in person f/u in 2 months while im on maternity leave for labs, f/u.

## 2019-04-20 NOTE — Progress Notes (Signed)
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   Works in yard and 'keeping active'.  Can feel pain when bending over, feels pain in right hip more so than left.   Unable to be pick up right leg and cross it over left due to pain.  . Pain never returned/improved to baseline since right hip surgery in 2019. Pain on lateral side of hips. NO numbness, groin pain, trouble urinating, trouble having bowel movements. NO h/o cancer.   No recent falls. Balance 'not like it used to be.' Using cane , doesn't use walker.   Pain 'Feels bearable. '   right hip surgery with Dr Sabra Heck; fall 11/2017. Had PT at that time with no real improvement.   Prn ibuprofen and tylenol without much improvement. Has been on mobic in the past.   Son with lives with her. She is Driving.   Cough and sob resolved from 2018.   Dr Sharlet Salina- has had injections in the past. Doesn't want another one at this time.    History, background, results pertinent:   H/o left DVT 2018; appears provoked.  No longer on xarelto and not sure who or when she stopped.  03/2017 showed persistent DVT in left. No DVT in right.   States will still have 'some swelling' in left swelling which has been chronic for years, more so than the right side. This is unchanged.   Lung nodule 03/2017 - Dr Rogelia Mire, PET scan no evidence of cancer and no further scanning needed. No longer having cough, sob.   No CKD.   Due pneumococcal 23, flu Due mammogram,dexa.    A/P/next steps:  Problem List Items Addressed This Visit      Other   Left leg swelling    Chronic. H/o apparent provoked left DVT after fall, subsequent right hip fracture/surgery while in rehab.  Unsure if patient was actually on xarelto  until left DVT resolved as she was lost to follow up; she also thinks she may have just taken herself off the medication.   last [place] 03/2018 showed persistent left DVT. Advised patient that this could put her at risk for CVA, MI. Advised stat repeat BL ultrasound, she declines stat as left swelling is unchanged.  Based on results, will discuss with patient and perhaps hematology if any reason for her to resume xarelto.        Relevant Orders   US Venous Img Lower Bilateral   Bilateral hip pain - Primary    Chronic. Doesn't appear particularly bothersome at this time. Declines xray, particularly of left hip since no recent films. She declines PT, re-referral to Dr Sabra Heck or Dr Sharlet Salina at this time.  Would like mobic prn to have on hand. Education provided on how to use and to use sparingly. She will let me know if hip pain worsens.  Advised in person f/u in 2 months while im on maternity leave for labs, f/u.      Relevant Medications   meloxicam (MOBIC) 7.5 MG tablet     Of note: she consents to pneumococcal 23 vaccine with influenza vaccine; she DECLINES DEXA, Mammogram at this time.   I spent 25  min  discussing plan of care over the phone.

## 2019-04-20 NOTE — Progress Notes (Signed)
Printed and mailed

## 2019-04-29 ENCOUNTER — Telehealth: Payer: Self-pay | Admitting: Internal Medicine

## 2019-04-29 NOTE — Telephone Encounter (Signed)
Call patient margaret wanted to schedule [place] b/l legs with h/o DVT  Lets get this scheduled please  [place]

## 2019-04-30 ENCOUNTER — Other Ambulatory Visit: Payer: Self-pay

## 2019-04-30 ENCOUNTER — Ambulatory Visit (INDEPENDENT_AMBULATORY_CARE_PROVIDER_SITE_OTHER): Payer: Medicare Other

## 2019-04-30 DIAGNOSIS — Z23 Encounter for immunization: Secondary | ICD-10-CM

## 2019-04-30 NOTE — Telephone Encounter (Signed)
Patient notified & scheduled for [place].

## 2019-05-08 ENCOUNTER — Ambulatory Visit: Payer: Medicare Other

## 2019-05-13 ENCOUNTER — Ambulatory Visit (INDEPENDENT_AMBULATORY_CARE_PROVIDER_SITE_OTHER): Payer: Medicare Other

## 2019-05-13 ENCOUNTER — Ambulatory Visit: Payer: Medicare Other | Admitting: Family

## 2019-05-13 ENCOUNTER — Other Ambulatory Visit: Payer: Self-pay

## 2019-05-13 DIAGNOSIS — Z0000 Encounter for general adult medical examination without abnormal findings: Secondary | ICD-10-CM | POA: Diagnosis not present

## 2019-05-13 NOTE — Patient Instructions (Addendum)
  Elizabeth Fernandez , Thank you for taking time to come for your Medicare Wellness Visit. I appreciate your ongoing commitment to your health goals. Please review the following plan we discussed and let me know if I can assist you in the future.   These are the goals we discussed: Goals    . Maintain Healthy Lifestyle     Healthy diet Stay active Stay hydrated       This is a list of the screening recommended for you and due dates:  Health Maintenance  Topic Date Due  . Tetanus Vaccine  09/08/1952  . Flu Shot  Completed  . DEXA scan (bone density measurement)  Completed  . Pneumonia vaccines  Completed

## 2019-05-13 NOTE — Progress Notes (Signed)
Subjective:   Elizabeth Fernandez is a 85 y.o. female who presents for Medicare Annual (Subsequent) preventive examination.  Review of Systems:  No ROS.  Medicare Wellness Virtual Visit.  Visual/audio telehealth visit, UTA vital signs.   See social history for additional risk factors.   Cardiac Risk Factors include: advanced age (>8men, >82 women)     Objective:     Vitals: There were no vitals taken for this visit.  There is no height or weight on file to calculate BMI.  Advanced Directives 05/13/2019 05/09/2018 11/27/2017 11/08/2017 02/19/2017 02/18/2017 01/21/2017  Does Patient Have a Medical Advance Directive? Yes Yes No Yes No No No  Type of Paramedic of [place];Living will [place];Living will - Living will - - -  Does patient want to make changes to medical advance directive? No - Patient declined No - Patient declined - No - Patient declined - - -  Copy of [place] in Chart? No - copy requested No - copy requested - - - - -  Would patient like information on creating a medical advance directive? - - - No - Patient declined No - Patient declined No - Patient declined No - Patient declined    Tobacco Social History   Tobacco Use  Smoking Status Never Smoker  Smokeless Tobacco Never Used     Counseling given: Not Answered   Clinical Intake:  Pre-visit preparation completed: Yes        Diabetes: No  How often do you need to have someone help you when you read instructions, pamphlets, or other written materials from your doctor or pharmacy?: 1 - Never        Past Medical History:  Diagnosis Date   Arthritis    both knees   Cataract    Surgery scheduled for 03/2014   Past Surgical History:  Procedure Laterality Date   CHOLECYSTECTOMY     FEMUR SURGERY  11/10/2014   HIP ARTHROPLASTY Right 11/09/2017   Procedure: ARTHROPLASTY BIPOLAR HIP (HEMIARTHROPLASTY);  Surgeon: Earnestine Leys, MD;  Location:  [facility] ORS;  Service: Orthopedics;  Laterality: Right;   IR FLUORO GUIDED NEEDLE PLC ASPIRATION/INJECTION LOC  01/25/2017   Family History  Problem Relation Age of Onset   Cancer Mother    Heart disease Father 68       MI   Social History   Socioeconomic History   Marital status: Widowed    Spouse name: Not on file   Number of children: Not on file   Years of education: Not on file   Highest education level: Not on file  Occupational History   Not on file  Social Needs   Financial resource strain: Not hard at all   Food insecurity    Worry: Never true    Inability: Never true   Transportation needs    Medical: No    Non-medical: No  Tobacco Use   Smoking status: Never Smoker   Smokeless tobacco: Never Used  Substance and Sexual Activity   Alcohol use: No    Alcohol/week: 0.0 standard drinks   Drug use: No   Sexual activity: Not Currently  Lifestyle   Physical activity    Days per week: Not on file    Minutes per session: Not on file   Stress: Not at all  Relationships   Social connections    Talks on phone: Not on file    Gets together: Not on file  Attends religious service: Not on file    Active member of club or organization: Not on file    Attends meetings of clubs or organizations: Not on file    Relationship status: Not on file  Other Topics Concern   Not on file  Social History Narrative   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    Lives with son    Children- 2    Grandchildren- none    Pets: 1 dog inside    Enjoys- Reading and mowing yard     Outpatient Encounter Medications as of 05/13/2019  Medication Sig   ibuprofen (ADVIL) 100 MG tablet Take 200 mg by mouth every 8 (eight) hours as needed for fever.   meloxicam (MOBIC) 7.5 MG tablet Take 1 tablet (7.5 mg total) by mouth daily as needed for pain. (Patient not taking: Reported on 05/13/2019)   No facility-administered encounter medications on file as of  05/13/2019.     Activities of Daily Living In your present state of health, do you have any difficulty performing the following activities: 05/13/2019  Hearing? N  Vision? N  Difficulty concentrating or making decisions? N  Walking or climbing stairs? N  Dressing or bathing? N  Doing errands, shopping? N  Preparing Food and eating ? N  Using the Toilet? N  In the past six months, have you accidently leaked urine? N  Do you have problems with loss of bowel control? N  Managing your Medications? N  Managing your Finances? N  Housekeeping or managing your Housekeeping? N  Some recent data might be hidden    Patient Care Team: Burnard Hawthorne, FNP as PCP - General (Family Medicine)    Assessment:   This is a routine wellness examination for Elizabeth Fernandez.  I connected with patient 05/13/19 at 12:00 PM EDT by an audio enabled telemedicine application and verified that I am speaking with the correct person using two identifiers. Patient stated full name and DOB. Patient gave permission to continue with virtual visit. Patient's location was at home and Nurse's location was at [place] office.   Health Maintenance Due: -Tdap- discussed; to be completed with doctor in visit or local pharmacy.   Update all pending maintenance due as appropriate.   See completed HM at the end of note.   Eye: Visual acuity not assessed. Virtual visit. Wears corrective lenses. Followed by their ophthalmologist every 6 months.   Dental: Visits every 6 months.    Hearing: Demonstrates normal hearing during visit.  Safety:  Patient feels safe at home- yes Patient does have smoke detectors at home- yes Patient does wear sunscreen or protective clothing when in direct sunlight - yes Patient does wear seat belt when in a moving vehicle - yes Patient drives- yes Adequate lighting in walkways free from debris- yes Grab bars and handrails used as appropriate- yes Ambulates with no assistive device Cell phone on  person when ambulating outside of the home- yes  Social: Alcohol intake - no     Smoking history- never   Smokers in home? none Illicit drug use? none  Depression: PHQ 2 &9 complete. See screening below. Denies irritability, anhedonia, sadness/tearfullness.  Stable.   Falls: See screening below.    Medication: Taking as directed and without issues.   Covid-19: Precautions and sickness symptoms discussed. Wears mask, social distancing, hand hygiene as appropriate.   Activities of Daily Living Patient denies needing assistance with: household chores, feeding themselves, getting from bed to  chair, getting to the toilet, bathing/showering, dressing, managing money, or preparing meals.   Memory: Patient is alert. Patient denies difficulty focusing or concentrating. Correctly identified the president of the [place], season and recall.  Patient likes to read for brain stimulation.  BMI- discussed the importance of a healthy diet, water intake and the benefits of aerobic exercise.  Educational material provided.  Physical activity- walking, yard work. No routine.   Diet: regular Water: good intake Caffeine: 1 cup daily. Occasional pepsi.   Other Providers Patient Care Team: Burnard Hawthorne, FNP as PCP - General (Family Medicine)  Exercise Activities and Dietary recommendations    Goals     Maintain Healthy Lifestyle     Healthy diet Stay active Stay hydrated       Fall Risk Fall Risk  05/13/2019 04/20/2019 04/20/2019 05/09/2018 02/25/2017  Falls in the past year? 0 0 0 No No  Number falls in past yr: - - - - -  Injury with Fall? - - - - -  Risk for fall due to : - History of fall(s) Impaired mobility - -  Risk for fall due to: Comment - - - - -  Follow up - Falls evaluation completed - - -   Timed Get Up and Go performed: no, virtual visit  Depression Screen PHQ 2/9 Scores 05/13/2019 05/09/2018 02/25/2017 02/18/2017  PHQ - 2 Score 0 0 0 0     Cognitive  Function MMSE - Mini Mental State Exam 10/24/2016 10/24/2015  Orientation to time 5 5  Orientation to Place 5 5  Registration 3 3  Attention/ Calculation 5 5  Recall 3 3  Language- name 2 objects 2 2  Language- repeat 1 1  Language- follow 3 step command 3 3  Language- read & follow direction 1 1  Write a sentence 1 1  Copy design 1 1  Total score 30 30     6CIT Screen 05/13/2019 05/09/2018  What Year? 0 points 0 points  What month? 0 points 0 points  What time? 0 points 0 points  Count back from 20 0 points 0 points  Months in reverse 0 points 0 points  Repeat phrase 0 points 0 points  Total Score 0 0    Immunization History  Administered Date(s) Administered   Fluad Quad(high Dose 65+) 04/30/2019   Influenza, High Dose Seasonal PF 06/19/2016, 05/09/2018   Influenza,inj,Quad PF,6+ Mos 07/26/2015   PPD Test 11/18/2014   Pneumococcal Conjugate-13 10/24/2016   Pneumococcal Polysaccharide-23 04/30/2019   Screening Tests Health Maintenance  Topic Date Due   TETANUS/TDAP  09/08/1952   INFLUENZA VACCINE  Completed   DEXA SCAN  Completed   PNA vac Low Risk Adult  Completed      Plan:   Keep all routine maintenance appointments.   Schedule follow up as needed.    Medicare Attestation I have personally reviewed: The patient's medical and social history Their use of alcohol, tobacco or illicit drugs Their current medications and supplements The patient's functional ability including ADLs,fall risks, home safety risks, cognitive, and hearing and visual impairment Diet and physical activities Evidence for depression   In addition, I have reviewed and discussed with patient certain preventive protocols, quality metrics, and best practice recommendations. A written personalized care plan for preventive services as well as general preventive health recommendations were provided to patient vial mail.     Varney Biles, LPN  [id number]

## 2019-05-25 ENCOUNTER — Encounter (INDEPENDENT_AMBULATORY_CARE_PROVIDER_SITE_OTHER): Payer: Medicare Other | Admitting: Ophthalmology

## 2019-06-03 ENCOUNTER — Encounter (INDEPENDENT_AMBULATORY_CARE_PROVIDER_SITE_OTHER): Payer: Medicare Other | Admitting: Ophthalmology

## 2019-06-03 ENCOUNTER — Other Ambulatory Visit: Payer: Self-pay

## 2019-06-03 DIAGNOSIS — H34811 Central retinal vein occlusion, right eye, with macular edema: Secondary | ICD-10-CM

## 2019-06-03 DIAGNOSIS — H43813 Vitreous degeneration, bilateral: Secondary | ICD-10-CM | POA: Diagnosis not present

## 2019-06-03 DIAGNOSIS — I10 Essential (primary) hypertension: Secondary | ICD-10-CM

## 2019-06-03 DIAGNOSIS — H35033 Hypertensive retinopathy, bilateral: Secondary | ICD-10-CM | POA: Diagnosis not present

## 2019-07-15 ENCOUNTER — Encounter (INDEPENDENT_AMBULATORY_CARE_PROVIDER_SITE_OTHER): Payer: Medicare Other | Admitting: Ophthalmology

## 2019-07-17 ENCOUNTER — Telehealth: Payer: Self-pay | Admitting: Family

## 2019-07-17 NOTE — Telephone Encounter (Signed)
I called patient but was unable to reach. No answering machine to leave message.

## 2019-07-17 NOTE — Telephone Encounter (Signed)
Call pt We never had f/u from our September visit. She also didn't have ultrasound of legs due to swelling. Is she sure she doesn't want to have [place] as we discussed in last appt? Please schedule f/u with me and ensure she has no acute concerns

## 2019-07-23 ENCOUNTER — Encounter (INDEPENDENT_AMBULATORY_CARE_PROVIDER_SITE_OTHER): Payer: Medicare Other | Admitting: Ophthalmology

## 2019-07-24 NOTE — Telephone Encounter (Signed)
I called and spoke with patient. As of now she wasn't having issues with swelling so she did not feel the need for [place]. She did want a follow-up & to have blood work done. Patient is scheduled 12/23 for phone visit.

## 2019-07-29 ENCOUNTER — Ambulatory Visit: Payer: Medicare Other | Admitting: Family

## 2019-08-11 ENCOUNTER — Encounter (INDEPENDENT_AMBULATORY_CARE_PROVIDER_SITE_OTHER): Payer: Medicare Other | Admitting: Ophthalmology

## 2019-08-26 ENCOUNTER — Encounter (INDEPENDENT_AMBULATORY_CARE_PROVIDER_SITE_OTHER): Payer: PPO | Admitting: Ophthalmology

## 2019-08-26 DIAGNOSIS — I10 Essential (primary) hypertension: Secondary | ICD-10-CM

## 2019-08-26 DIAGNOSIS — H34811 Central retinal vein occlusion, right eye, with macular edema: Secondary | ICD-10-CM

## 2019-08-26 DIAGNOSIS — H35033 Hypertensive retinopathy, bilateral: Secondary | ICD-10-CM

## 2019-08-26 DIAGNOSIS — H43813 Vitreous degeneration, bilateral: Secondary | ICD-10-CM

## 2019-10-14 ENCOUNTER — Encounter (INDEPENDENT_AMBULATORY_CARE_PROVIDER_SITE_OTHER): Payer: PPO | Admitting: Ophthalmology

## 2019-10-27 ENCOUNTER — Other Ambulatory Visit: Payer: Self-pay

## 2019-10-27 ENCOUNTER — Encounter (INDEPENDENT_AMBULATORY_CARE_PROVIDER_SITE_OTHER): Payer: PPO | Admitting: Ophthalmology

## 2019-10-27 DIAGNOSIS — H43813 Vitreous degeneration, bilateral: Secondary | ICD-10-CM | POA: Diagnosis not present

## 2019-10-27 DIAGNOSIS — H34811 Central retinal vein occlusion, right eye, with macular edema: Secondary | ICD-10-CM | POA: Diagnosis not present

## 2019-10-27 DIAGNOSIS — H35033 Hypertensive retinopathy, bilateral: Secondary | ICD-10-CM

## 2019-10-27 DIAGNOSIS — I10 Essential (primary) hypertension: Secondary | ICD-10-CM

## 2019-12-15 ENCOUNTER — Other Ambulatory Visit: Payer: Self-pay

## 2019-12-15 ENCOUNTER — Encounter (INDEPENDENT_AMBULATORY_CARE_PROVIDER_SITE_OTHER): Payer: PPO | Admitting: Ophthalmology

## 2019-12-15 DIAGNOSIS — H35033 Hypertensive retinopathy, bilateral: Secondary | ICD-10-CM | POA: Diagnosis not present

## 2019-12-15 DIAGNOSIS — H43813 Vitreous degeneration, bilateral: Secondary | ICD-10-CM

## 2019-12-15 DIAGNOSIS — I10 Essential (primary) hypertension: Secondary | ICD-10-CM | POA: Diagnosis not present

## 2019-12-15 DIAGNOSIS — H353121 Nonexudative age-related macular degeneration, left eye, early dry stage: Secondary | ICD-10-CM | POA: Diagnosis not present

## 2019-12-15 DIAGNOSIS — H34811 Central retinal vein occlusion, right eye, with macular edema: Secondary | ICD-10-CM | POA: Diagnosis not present

## 2020-02-10 ENCOUNTER — Encounter (INDEPENDENT_AMBULATORY_CARE_PROVIDER_SITE_OTHER): Payer: PPO | Admitting: Ophthalmology

## 2020-03-01 ENCOUNTER — Other Ambulatory Visit: Payer: Self-pay

## 2020-05-13 ENCOUNTER — Ambulatory Visit (INDEPENDENT_AMBULATORY_CARE_PROVIDER_SITE_OTHER): Payer: PPO

## 2020-05-13 VITALS — Ht 63.0 in | Wt 149.0 lb

## 2020-05-13 DIAGNOSIS — Z0000 Encounter for general adult medical examination without abnormal findings: Secondary | ICD-10-CM

## 2020-05-13 NOTE — Patient Instructions (Addendum)
Elizabeth Fernandez , Thank you for taking time to come for your Medicare Wellness Visit. I appreciate your ongoing commitment to your health goals. Please review the following plan we discussed and let me know if I can assist you in the future.   These are the goals we discussed: Goals    . Maintain Healthy Lifestyle     Healthy diet Stay active Stay hydrated       This is a list of the screening recommended for you and due dates:  Health Maintenance  Topic Date Due  . Flu Shot  11/03/2020*  . Tetanus Vaccine  05/13/2021*  . DEXA scan (bone density measurement)  Completed  . COVID-19 Vaccine  Completed  . Pneumonia vaccines  Completed  *Topic was postponed. The date shown is not the original due date.    Immunizations Immunization History  Administered Date(s) Administered  . Fluad Quad(high Dose 65+) 04/30/2019  . Influenza, High Dose Seasonal PF 06/19/2016, 05/09/2018  . Influenza,inj,Quad PF,6+ Mos 07/26/2015  . PFIZER SARS-COV-2 Vaccination 08/21/2019, 09/11/2019  . PPD Test 11/18/2014  . Pneumococcal Conjugate-13 10/24/2016  . Pneumococcal Polysaccharide-23 04/30/2019   Keep all routine maintenance appointments.   Follow up 05/18/20 @ 12:00  Advanced directives: End of life planning; Advance aging; Advanced directives discussed.  Copy of current HCPOA/Living Will requested.    Follow up in one year for your annual wellness visit.   Preventive Care 84 Years and Older, Female Preventive care refers to lifestyle choices and visits with your health care provider that can promote health and wellness. What does preventive care include?  A yearly physical exam. This is also called an annual well check.  Dental exams once or twice a year.  Routine eye exams. Ask your health care provider how often you should have your eyes checked.  Personal lifestyle choices, including:  Daily care of your teeth and gums.  Regular physical activity.  Eating a healthy  diet.  Avoiding tobacco and drug use.  Limiting alcohol use.  Practicing safe sex.  Taking low-dose aspirin every day.  Taking vitamin and mineral supplements as recommended by your health care provider. What happens during an annual well check? The services and screenings done by your health care provider during your annual well check will depend on your age, overall health, lifestyle risk factors, and family history of disease. Counseling  Your health care provider may ask you questions about your:  Alcohol use.  Tobacco use.  Drug use.  Emotional well-being.  Home and relationship well-being.  Sexual activity.  Eating habits.  History of falls.  Memory and ability to understand (cognition).  Work and work Statistician.  Reproductive health. Screening  You may have the following tests or measurements:  Height, weight, and BMI.  Blood pressure.  Lipid and cholesterol levels. These may be checked every 5 years, or more frequently if you are over 79 years old.  Skin check.  Lung cancer screening. You may have this screening every year starting at age 26 if you have a 30-pack-year history of smoking and currently smoke or have quit within the past 15 years.  Fecal occult blood test (FOBT) of the stool. You may have this test every year starting at age 72.  Flexible sigmoidoscopy or colonoscopy. You may have a sigmoidoscopy every 5 years or a colonoscopy every 10 years starting at age 15.  Hepatitis C blood test.  Hepatitis B blood test.  Sexually transmitted disease (STD) testing.  Diabetes screening. This is  done by checking your blood sugar (glucose) after you have not eaten for a while (fasting). You may have this done every 1-3 years.  Bone density scan. This is done to screen for osteoporosis. You may have this done starting at age 13.  Mammogram. This may be done every 1-2 years. Talk to your health care provider about how often you should have  regular mammograms. Talk with your health care provider about your test results, treatment options, and if necessary, the need for more tests. Vaccines  Your health care provider may recommend certain vaccines, such as:  Influenza vaccine. This is recommended every year.  Tetanus, diphtheria, and acellular pertussis (Tdap, Td) vaccine. You may need a Td booster every 10 years.  Zoster vaccine. You may need this after age 18.  Pneumococcal 13-valent conjugate (PCV13) vaccine. One dose is recommended after age 46.  Pneumococcal polysaccharide (PPSV23) vaccine. One dose is recommended after age 63. Talk to your health care provider about which screenings and vaccines you need and how often you need them. This information is not intended to replace advice given to you by your health care provider. Make sure you discuss any questions you have with your health care provider. Document Released: 08/19/2015 Document Revised: 04/11/2016 Document Reviewed: 05/24/2015 Elsevier Interactive Patient Education  2017 [place] Prevention in the Home Falls can cause injuries. They can happen to people of all ages. There are many things you can do to make your home safe and to help prevent falls. What can I do on the outside of my home?  Regularly fix the edges of walkways and driveways and fix any cracks.  Remove anything that might make you trip as you walk through a door, such as a raised step or threshold.  Trim any bushes or trees on the path to your home.  Use bright outdoor lighting.  Clear any walking paths of anything that might make someone trip, such as rocks or tools.  Regularly check to see if handrails are loose or broken. Make sure that both sides of any steps have handrails.  Any raised decks and porches should have guardrails on the edges.  Have any leaves, snow, or ice cleared regularly.  Use sand or salt on walking paths during winter.  Clean up any spills in your  garage right away. This includes oil or grease spills. What can I do in the bathroom?  Use night lights.  Install grab bars by the toilet and in the tub and shower. Do not use towel bars as grab bars.  Use non-skid mats or decals in the tub or shower.  If you need to sit down in the shower, use a plastic, non-slip stool.  Keep the floor dry. Clean up any water that spills on the floor as soon as it happens.  Remove soap buildup in the tub or shower regularly.  Attach bath mats securely with double-sided non-slip rug tape.  Do not have throw rugs and other things on the floor that can make you trip. What can I do in the bedroom?  Use night lights.  Make sure that you have a light by your bed that is easy to reach.  Do not use any sheets or blankets that are too big for your bed. They should not hang down onto the floor.  Have a firm chair that has side arms. You can use this for support while you get dressed.  Do not have throw rugs and other things  on the floor that can make you trip. What can I do in the kitchen?  Clean up any spills right away.  Avoid walking on wet floors.  Keep items that you use a lot in easy-to-reach places.  If you need to reach something above you, use a strong step stool that has a grab bar.  Keep electrical cords out of the way.  Do not use floor polish or wax that makes floors slippery. If you must use wax, use non-skid floor wax.  Do not have throw rugs and other things on the floor that can make you trip. What can I do with my stairs?  Do not leave any items on the stairs.  Make sure that there are handrails on both sides of the stairs and use them. Fix handrails that are broken or loose. Make sure that handrails are as long as the stairways.  Check any carpeting to make sure that it is firmly attached to the stairs. Fix any carpet that is loose or worn.  Avoid having throw rugs at the top or bottom of the stairs. If you do have throw  rugs, attach them to the floor with carpet tape.  Make sure that you have a light switch at the top of the stairs and the bottom of the stairs. If you do not have them, ask someone to add them for you. What else can I do to help prevent falls?  Wear shoes that:  Do not have high heels.  Have rubber bottoms.  Are comfortable and fit you well.  Are closed at the toe. Do not wear sandals.  If you use a stepladder:  Make sure that it is fully opened. Do not climb a closed stepladder.  Make sure that both sides of the stepladder are locked into place.  Ask someone to hold it for you, if possible.  Clearly mark and make sure that you can see:  Any grab bars or handrails.  First and last steps.  Where the edge of each step is.  Use tools that help you move around (mobility aids) if they are needed. These include:  Canes.  Walkers.  Scooters.  Crutches.  Turn on the lights when you go into a dark area. Replace any light bulbs as soon as they burn out.  Set up your furniture so you have a clear path. Avoid moving your furniture around.  If any of your floors are uneven, fix them.  If there are any pets around you, be aware of where they are.  Review your medicines with your doctor. Some medicines can make you feel dizzy. This can increase your chance of falling. Ask your doctor what other things that you can do to help prevent falls. This information is not intended to replace advice given to you by your health care provider. Make sure you discuss any questions you have with your health care provider. Document Released: 05/19/2009 Document Revised: 12/29/2015 Document Reviewed: 08/27/2014 Elsevier Interactive Patient Education  2017 [employer].

## 2020-05-13 NOTE — Progress Notes (Addendum)
Subjective:   Elizabeth Fernandez is a 86 y.o. female who presents for Medicare Annual (Subsequent) preventive examination.  Review of Systems    No ROS.  Medicare Wellness Virtual Visit.   Cardiac Risk Factors include: advanced age (>17men, >25 women)     Objective:    Today's Vitals   05/13/20 1102  Weight: 149 lb (67.6 kg)  Height: 5\' 3"  (1.6 m)   Body mass index is 26.39 kg/m.  Advanced Directives 05/13/2020 05/13/2019 05/09/2018 11/27/2017 11/08/2017 02/19/2017 02/18/2017  Does Patient Have a Medical Advance Directive? Yes Yes Yes No Yes No No  Type of Paramedic of [place];Living will [place];Living will [place];Living will - Living will - -  Does patient want to make changes to medical advance directive? No - Patient declined No - Patient declined No - Patient declined - No - Patient declined - -  Copy of [place] in Chart? No - copy requested No - copy requested No - copy requested - - - -  Would patient like information on creating a medical advance directive? - - - - No - Patient declined No - Patient declined No - Patient declined    Current Medications (verified) Outpatient Encounter Medications as of 05/13/2020  Medication Sig   ibuprofen (ADVIL) 100 MG tablet Take 200 mg by mouth every 8 (eight) hours as needed for fever.   meloxicam (MOBIC) 7.5 MG tablet Take 1 tablet (7.5 mg total) by mouth daily as needed for pain. (Patient not taking: Reported on 05/13/2019)   No facility-administered encounter medications on file as of 05/13/2020.    Allergies (verified) Pollen extract   History: Past Medical History:  Diagnosis Date   Arthritis    both knees   Cataract    Surgery scheduled for 03/2014   Past Surgical History:  Procedure Laterality Date   CHOLECYSTECTOMY     FEMUR SURGERY  11/10/2014   HIP ARTHROPLASTY Right 11/09/2017   Procedure: ARTHROPLASTY BIPOLAR HIP  (HEMIARTHROPLASTY);  Surgeon: Earnestine Leys, MD;  Location: [facility] ORS;  Service: Orthopedics;  Laterality: Right;   IR FLUORO GUIDED NEEDLE PLC ASPIRATION/INJECTION LOC  01/25/2017   Family History  Problem Relation Age of Onset   Cancer Mother    Heart disease Father 36       MI   Social History   Socioeconomic History   Marital status: Widowed    Spouse name: Not on file   Number of children: Not on file   Years of education: Not on file   Highest education level: Not on file  Occupational History   Not on file  Tobacco Use   Smoking status: Never Smoker   Smokeless tobacco: Never Used  Vaping Use   Vaping Use: Never used  Substance and Sexual Activity   Alcohol use: No    Alcohol/week: 0.0 standard drinks   Drug use: No   Sexual activity: Not Currently  Other Topics Concern   Not on file  Social History Narrative   May go back to work after recovery 2016; [employer].    Lives with son    Children- 2    Grandchildren- none    Pets: 1 dog inside    Enjoys- Reading and mowing yard    Social Determinants of Radio broadcast assistant Strain: Low Risk    Difficulty of Paying Living Expenses: Not hard at all  Food Insecurity: No Food Insecurity  Worried About Charity fundraiser in the Last Year: Never true   [place] in the Last Year: Never true  Transportation Needs: No Transportation Needs   Lack of Transportation (Medical): No   Lack of Transportation (Non-Medical): No  Physical Activity:    Days of Exercise per Week: Not on file   Minutes of Exercise per Session: Not on file  Stress: No Stress Concern Present   Feeling of Stress : Not at all  Social Connections:    Frequency of Communication with Friends and Family: Not on file   Frequency of Social Gatherings with Friends and Family: Not on file   Attends Religious Services: Not on file   Active Member of Clubs or Organizations: Not on file   Attends Theatre manager Meetings: Not on file   Marital Status: Not on file    Tobacco Counseling Counseling given: Not Answered   Clinical Intake:  Pre-visit preparation completed: Yes        Diabetes: No  How often do you need to have someone help you when you read instructions, pamphlets, or other written materials from your doctor or pharmacy?: 1 - Never    Interpreter Needed?: No      Activities of Daily Living In your present state of health, do you have any difficulty performing the following activities: 05/13/2020  Hearing? N  Vision? N  Difficulty concentrating or making decisions? N  Walking or climbing stairs? Y  Comment Unsteady gait. SOBOE. Cane in use when ambulating.  Dressing or bathing? N  Doing errands, shopping? N  Preparing Food and eating ? N  Using the Toilet? N  In the past six months, have you accidently leaked urine? N  Do you have problems with loss of bowel control? N  Managing your Medications? N  Managing your Finances? N  Housekeeping or managing your Housekeeping? N  Some recent data might be hidden    Patient Care Team: Burnard Hawthorne, FNP as PCP - General (Family Medicine)  Indicate any recent Medical Services you may have received from other than [facility] providers in the past year (date may be approximate).     Assessment:   This is a routine wellness examination for Elizabeth Fernandez.  I connected with Elizabeth Fernandez today by telephone and verified that I am speaking with the correct person using two identifiers. Location patient: home Location provider: work Persons participating in the virtual visit: patient, Marine scientist.    I discussed the limitations, risks, security and privacy concerns of performing an evaluation and management service by telephone and the availability of in person appointments. The patient expressed understanding and verbally consented to this telephonic visit.    Interactive audio and video telecommunications were attempted between  this provider and patient, however failed, due to patient having technical difficulties OR patient did not have access to video capability.  We continued and completed visit with audio only.  Some vital signs may be absent or patient reported.   Hearing/Vision screen  Hearing Screening   125Hz  250Hz  500Hz  1000Hz  2000Hz  3000Hz  4000Hz  6000Hz  8000Hz   Right ear:           Left ear:           Comments: Patient is able to hear conversational tones without difficulty. No issues reported.  Vision Screening Comments: Wears corrective lenses  Cataract extraction, bilateral  Visual acuity not assessed, virtual visit   Dietary issues and exercise activities discussed: Current Exercise Habits: Home exercise  routine, Intensity: Mild  Healthy diet Good water intake  Goals     Maintain Healthy Lifestyle     Healthy diet Stay active Stay hydrated      Depression Screen PHQ 2/9 Scores 05/13/2020 05/13/2019 05/09/2018 02/25/2017 02/18/2017 10/24/2016 10/24/2015  PHQ - 2 Score 0 0 0 0 0 0 0    Fall Risk Fall Risk  05/13/2020 05/13/2019 04/20/2019 04/20/2019 05/09/2018  Falls in the past year? 0 0 0 0 No  Number falls in past yr: 0 - - - -  Injury with Fall? - - - - -  Risk for fall due to : - - History of fall(s) Impaired mobility -  Risk for fall due to: Comment - - - - -  Follow up Falls evaluation completed - Falls evaluation completed - -   Handrails in use when climbing stairs? Yes Home free of loose throw rugs in walkways, pet beds, electrical cords, etc? Yes  Adequate lighting in your home to reduce risk of falls? Yes   ASSISTIVE DEVICES UTILIZED TO PREVENT FALLS: Use of a cane, walker or w/c? Yes   TIMED UP AND GO: Was the test performed? No . Virtual visit.   Cognitive Function: Patient is alert and oriented x3.  Denies difficulty focusing, making decisions, memory loss.  Enjoys 3-5 books weekly and brain challenging activities.   MMSE - Mini Mental State Exam 10/24/2016 10/24/2015    Orientation to time 5 5  Orientation to Place 5 5  Registration 3 3  Attention/ Calculation 5 5  Recall 3 3  Language- name 2 objects 2 2  Language- repeat 1 1  Language- follow 3 step command 3 3  Language- read & follow direction 1 1  Write a sentence 1 1  Copy design 1 1  Total score 30 30     6CIT Screen 05/13/2020 05/13/2019 05/09/2018  What Year? 0 points 0 points 0 points  What month? 0 points 0 points 0 points  What time? 0 points 0 points 0 points  Count back from 20 - 0 points 0 points  Months in reverse 0 points 0 points 0 points  Repeat phrase - 0 points 0 points  Total Score - 0 0   Immunizations Immunization History  Administered Date(s) Administered   Fluad Quad(high Dose 65+) 04/30/2019   Influenza, High Dose Seasonal PF 06/19/2016, 05/09/2018   Influenza,inj,Quad PF,6+ Mos 07/26/2015   PFIZER SARS-COV-2 Vaccination 08/21/2019, 09/11/2019   PPD Test 11/18/2014   Pneumococcal Conjugate-13 10/24/2016   Pneumococcal Polysaccharide-23 04/30/2019   TDAP status: Due, Education has been provided regarding the importance of this vaccine. Advised may receive this vaccine at local pharmacy or Health Dept. Aware to provide a copy of the vaccination record if obtained from local pharmacy or Health Dept. Verbalized acceptance and understanding. Deferred.   Health Maintenance Health Maintenance  Topic Date Due   INFLUENZA VACCINE  11/03/2020 (Originally 03/06/2020)   TETANUS/TDAP  05/13/2021 (Originally 09/08/1952)   DEXA SCAN  Completed   COVID-19 Vaccine  Completed   PNA vac Low Risk Adult  Completed   Dental Screening: Recommended annual dental exams for proper oral hygiene  Community Resource Referral / Chronic Care Management: CRR required this visit?  No   CCM required this visit?  No      Plan:   Keep all routine maintenance appointments.   Follow up 05/18/20 @ 12:00  I have personally reviewed and noted the following in the patients chart:  Medical and social history  Use of alcohol, tobacco or illicit drugs   Current medications and supplements  Functional ability and status  Nutritional status  Physical activity  Advanced directives  List of other physicians  Hospitalizations, surgeries, and ER visits in previous 12 months  Vitals  Screenings to include cognitive, depression, and falls  Referrals and appointments  In addition, I have reviewed and discussed with patient certain preventive protocols, quality metrics, and best practice recommendations. A written personalized care plan for preventive services as well as general preventive health recommendations were provided to patient via mail.     Varney Biles, LPN   29/04/2425     Agree with plan. Mable Paris, NP

## 2020-05-18 ENCOUNTER — Ambulatory Visit: Payer: PPO | Admitting: Family

## 2020-10-19 ENCOUNTER — Ambulatory Visit (INDEPENDENT_AMBULATORY_CARE_PROVIDER_SITE_OTHER): Payer: PPO | Admitting: Internal Medicine

## 2020-10-19 ENCOUNTER — Encounter: Payer: Self-pay | Admitting: Internal Medicine

## 2020-10-19 ENCOUNTER — Other Ambulatory Visit: Payer: Self-pay

## 2020-10-19 VITALS — BP 126/80 | HR 78 | Temp 98.2°F | Ht 63.0 in | Wt 147.6 lb

## 2020-10-19 DIAGNOSIS — H5789 Other specified disorders of eye and adnexa: Secondary | ICD-10-CM

## 2020-10-19 DIAGNOSIS — M810 Age-related osteoporosis without current pathological fracture: Secondary | ICD-10-CM | POA: Diagnosis not present

## 2020-10-19 DIAGNOSIS — S0096XA Insect bite (nonvenomous) of unspecified part of head, initial encounter: Secondary | ICD-10-CM

## 2020-10-19 DIAGNOSIS — H1013 Acute atopic conjunctivitis, bilateral: Secondary | ICD-10-CM | POA: Diagnosis not present

## 2020-10-19 DIAGNOSIS — Z0000 Encounter for general adult medical examination without abnormal findings: Secondary | ICD-10-CM | POA: Diagnosis not present

## 2020-10-19 DIAGNOSIS — W57XXXA Bitten or stung by nonvenomous insect and other nonvenomous arthropods, initial encounter: Secondary | ICD-10-CM

## 2020-10-19 DIAGNOSIS — R946 Abnormal results of thyroid function studies: Secondary | ICD-10-CM | POA: Diagnosis not present

## 2020-10-19 DIAGNOSIS — H05012 Cellulitis of left orbit: Secondary | ICD-10-CM

## 2020-10-19 DIAGNOSIS — R7989 Other specified abnormal findings of blood chemistry: Secondary | ICD-10-CM | POA: Diagnosis not present

## 2020-10-19 DIAGNOSIS — E559 Vitamin D deficiency, unspecified: Secondary | ICD-10-CM

## 2020-10-19 DIAGNOSIS — E538 Deficiency of other specified B group vitamins: Secondary | ICD-10-CM

## 2020-10-19 DIAGNOSIS — H01119 Allergic dermatitis of unspecified eye, unspecified eyelid: Secondary | ICD-10-CM

## 2020-10-19 DIAGNOSIS — D649 Anemia, unspecified: Secondary | ICD-10-CM

## 2020-10-19 DIAGNOSIS — E039 Hypothyroidism, unspecified: Secondary | ICD-10-CM

## 2020-10-19 LAB — COMPREHENSIVE METABOLIC PANEL
ALT: 17 U/L (ref 0–35)
AST: 20 U/L (ref 0–37)
Albumin: 3.9 g/dL (ref 3.5–5.2)
Alkaline Phosphatase: 100 U/L (ref 39–117)
BUN: 20 mg/dL (ref 6–23)
CO2: 33 mEq/L — ABNORMAL HIGH (ref 19–32)
Calcium: 8.9 mg/dL (ref 8.4–10.5)
Chloride: 105 mEq/L (ref 96–112)
Creatinine, Ser: 0.7 mg/dL (ref 0.40–1.20)
GFR: 79.03 mL/min (ref >60.00)
Glucose, Bld: 84 mg/dL (ref 70–99)
Potassium: 4.3 mEq/L (ref 3.5–5.1)
Sodium: 143 mEq/L (ref 135–145)
Total Bilirubin: 0.6 mg/dL (ref 0.2–1.2)
Total Protein: 6.6 g/dL (ref 6.0–8.3)

## 2020-10-19 LAB — CBC WITH DIFFERENTIAL/PLATELET
Basophils Absolute: 0.1 10*3/uL (ref 0.0–0.1)
Basophils Relative: 0.9 % (ref 0.0–3.0)
Eosinophils Absolute: 0.2 10*3/uL (ref 0.0–0.7)
Eosinophils Relative: 3.1 % (ref 0.0–5.0)
HCT: 37.2 % (ref 36.0–46.0)
Hemoglobin: 12.5 g/dL (ref 12.0–15.0)
Lymphocytes Relative: 21.4 % (ref 12.0–46.0)
Lymphs Abs: 1.4 10*3/uL (ref 0.7–4.0)
MCHC: 33.5 g/dL (ref 30.0–36.0)
MCV: 89.8 fl (ref 78.0–100.0)
Monocytes Absolute: 0.4 10*3/uL (ref 0.1–1.0)
Monocytes Relative: 6.6 % (ref 3.0–12.0)
Neutro Abs: 4.6 10*3/uL (ref 1.4–7.7)
Neutrophils Relative %: 68 % (ref 43.0–77.0)
Platelets: 221 10*3/uL (ref 150.0–400.0)
RBC: 4.14 Mil/uL (ref 3.87–5.11)
RDW: 13.1 % (ref 11.5–15.5)
WBC: 6.7 10*3/uL (ref 4.0–10.5)

## 2020-10-19 LAB — LIPID PANEL
Cholesterol: 190 mg/dL (ref 0–200)
HDL: 81.5 mg/dL (ref >39.00)
LDL Cholesterol: 97 mg/dL (ref 0–99)
NonHDL: 108.27
Total CHOL/HDL Ratio: 2
Triglycerides: 54 mg/dL (ref 0.0–149.0)
VLDL: 10.8 mg/dL (ref 0.0–40.0)

## 2020-10-19 LAB — T4, FREE: Free T4: 0.68 ng/dL (ref 0.60–1.60)

## 2020-10-19 LAB — T3, FREE: T3, Free: 3.5 pg/mL (ref 2.3–4.2)

## 2020-10-19 LAB — TSH: TSH: 12.74 u[IU]/mL — ABNORMAL HIGH (ref 0.35–4.50)

## 2020-10-19 LAB — VITAMIN B12: Vitamin B-12: 365 pg/mL (ref 211–911)

## 2020-10-19 MED ORDER — DOXYCYCLINE HYCLATE 100 MG PO TABS: 100.0000 mg | ORAL_TABLET | Freq: Two times a day (BID) | ORAL | 0 refills | Status: DC

## 2020-10-19 MED ORDER — OLOPATADINE HCL 0.2 % OP SOLN: 1.0000 [drp] | Freq: Every day | OPHTHALMIC | 0 refills | Status: DC

## 2020-10-19 MED ORDER — HYDROCORTISONE 2.5 % EX LOTN
TOPICAL_LOTION | Freq: Two times a day (BID) | CUTANEOUS | 0 refills | Status: DC
Start: 2020-10-19 — End: 2024-04-08

## 2020-10-19 MED ORDER — DOXYCYCLINE HYCLATE 100 MG PO TABS
100.0000 mg | ORAL_TABLET | Freq: Two times a day (BID) | ORAL | 0 refills | Status: DC
Start: 2020-10-19 — End: 2020-10-19

## 2020-10-19 NOTE — Patient Instructions (Signed)
Cellulitis, Adult  Cellulitis is a skin infection. The infected area is often warm, red, swollen, and sore. It occurs most often in the arms and lower legs. It is very important to get treated for this condition. What are the causes? This condition is caused by bacteria. The bacteria enter through a break in the skin, such as a cut, burn, insect bite, open sore, or crack. What increases the risk? This condition is more likely to occur in people who:  Have a weak body defense system (immune system).  Have open cuts, burns, bites, or scrapes on the skin.  Are older than 85 years of age.  Have a blood sugar problem (diabetes).  Have a long-lasting (chronic) liver disease (cirrhosis) or kidney disease.  Are very overweight (obese).  Have a skin problem, such as: ? Itchy rash (eczema). ? Slow movement of blood in the veins (venous stasis). ? Fluid buildup below the skin (edema).  Have been treated with high-energy rays (radiation).  Use IV drugs. What are the signs or symptoms? Symptoms of this condition include:  Skin that is: ? Red. ? Streaking. ? Spotting. ? Swollen. ? Sore or painful when you touch it. ? Warm.  A fever.  Chills.  Blisters. How is this diagnosed? This condition is diagnosed based on:  Medical history.  Physical exam.  Blood tests.  Imaging tests. How is this treated? Treatment for this condition may include:  Medicines to treat infections or allergies.  Home care, such as: ? Rest. ? Placing cold or warm cloths (compresses) on the skin.  Hospital care, if the condition is very bad. Follow these instructions at home: Medicines  Take over-the-counter and prescription medicines only as told by your doctor.  If you were prescribed an antibiotic medicine, take it as told by your doctor. Do not stop taking it even if you start to feel better. General instructions  Drink enough fluid to keep your pee (urine) pale yellow.  Do not touch  or rub the infected area.  Raise (elevate) the infected area above the level of your heart while you are sitting or lying down.  Place cold or warm cloths on the area as told by your doctor.  Keep all follow-up visits as told by your doctor. This is important.   Contact a doctor if:  You have a fever.  You do not start to get better after 1-2 days of treatment.  Your bone or joint under the infected area starts to hurt after the skin has healed.  Your infection comes back. This can happen in the same area or another area.  You have a swollen bump in the area.  You have new symptoms.  You feel ill and have muscle aches and pains. Get help right away if:  Your symptoms get worse.  You feel very sleepy.  You throw up (vomit) or have watery poop (diarrhea) for a long time.  You see red streaks coming from the area.  Your red area gets larger.  Your red area turns dark in color. These symptoms may represent a serious problem that is an emergency. Do not wait to see if the symptoms will go away. Get medical help right away. Call your local emergency services (911 in the [place] Do not drive yourself to the hospital. Summary  Cellulitis is a skin infection. The area is often warm, red, swollen, and sore.  This condition is treated with medicines, rest, and cold and warm cloths.  Take all medicines   only as told by your doctor.  Tell your doctor if symptoms do not start to get better after 1-2 days of treatment. This information is not intended to replace advice given to you by your health care provider. Make sure you discuss any questions you have with your health care provider. Document Revised: 12/12/2017 Document Reviewed: 12/12/2017 Elsevier Patient Education  2021 [place], Adult An insect bite can make your skin red, itchy, and swollen. An insect bite is different from an insect sting, which happens when an insect injects poison (venom) into the  skin. Some insects can spread disease to people through a bite. However, most insect bites do not lead to disease and are not serious. What are the causes? Insects may bite for a variety of reasons, including:  Hunger.  To defend themselves. Insects that bite include:  Spiders.  Mosquitoes.  Ticks.  Fleas.  Ants.  Flies.  Kissing bugs.  Chiggers. What are the signs or symptoms? Symptoms of this condition include:  Itching or pain in the bite area.  Redness and swelling in the bite area.  An open wound (skin ulcer). In many cases, symptoms last for 2-4 days. In rare cases, a person may have a severe allergic reaction (anaphylactic reaction) to a bite. Symptoms of an anaphylactic reaction may include:  Feeling warm in the face (flushed). This may include redness.  Itchy, red, swollen areas of skin (hives).  Swelling of the eyes, lips, face, mouth, tongue, or throat.  Difficulty breathing, speaking, or swallowing.  Noisy breathing (wheezing).  Dizziness or light-headedness.  Fainting.  Pain or cramping in the abdomen.  Vomiting.  Diarrhea. How is this diagnosed? This condition is usually diagnosed based on symptoms and a physical exam. How is this treated? Treatment is usually not needed. Symptoms often go away on their own. When treatment is recommended, it may involve:  Applying a cream or lotion to the bite area. This treatment helps with itching.  Taking an antibiotic medicine. This treatment is needed if the bite area gets infected.  Getting a tetanus shot, if you are not up to date on this vaccine.  Applying ice to the affected area.  Allergy medicines called antihistamines. This treatment may be needed if you develop itching or an allergic reaction to the insect bite.  Giving yourself an epinephrine injection if you have an anaphylactic reaction to a bite. To give the injection, you will use what is commonly called an auto-injector "pen"  (pre-filled automatic epinephrine injection device). Your health care provider will teach you how to use an auto-injector pen. Follow these instructions at home: Bite area care  Do not scratch the bite area.  Keep the bite area clean and dry. Wash it every day with soap and water as told by your health care provider.  Check the bite area every day for signs of infection. Check for: ? Redness, swelling, or pain. ? Fluid or blood. ? Warmth. ? Pus or a bad smell.   Managing pain, itching, and swelling  You may apply cortisone cream, calamine lotion, or a paste made of baking soda and water to the bite area as told by your health care provider.  If directed, put ice on the bite area. ? Put ice in a plastic bag. ? Place a towel between your skin and the bag. ? Leave the ice on for 20 minutes, 2-3 times a day.   General instructions  Apply or take over-the-counter and prescription medicines only  as told by your health care provider.  If you were prescribed an antibiotic medicine, take or apply it as told by your health care provider. Do not stop using the antibiotic even if your condition improves.  Keep all follow-up visits as told by your health care provider. This is important. How is this prevented? To help reduce your risk of insect bites:  When you are outdoors, wear clothing that covers your arms and legs. This is especially important in the early morning and evening.  Use insect repellent. The best insect repellents contain DEET, picaridin, oil of lemon eucalyptus (OLE), or IR3535.  Consider spraying your clothing with a pesticide called permethrin. Permethrin helps prevent insect bites. It works for several weeks and for up to 5-6 clothing washes. Do not apply permethrin directly to the skin.  If your home windows do not have screens, consider installing them.  If you will be sleeping in an area where there are mosquitoes, consider covering your sleeping area with a mosquito  net. Contact a health care provider if:  You have redness, swelling, or pain in the bite area.  You have fluid or blood coming from the bite area.  The bite area feels warm to the touch.  You have pus or a bad smell coming from the bite area.  You have a fever. Get help right away if:  You have joint pain.  You have a rash.  You feel unusually tired or sleepy.  You have neck pain.  You have a headache.  You have unusual weakness.  You develop symptoms of an anaphylactic reaction. These may include: ? Flushed skin. ? Hives. ? Swelling of the eyes, lips, face, mouth, tongue, or throat. ? Difficulty breathing, speaking, or swallowing. ? Wheezing. ? Dizziness or light-headedness. ? Fainting. ? Pain or cramping in the abdomen. ? Vomiting. ? Diarrhea. These symptoms may represent a serious problem that is an emergency. Do not wait to see if the symptoms will go away. Do the following right away:  Use the auto-injector pen as you have been instructed.  Get medical help. Call your local emergency services (911 in the [place] Do not drive yourself to the hospital. Summary  An insect bite can make your skin red, itchy, and swollen.  Treatment is usually not needed. Symptoms often go away on their own. When treatment is recommended, it may involve taking medicine, applying medicine to the area, or applying ice.  Apply or take over-the-counter and prescription medicines only as told by your health care provider.  Use insect repellent to help prevent insect bites.  Contact a health care provider if you have any signs of infection in the bite area. This information is not intended to replace advice given to you by your health care provider. Make sure you discuss any questions you have with your health care provider. Document Revised: 01/31/2018 Document Reviewed: 01/31/2018 Elsevier Patient Education  2021 [employer].

## 2020-10-19 NOTE — Progress Notes (Signed)
Chief Complaint  Patient presents with  . Facial Swelling    Left side face and eye.   . Eye Problem    Left eye swelling and itching    F/u 1. Left eye itching red, swelling, warm had bug bite to left lower jawline since Sunday with sx's and area was itching and left side of face/cheek swollen. She tried using systane drops to eyes w/o relief  2. No labs in 2+ years wants them today  3. Reports falls x 2 in 2016/2019 with fractures and h/o osteoporosis will repeat bone density  4. C/o left knee pian 8/10   Review of Systems  Constitutional: Positive for weight loss.  HENT: Negative for hearing loss.   Eyes: Negative for blurred vision.  Respiratory: Negative for shortness of breath.   Cardiovascular: Negative for chest pain.  Gastrointestinal: Negative for blood in stool.  Musculoskeletal: Positive for falls and joint pain.  Skin: Positive for rash.  Psychiatric/Behavioral: Negative for memory loss.   Past Medical History:  Diagnosis Date  . Arthritis    both knees  . Cataract    Surgery scheduled for 03/2014   Past Surgical History:  Procedure Laterality Date  . CHOLECYSTECTOMY    . FEMUR SURGERY  11/10/2014  . HIP ARTHROPLASTY Right 11/09/2017   Procedure: ARTHROPLASTY BIPOLAR HIP (HEMIARTHROPLASTY);  Surgeon: Earnestine Leys, MD;  Location: [facility] ORS;  Service: Orthopedics;  Laterality: Right;  . IR FLUORO GUIDED NEEDLE PLC ASPIRATION/INJECTION LOC  01/25/2017   Family History  Problem Relation Age of Onset  . Cancer Mother   . Heart disease Father 55       MI   Social History   Socioeconomic History  . Marital status: Widowed    Spouse name: Not on file  . Number of children: Not on file  . Years of education: Not on file  . Highest education level: Not on file  Occupational History  . Not on file  Tobacco Use  . Smoking status: Never Smoker  . Smokeless tobacco: Never Used  Vaping Use  . Vaping Use: Never used  Substance and Sexual Activity  . Alcohol use:  No    Alcohol/week: 0.0 standard drinks  . Drug use: No  . Sexual activity: Not Currently  Other Topics Concern  . 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    Lives with son    Children- 2    Grandchildren- none    Pets: 1 dog inside    Enjoys- Reading and mowing yard    Social Determinants of Radio broadcast assistant Strain: Low Risk   . Difficulty of Paying Living Expenses: Not hard at all  Food Insecurity: No Food Insecurity  . Worried About Charity fundraiser in the Last Year: Never true  . Ran Out of Food in the Last Year: Never true  Transportation Needs: No Transportation Needs  . Lack of Transportation (Medical): No  . Lack of Transportation (Non-Medical): No  Physical Activity: Not on file  Stress: No Stress Concern Present  . Feeling of Stress : Not at all  Social Connections: Not on file  Intimate Partner Violence: Not on file   Current Meds  Medication Sig  . Ascorbic Acid (VITAMIN C PO) Take by mouth.  Marland Kitchen CALCIUM PO Take by mouth.  . hydrocortisone 2.5 % lotion Apply topically 2 (two) times daily. Insect bite to face and eyelid itching  as needed  . Multiple Vitamin (MULTIVITAMIN PO) Take by mouth.  Vladimir Faster Glycol-Propyl Glycol (SYSTANE OP) Apply to eye.  . [DISCONTINUED] doxycycline (VIBRA-TABS) 100 MG tablet Take 1 tablet (100 mg total) by mouth 2 (two) times daily. With food x 7-10 days  . [DISCONTINUED] Olopatadine HCl 0.2 % SOLN Apply 1 drop to eye daily. Itching eyes   Allergies  Allergen Reactions  . Pollen Extract    Recent Results (from the past 2160 hour(s))  Comprehensive metabolic panel     Status: Abnormal   Collection Time: 10/19/20  1:44 PM  Result Value Ref Range   Sodium 143 135 - 145 mEq/L   Potassium 4.3 3.5 - 5.1 mEq/L   Chloride 105 96 - 112 mEq/L   CO2 33 (H) 19 - 32 mEq/L   Glucose, Bld 84 70 - 99 mg/dL   BUN 20 6 - 23 mg/dL   Creatinine, Ser 0.70 0.40 - 1.20  mg/dL   Total Bilirubin 0.6 0.2 - 1.2 mg/dL   Alkaline Phosphatase 100 39 - 117 U/L   AST 20 0 - 37 U/L   ALT 17 0 - 35 U/L   Total Protein 6.6 6.0 - 8.3 g/dL   Albumin 3.9 3.5 - 5.2 g/dL   GFR 79.03 >60.00 mL/min    Comment: Calculated using the CKD-EPI Creatinine Equation (2021)   Calcium 8.9 8.4 - 10.5 mg/dL  Lipid panel     Status: None   Collection Time: 10/19/20  1:44 PM  Result Value Ref Range   Cholesterol 190 0 - 200 mg/dL    Comment: ATP III Classification       Desirable:  < 200 mg/dL               Borderline High:  200 - 239 mg/dL          High:  > = 240 mg/dL   Triglycerides 54.0 0.0 - 149.0 mg/dL    Comment: Normal:  <150 mg/dLBorderline High:  150 - 199 mg/dL   HDL 81.50 >39.00 mg/dL   VLDL 10.8 0.0 - 40.0 mg/dL   LDL Cholesterol 97 0 - 99 mg/dL   Total CHOL/HDL Ratio 2     Comment:                Men          Women1/2 Average Risk     3.4          3.3Average Risk          5.0          4.42X Average Risk          9.6          7.13X Average Risk          15.0          11.0                       NonHDL 108.27     Comment: NOTE:  Non-HDL goal should be 30 mg/dL higher than patient's LDL goal (i.e. LDL goal of < 70 mg/dL, would have non-HDL goal of < 100 mg/dL)  CBC w/Diff     Status: None   Collection Time: 10/19/20  1:44 PM  Result Value Ref Range   WBC 6.7 4.0 - 10.5 K/uL   RBC 4.14 3.87 - 5.11 Mil/uL   Hemoglobin 12.5 12.0 - 15.0 g/dL   HCT 37.2 36.0 - 46.0 %  MCV 89.8 78.0 - 100.0 fl   MCHC 33.5 30.0 - 36.0 g/dL   RDW 13.1 11.5 - 15.5 %   Platelets 221.0 150.0 - 400.0 K/uL   Neutrophils Relative % 68.0 43.0 - 77.0 %   Lymphocytes Relative 21.4 12.0 - 46.0 %   Monocytes Relative 6.6 3.0 - 12.0 %   Eosinophils Relative 3.1 0.0 - 5.0 %   Basophils Relative 0.9 0.0 - 3.0 %   Neutro Abs 4.6 1.4 - 7.7 K/uL   Lymphs Abs 1.4 0.7 - 4.0 K/uL   Monocytes Absolute 0.4 0.1 - 1.0 K/uL   Eosinophils Absolute 0.2 0.0 - 0.7 K/uL   Basophils Absolute 0.1 0.0 - 0.1 K/uL   TSH     Status: Abnormal   Collection Time: 10/19/20  1:44 PM  Result Value Ref Range   TSH 12.74 (H) 0.35 - 4.50 uIU/mL  T4, free     Status: None   Collection Time: 10/19/20  1:44 PM  Result Value Ref Range   Free T4 0.68 0.60 - 1.60 ng/dL    Comment: Specimens from patients who are undergoing biotin therapy and /or ingesting biotin supplements may contain high levels of biotin.  The higher biotin concentration in these specimens interferes with this Free T4 assay.  Specimens that contain high levels  of biotin may cause false high results for this Free T4 assay.  Please interpret results in light of the total clinical presentation of the patient.    T3, free     Status: None   Collection Time: 10/19/20  1:44 PM  Result Value Ref Range   T3, Free 3.5 2.3 - 4.2 pg/mL  Vitamin B12     Status: None   Collection Time: 10/19/20  1:44 PM  Result Value Ref Range   Vitamin B-12 365 211 - 911 pg/mL   Objective  Body mass index is 26.15 kg/m. Wt Readings from Last 3 Encounters:  10/19/20 147 lb 9.6 oz (67 kg)  05/13/20 149 lb (67.6 kg)  05/09/18 149 lb 1.9 oz (67.6 kg)   Temp Readings from Last 3 Encounters:  10/19/20 98.2 F (36.8 C) (Oral)  05/09/18 98.1 F (36.7 C) (Oral)  11/27/17 98.4 F (36.9 C) (Oral)   BP Readings from Last 3 Encounters:  10/19/20 126/80  05/09/18 128/70  11/27/17 119/75   Pulse Readings from Last 3 Encounters:  10/19/20 78  05/09/18 69  11/27/17 89    Physical Exam Vitals and nursing note reviewed.  Constitutional:      Appearance: Normal appearance. She is well-developed and well-groomed.  HENT:     Head: Normocephalic and atraumatic.   Eyes:     Conjunctiva/sclera: Conjunctivae normal.     Pupils: Pupils are equal, round, and reactive to light.   Cardiovascular:     Rate and Rhythm: Normal rate and regular rhythm.     Heart sounds: Normal heart sounds. No murmur heard.   Pulmonary:     Effort: Pulmonary effort is normal.      Breath sounds: Normal breath sounds.  Skin:    General: Skin is warm and dry.  Neurological:     General: No focal deficit present.     Mental Status: She is alert and oriented to person, place, and time. Mental status is at baseline.     Gait: Gait normal.  Psychiatric:        Attention and Perception: Attention and perception normal.        Mood and  Affect: Mood and affect normal.        Speech: Speech normal.        Behavior: Behavior normal. Behavior is cooperative.        Thought Content: Thought content normal.        Cognition and Memory: Cognition and memory normal.        Judgment: Judgment normal.     Assessment  Plan  Eye swelling - Plan: doxycycline (VIBRA-TABS) 100 MG tablet,  Cellulitis of left orbital region - Plan: doxycycline (VIBRA-TABS) bid x 7-10 days Allergic conjunctivitis of both eyes - Plan: Olopatadine HCl 0.2 % qd prn  Eyelid dermatitis, allergic/contact - Plan: hydrocortisone 2.5 % lotion Insect bite of head, unspecified part, initial encounter - Plan: hydrocortisone 2.5 % lotion If not better f/u Dr. Tempie Hoist in [place] has upcoming appt   Elevated TSH likely hypothyroidism- Plan: TSH, T4, free, Thyroid peroxidase antibody, T3, free -consider levothyroxine and US thyroid   Hemorrhagic retina F/u Dr. Tempie Hoist injections monthly right eye  HM  Flu shot utd 3/3 covid shots  prevnar and pna 23 utd  Tdap and shingrix consider in future  Declines mammogram  dexa ordered h/o osteoporosis pending vitamin D --->consider med for tx in the future  Out of age window pap, colonoscopy  Provider: Dr. Olivia Mackie McLean-Scocuzza-[facility]

## 2020-10-20 ENCOUNTER — Other Ambulatory Visit (INDEPENDENT_AMBULATORY_CARE_PROVIDER_SITE_OTHER): Payer: PPO

## 2020-10-20 ENCOUNTER — Other Ambulatory Visit: Payer: Self-pay | Admitting: Internal Medicine

## 2020-10-20 DIAGNOSIS — E559 Vitamin D deficiency, unspecified: Secondary | ICD-10-CM | POA: Diagnosis not present

## 2020-10-20 DIAGNOSIS — N3000 Acute cystitis without hematuria: Secondary | ICD-10-CM

## 2020-10-20 LAB — URINALYSIS, ROUTINE W REFLEX MICROSCOPIC
Bilirubin, UA: NEGATIVE
Glucose, UA: NEGATIVE
Ketones, UA: NEGATIVE
Leukocytes,UA: NEGATIVE
Nitrite, UA: POSITIVE — AB
Protein,UA: NEGATIVE
Specific Gravity, UA: 1.019 (ref 1.005–1.030)
Urobilinogen, Ur: 0.2 mg/dL (ref 0.2–1.0)
pH, UA: 6 (ref 5.0–7.5)

## 2020-10-20 LAB — MICROSCOPIC EXAMINATION
Casts: NONE SEEN /lpf
RBC, Urine: NONE SEEN /hpf (ref 0–2)

## 2020-10-20 LAB — VITAMIN D 25 HYDROXY (VIT D DEFICIENCY, FRACTURES): VITD: 31.96 ng/mL (ref 30.00–100.00)

## 2020-10-20 LAB — IRON,TIBC AND FERRITIN PANEL
%SAT: 27 % (calc) (ref 16–45)
Ferritin: 44 ng/mL (ref 16–288)
Iron: 81 ug/dL (ref 45–160)
TIBC: 303 mcg/dL (calc) (ref 250–450)

## 2020-10-20 LAB — THYROID PEROXIDASE ANTIBODY: Thyroperoxidase Ab SerPl-aCnc: 481 IU/mL — ABNORMAL HIGH (ref <9)

## 2020-11-02 ENCOUNTER — Ambulatory Visit
Admission: RE | Admit: 2020-11-02 | Discharge: 2020-11-02 | Disposition: A | Discharge status: Home / Self Care | Payer: PPO | Source: Ambulatory Visit | Attending: Internal Medicine | Admitting: Internal Medicine

## 2020-11-02 ENCOUNTER — Other Ambulatory Visit: Payer: Self-pay

## 2020-11-02 DIAGNOSIS — M810 Age-related osteoporosis without current pathological fracture: Secondary | ICD-10-CM | POA: Diagnosis not present

## 2020-11-02 DIAGNOSIS — M85832 Other specified disorders of bone density and structure, left forearm: Secondary | ICD-10-CM | POA: Diagnosis not present

## 2020-11-02 DIAGNOSIS — Z780 Asymptomatic menopausal state: Secondary | ICD-10-CM | POA: Diagnosis not present

## 2020-12-05 ENCOUNTER — Ambulatory Visit: Payer: PPO | Admitting: Family

## 2020-12-13 ENCOUNTER — Encounter: Payer: Self-pay | Admitting: Family

## 2020-12-13 ENCOUNTER — Ambulatory Visit (INDEPENDENT_AMBULATORY_CARE_PROVIDER_SITE_OTHER): Payer: PPO | Admitting: Family

## 2020-12-13 ENCOUNTER — Other Ambulatory Visit: Payer: Self-pay

## 2020-12-13 VITALS — BP 124/70 | HR 83 | Temp 97.6°F | Ht 63.0 in | Wt 147.4 lb

## 2020-12-13 DIAGNOSIS — L989 Disorder of the skin and subcutaneous tissue, unspecified: Secondary | ICD-10-CM

## 2020-12-13 DIAGNOSIS — M810 Age-related osteoporosis without current pathological fracture: Secondary | ICD-10-CM

## 2020-12-13 DIAGNOSIS — R350 Frequency of micturition: Secondary | ICD-10-CM

## 2020-12-13 DIAGNOSIS — E039 Hypothyroidism, unspecified: Secondary | ICD-10-CM

## 2020-12-13 DIAGNOSIS — Z86718 Personal history of other venous thrombosis and embolism: Secondary | ICD-10-CM | POA: Diagnosis not present

## 2020-12-13 DIAGNOSIS — M8580 Other specified disorders of bone density and structure, unspecified site: Secondary | ICD-10-CM | POA: Diagnosis not present

## 2020-12-13 LAB — T3, FREE: T3, Free: 2.7 pg/mL (ref 2.3–4.2)

## 2020-12-13 LAB — URINALYSIS, ROUTINE W REFLEX MICROSCOPIC
Bilirubin Urine: NEGATIVE
Ketones, ur: NEGATIVE
Leukocytes,Ua: NEGATIVE
Nitrite: NEGATIVE
Specific Gravity, Urine: <=1.005 — AB (ref 1.000–1.030)
Total Protein, Urine: NEGATIVE
Urine Glucose: NEGATIVE
Urobilinogen, UA: 0.2 (ref 0.0–1.0)
pH: 5.5 (ref 5.0–8.0)

## 2020-12-13 LAB — TSH: TSH: 9.72 u[IU]/mL — ABNORMAL HIGH (ref 0.35–4.50)

## 2020-12-13 LAB — T4, FREE: Free T4: 0.75 ng/dL (ref 0.60–1.60)

## 2020-12-13 NOTE — Progress Notes (Signed)
Subjective:    Patient ID: Elizabeth Fernandez, female    DOB: 1933/11/26, 87 y.o.   MRN: [id number]  CC: [name] is a 87 y.o. female who presents today for follow up.   HPI:  She is using a cane and she feels unsteadty. She worries about falls. No recent falls.  Lives alone in her home, bedroom is downstairs. She has two dogs.  She mows her grass and works in her yard. No Sob, cp.    Chronic left leg swelling around sock. No calf pain. H/o left leg DVT  No h/o CKD.  Vitamin D 31 one month ago  Complains of urinary frequency for 1 year or more, unchanged. No fever, N, V.   Taking both calcium and vitamin D.  She is able to take large pills. No choking, trouble swallowing.  No planned dental procedure. Follow routinely with dentist.   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 hardware in left femur. H/o scoliosis  No alcohol use. Never smoker  DEXA 11/02/20 which didn't capture femur, or lumbar spine. Showed osteopenia  Has small skin colored macules bilateral legs. Non tender. No bleeding.  HISTORY:  Past Medical History:  Diagnosis Date  . Arthritis    both knees  . Cataract    Surgery scheduled for 03/2014   Past Surgical History:  Procedure Laterality Date  . CHOLECYSTECTOMY    . FEMUR SURGERY  11/10/2014  . HIP ARTHROPLASTY Right 11/09/2017   Procedure: ARTHROPLASTY BIPOLAR HIP (HEMIARTHROPLASTY);  Surgeon: Earnestine Leys, MD;  Location: [facility] ORS;  Service: Orthopedics;  Laterality: Right;  . IR FLUORO GUIDED NEEDLE PLC ASPIRATION/INJECTION LOC  01/25/2017   Family History  Problem Relation Age of Onset  . Cancer Mother   . Heart disease Father 76       MI    Allergies: Pollen extract Current Outpatient Medications on File Prior to Visit  Medication Sig Dispense Refill  . Ascorbic Acid (VITAMIN C PO) Take by mouth.    Marland Kitchen CALCIUM PO Take by mouth.    . meloxicam (MOBIC) 7.5  MG tablet Take 1 tablet (7.5 mg total) by mouth daily as needed for pain. 30 tablet 1  . Multiple Vitamin (MULTIVITAMIN PO) Take by mouth.    . doxycycline (VIBRA-TABS) 100 MG tablet Take 1 tablet (100 mg total) by mouth 2 (two) times daily. With food x 7-10 days (Patient not taking: Reported on 12/13/2020) 20 tablet 0  . hydrocortisone 2.5 % lotion Apply topically 2 (two) times daily. Insect bite to face and eyelid itching as needed (Patient not taking: Reported on 12/13/2020) 59 mL 0  . ibuprofen (ADVIL) 100 MG tablet Take 200 mg by mouth every 8 (eight) hours as needed for fever. (Patient not taking: No sig reported)    . Olopatadine HCl 0.2 % SOLN Apply 1 drop to eye daily. Itching eyes (Patient not taking: Reported on 12/13/2020) 2.5 mL 0  . Polyethyl Glycol-Propyl Glycol (SYSTANE OP) Apply to eye. (Patient not taking: Reported on 12/13/2020)     No current facility-administered medications on file prior to visit.    Social History   Tobacco Use  . Smoking status: Never Smoker  . Smokeless tobacco: Never Used  Vaping Use  . Vaping Use: Never used  Substance Use Topics  . Alcohol use: No    Alcohol/week: 0.0 standard drinks  . Drug use: No  Review of Systems  Constitutional: Negative for chills and fever.  Respiratory: Negative for cough and shortness of breath.   Cardiovascular: Positive for leg swelling. Negative for chest pain and palpitations.  Gastrointestinal: Negative for nausea and vomiting.      Objective:    BP 124/70 (BP Location: Left Arm, Patient Position: Sitting, Cuff Size: Normal)   Pulse 83   Temp 97.6 F (36.4 C) (Oral)   Ht 5\' 3"  (1.6 m)   Wt 147 lb 6.4 oz (66.9 kg)   SpO2 97%   BMI 26.11 kg/m  BP Readings from Last 3 Encounters:  12/13/20 124/70  10/19/20 126/80  05/09/18 128/70   Wt Readings from Last 3 Encounters:  12/13/20 147 lb 6.4 oz (66.9 kg)  10/19/20 147 lb 9.6 oz (67 kg)  05/13/20 149 lb (67.6 kg)    Physical Exam Vitals reviewed.   Constitutional:      Appearance: She is well-developed.  Eyes:     Conjunctiva/sclera: Conjunctivae normal.  Cardiovascular:     Rate and Rhythm: Normal rate and regular rhythm.     Pulses: Normal pulses.     Heart sounds: Normal heart sounds.     Comments: No LE edema, palpable cords or masses. No erythema or increased warmth. No asymmetry in calf size when compared bilaterally LE hair growth symmetric and present. No discoloration or varicosities noted. LE warm and palpable pedal pulses.  Pulmonary:     Effort: Pulmonary effort is normal.     Breath sounds: Normal breath sounds. No wheezing, rhonchi or rales.  Skin:    General: Skin is warm and dry.          Comments: Skin colored , non fluctuant macules noted BLE.   Neurological:     Mental Status: She is alert.  Psychiatric:        Speech: Speech normal.        Behavior: Behavior normal.        Thought Content: Thought content normal.        Assessment & Plan:   Problem List Items Addressed This Visit      Endocrine   Hypothyroidism - Primary   Relevant Orders   T3, free (Completed)   T4, free (Completed)   Thyrotropin receptor autoabs (Completed)   TSH (Completed)     Musculoskeletal and Integument   Osteopenia    Osteopenia. Reviewed DEXA report and FRAX risk. Discussed risks of biphosphonates including jaw necrosis, long bone fracture and esophagitis. Patient has no upcoming dental procedures and understand the importance of discussing medication with her dentist as well as letting me know if any dental , oral procedures beyond regular teeth cleaning.  Agreed to consult endocrine regarding appropriateness of starting bisphosphonate.         Other   History of DVT (deep vein thrombosis)    Benign exam. Pending repeat US left leg to complete history as she is not on anticoagulation and per patient, chart review, unable to see       Relevant Orders   US Venous Img Lower Unilateral Left   Leg lesion    Relevant Orders   Ambulatory referral to Dermatology    Other Visit Diagnoses    Urinary frequency       Relevant Orders   Urinalysis, Routine w reflex microscopic (Completed)   Urine Culture (Completed)       I am having Brent C. Mecham maintain her ibuprofen, meloxicam, Polyethyl Glycol-Propyl Glycol (SYSTANE OP), Multiple Vitamin (  MULTIVITAMIN PO), CALCIUM PO, Ascorbic Acid (VITAMIN C PO), doxycycline, Olopatadine HCl, and hydrocortisone.   No orders of the defined types were placed in this encounter.   Return precautions given.   Risks, benefits, and alternatives of the medications and treatment plan prescribed today were discussed, and patient expressed understanding.   Education regarding symptom management and diagnosis given to patient on AVS.  Continue to follow with Burnard Hawthorne, FNP for routine health maintenance.   Cassandria Santee and I agreed with plan.   Mable Paris, FNP

## 2020-12-13 NOTE — Patient Instructions (Addendum)
I have referred you to dermatology, and physical therapy ordered ultrasound of leg. Let us know if you dont hear back within a week in regards to an appointment being scheduled.     For post menopausal women, guidelines recommend a diet with 1200 mg of Calcium per day. If you are eating calcium rich foods, you do not need a calcium supplement. The body better absorbs the calcium that you eat over supplementation. If you do supplement, I recommend not supplementing the full 1200 mg/ day as this can lead to increased risk of cardiovascular disease. I recommend Calcium Citrate over the counter, and you may take a total of 600 to 800 mg per day in divided doses with meals for best absorption.   For bone health, you need adequate vitamin D, and I recommend you supplement as it is harder to do so with diet alone. I recommend cholecalciferol 800 units daily.  Also, please ensure you are following a diet high in calcium -- research shows better outcomes with dietary sources including kale, yogurt, broccolii, cheese, okra, almonds- to name a few.     Also remember that exercise is a great medicine for maintain and preserve bone health. Advise moderate exercise for 30 minutes , 3 times per week.

## 2020-12-13 NOTE — Assessment & Plan Note (Addendum)
Osteopenia. Reviewed DEXA report and FRAX risk. Discussed risks of biphosphonates including jaw necrosis, long bone fracture and esophagitis. Patient has no upcoming dental procedures and understand the importance of discussing medication with her dentist as well as letting me know if any dental , oral procedures beyond regular teeth cleaning.  Agreed to consult endocrine regarding appropriateness of starting bisphosphonate.

## 2020-12-14 LAB — URINE CULTURE
MICRO NUMBER:: 11871649
Result:: NO GROWTH
SPECIMEN QUALITY:: ADEQUATE

## 2020-12-14 LAB — SPECIMEN STATUS REPORT

## 2020-12-15 ENCOUNTER — Other Ambulatory Visit: Payer: Self-pay | Admitting: Family

## 2020-12-15 DIAGNOSIS — R7989 Other specified abnormal findings of blood chemistry: Secondary | ICD-10-CM

## 2020-12-15 DIAGNOSIS — M810 Age-related osteoporosis without current pathological fracture: Secondary | ICD-10-CM

## 2020-12-16 ENCOUNTER — Telehealth: Payer: Self-pay

## 2020-12-16 LAB — THYROTROPIN RECEPTOR AUTOABS

## 2020-12-16 NOTE — Telephone Encounter (Signed)
noted 

## 2020-12-16 NOTE — Telephone Encounter (Signed)
Patient stated that she would like to wait at this time. She said that she wasn't sure what insurance would pay & I told her unfortunately I didn't know either. Patient said that she would like to wait for now until she had other appointments & got some other things taken care of.

## 2020-12-16 NOTE — Telephone Encounter (Signed)
I think she did prefer Elizabeth Fernandez Can you call her and see if she is okay with the others?

## 2020-12-16 NOTE — Assessment & Plan Note (Signed)
Benign exam. Pending repeat US left leg to complete history as she is not on anticoagulation and per patient, chart review, unable to see

## 2020-12-16 NOTE — Telephone Encounter (Signed)
Kennyth Lose called from [facility] & stated that unfortunately they were not able to see patient at this time. They are under staffed &  Overloaded. These type of appointments for balance & gait training are 1 hr long. They are over a month out with even trying to be able to schedule. She said that it has been a struggle & she hated calling to let us know this.  Do you remember if patient requested [facility], or can she be referred to [facility], [place] or [facility].   Kennyth Lose did say that many places are most likely booked out & they are seeing a lot of ortho patient's due to them now being able to accommodate either.

## 2021-01-10 DIAGNOSIS — M170 Bilateral primary osteoarthritis of knee: Secondary | ICD-10-CM | POA: Diagnosis not present

## 2021-01-10 DIAGNOSIS — R2681 Unsteadiness on feet: Secondary | ICD-10-CM | POA: Diagnosis not present

## 2021-01-17 DIAGNOSIS — M170 Bilateral primary osteoarthritis of knee: Secondary | ICD-10-CM | POA: Diagnosis not present

## 2021-01-17 DIAGNOSIS — R2681 Unsteadiness on feet: Secondary | ICD-10-CM | POA: Diagnosis not present

## 2021-01-19 DIAGNOSIS — R2681 Unsteadiness on feet: Secondary | ICD-10-CM | POA: Diagnosis not present

## 2021-01-19 DIAGNOSIS — M170 Bilateral primary osteoarthritis of knee: Secondary | ICD-10-CM | POA: Diagnosis not present

## 2021-01-20 ENCOUNTER — Ambulatory Visit: Payer: PPO | Admitting: Family

## 2021-01-24 DIAGNOSIS — M170 Bilateral primary osteoarthritis of knee: Secondary | ICD-10-CM | POA: Diagnosis not present

## 2021-01-24 DIAGNOSIS — R2681 Unsteadiness on feet: Secondary | ICD-10-CM | POA: Diagnosis not present

## 2021-01-26 DIAGNOSIS — R2681 Unsteadiness on feet: Secondary | ICD-10-CM | POA: Diagnosis not present

## 2021-01-26 DIAGNOSIS — M170 Bilateral primary osteoarthritis of knee: Secondary | ICD-10-CM | POA: Diagnosis not present

## 2021-01-31 DIAGNOSIS — M170 Bilateral primary osteoarthritis of knee: Secondary | ICD-10-CM | POA: Diagnosis not present

## 2021-01-31 DIAGNOSIS — R2681 Unsteadiness on feet: Secondary | ICD-10-CM | POA: Diagnosis not present

## 2021-02-02 DIAGNOSIS — M170 Bilateral primary osteoarthritis of knee: Secondary | ICD-10-CM | POA: Diagnosis not present

## 2021-02-02 DIAGNOSIS — R2681 Unsteadiness on feet: Secondary | ICD-10-CM | POA: Diagnosis not present

## 2021-02-23 ENCOUNTER — Ambulatory Visit: Payer: PPO | Admitting: Endocrinology

## 2021-02-24 ENCOUNTER — Telehealth: Payer: Self-pay

## 2021-02-24 ENCOUNTER — Other Ambulatory Visit: Payer: Self-pay

## 2021-02-24 ENCOUNTER — Telehealth: Payer: Self-pay | Admitting: Family

## 2021-02-24 ENCOUNTER — Ambulatory Visit
Admission: RE | Admit: 2021-02-24 | Discharge: 2021-02-24 | Disposition: A | Discharge status: Home / Self Care | Payer: PPO | Source: Ambulatory Visit | Attending: Family | Admitting: Family

## 2021-02-24 DIAGNOSIS — I82A12 Acute embolism and thrombosis of left axillary vein: Secondary | ICD-10-CM

## 2021-02-24 DIAGNOSIS — Z86718 Personal history of other venous thrombosis and embolism: Secondary | ICD-10-CM | POA: Insufficient documentation

## 2021-02-24 DIAGNOSIS — R600 Localized edema: Secondary | ICD-10-CM | POA: Diagnosis not present

## 2021-02-24 DIAGNOSIS — I82432 Acute embolism and thrombosis of left popliteal vein: Secondary | ICD-10-CM | POA: Diagnosis not present

## 2021-02-24 NOTE — Telephone Encounter (Signed)
Spoke with patient Discussed left non occlusive chronc left popliteal vein which has not proprogated. Consulted with vascular on call Herbert Moors whom advised '81mg'$  ASA 3-6 months as we do not know how long she was on xarelto in 2018.    Advised to pt:   Start '81mg'$   Referral vascular She will et [place] know if you dont hear back within a week in regards to an appointment being scheduled.

## 2021-02-24 NOTE — Telephone Encounter (Signed)
[place] department called to let PCP know the results are back im pression is as followed.  IMPRESSION: 1. No evidence of acute DVT within left lower extremity. 2. 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

## 2021-03-02 ENCOUNTER — Ambulatory Visit (INDEPENDENT_AMBULATORY_CARE_PROVIDER_SITE_OTHER): Payer: PPO | Admitting: Endocrinology

## 2021-03-02 ENCOUNTER — Encounter: Payer: Self-pay | Admitting: Endocrinology

## 2021-03-02 ENCOUNTER — Other Ambulatory Visit: Payer: Self-pay

## 2021-03-02 DIAGNOSIS — M810 Age-related osteoporosis without current pathological fracture: Secondary | ICD-10-CM | POA: Diagnosis not present

## 2021-03-02 MED ORDER — LEVOTHYROXINE SODIUM 50 MCG PO TABS: 50.0000 ug | ORAL_TABLET | Freq: Every day | ORAL | 3 refills | Status: DC

## 2021-03-02 NOTE — Patient Instructions (Addendum)
Blood tests are requested for you today.  We'll let you know about the results.   Based on the results, I may be able to prescribe for you a once a month pill for the bones.   I have sent a prescription to your pharmacy, for a thyroid hormone pill.  You will probably need this for the rest of your life.   Please come back for a follow-up appointment in 6 months.

## 2021-03-02 NOTE — Progress Notes (Signed)
Subjective:    Patient ID: Elizabeth Fernandez, female    DOB: 30-Dec-1933, 87 y.o.   MRN: [id number]  HPI Pt is referred by Mable Paris, NP, for osteoporosis.  Pt was noted to have osteoporosis in 2018.  She has never been on medication for this.  She has no history of any of the following: multiple myeloma, renal dz, steroids, alcoholism, smoking, liver dz, gastric bypass, and hyperparathyroidism.  She does not take heparin or anticonvulsants.  She had menopause at age 13.  She takes Vit-D, 1500 units/d.  Past Medical History:  Diagnosis Date   Arthritis    both knees   Cataract    Surgery scheduled for 03/2014    Past Surgical History:  Procedure Laterality Date   CHOLECYSTECTOMY     FEMUR SURGERY  11/10/2014   HIP ARTHROPLASTY Right 11/09/2017   Procedure: ARTHROPLASTY BIPOLAR HIP (HEMIARTHROPLASTY);  Surgeon: Earnestine Leys, MD;  Location: [facility] ORS;  Service: Orthopedics;  Laterality: Right;   IR FLUORO GUIDED NEEDLE PLC ASPIRATION/INJECTION LOC  01/25/2017    Social History   Socioeconomic History   Marital status: Widowed    Spouse name: Not on file   Number of children: Not on file   Years of education: Not on file   Highest education level: Not on file  Occupational History   Not on file  Tobacco Use   Smoking status: Never   Smokeless tobacco: Never  Vaping Use   Vaping Use: Never used  Substance and Sexual Activity   Alcohol use: No    Alcohol/week: 0.0 standard drinks   Drug use: No   Sexual activity: Not Currently  Other Topics Concern   Not on file  Social History Narrative   May go back to work after recovery 2016; [employer].    Lives with son    Children- 2    Grandchildren- none    Pets: 1 dog inside    Enjoys- Reading and mowing yard    Social Determinants of Radio broadcast assistant Strain: Low Risk    Difficulty of Paying Living Expenses: Not hard at all  Food Insecurity: No Food Insecurity   Worried About Charity fundraiser in the  Last Year: Never true   Arboriculturist in the Last Year: Never true  Transportation Needs: No Transportation Needs   Lack of Transportation (Medical): No   Lack of Transportation (Non-Medical): No  Physical Activity: Not on file  Stress: No Stress Concern Present   Feeling of Stress : Not at all  Social Connections: Not on file  Intimate Partner Violence: Not on file    Current Outpatient Medications on File Prior to Visit  Medication Sig Dispense Refill   Ascorbic Acid (VITAMIN C PO) Take by mouth.     CALCIUM PO Take by mouth.     doxycycline (VIBRA-TABS) 100 MG tablet Take 1 tablet (100 mg total) by mouth 2 (two) times daily. With food x 7-10 days 20 tablet 0   hydrocortisone 2.5 % lotion Apply topically 2 (two) times daily. Insect bite to face and eyelid itching as needed 59 mL 0   ibuprofen (ADVIL) 100 MG tablet Take 200 mg by mouth every 8 (eight) hours as needed for fever.     meloxicam (MOBIC) 7.5 MG tablet Take 1 tablet (7.5 mg total) by mouth daily as needed for pain. 30 tablet 1   Multiple Vitamin (MULTIVITAMIN PO) Take by mouth.     Olopatadine  HCl 0.2 % SOLN Apply 1 drop to eye daily. Itching eyes 2.5 mL 0   Polyethyl Glycol-Propyl Glycol (SYSTANE OP) Apply to eye.     No current facility-administered medications on file prior to visit.    Allergies  Allergen Reactions   Pollen Extract     Family History  Problem Relation Age of Onset   Cancer Mother    Heart disease Father 19       MI   Osteoporosis Neg Hx     BP (!) 160/76 (BP Location: Right Arm, Patient Position: Sitting, Cuff Size: Normal)   Pulse 87   Ht _0  (1.6 m)   Wt 148 lb 12.8 oz (67.5 kg)   SpO2 95%   BMI 26.36 kg/m     Review of Systems denies weight loss, heartburn, recent falls, memory loss, and back pain.      Objective:   Physical Exam VITAL SIGNS:  See vs page GENERAL: no distress NECK: There is no palpable thyroid enlargement.  No thyroid nodule is palpable.  No palpable  lymphadenopathy at the anterior neck. SPINE: mild kyphosis GAIT: steady, with a cane   Lab Results  Component Value Date   CALCIUM 8.9 10/19/2020   Lab Results  Component Value Date   CREATININE 0.70 10/19/2020   BUN 20 10/19/2020   NA 143 10/19/2020   K 4.3 10/19/2020   CL 105 10/19/2020   CO2 33 (H) 10/19/2020   Lab Results  Component Value Date   TSH 9.72 (H) 12/13/2020   T3TOTAL 168 02/19/2017   T4TOTAL 8.2 02/19/2017   25-OH Vit-D=32 (2022)  CXR: no spinal fx  DEXA (2022): The BMD measured at Forearm Radius 33% is 0.670 g/cm2 with a T-score of -2.4 (was -2.5 in 2018)  I have reviewed outside records, and summarized: Pt was noted to have osteoporosis, and referred here.  She had R hip fx 2018, and left femur in 2016.  Both were traumatic.     Assessment & Plan:  Osteoporosis, new to me: uncontrolled Hypothyroidism: we discussed.  She requests rx  Patient Instructions  Blood tests are requested for you today.  We'll let you know about the results.   Based on the results, I may be able to prescribe for you a once a month pill for the bones.   I have sent a prescription to your pharmacy, for a thyroid hormone pill.  You will probably need this for the rest of your life.   Please come back for a follow-up appointment in 6 months.

## 2021-03-03 ENCOUNTER — Ambulatory Visit (INDEPENDENT_AMBULATORY_CARE_PROVIDER_SITE_OTHER): Payer: PPO | Admitting: Vascular Surgery

## 2021-03-03 ENCOUNTER — Other Ambulatory Visit: Payer: Self-pay

## 2021-03-03 VITALS — BP 138/73 | HR 78 | Ht 63.0 in | Wt 147.0 lb

## 2021-03-03 DIAGNOSIS — R600 Localized edema: Secondary | ICD-10-CM | POA: Diagnosis not present

## 2021-03-03 DIAGNOSIS — I82409 Acute embolism and thrombosis of unspecified deep veins of unspecified lower extremity: Secondary | ICD-10-CM | POA: Insufficient documentation

## 2021-03-03 DIAGNOSIS — I82532 Chronic embolism and thrombosis of left popliteal vein: Secondary | ICD-10-CM | POA: Diagnosis not present

## 2021-03-03 DIAGNOSIS — M7989 Other specified soft tissue disorders: Secondary | ICD-10-CM

## 2021-03-03 LAB — PTH, INTACT AND CALCIUM
Calcium: 9 mg/dL (ref 8.6–10.4)
PTH: 27 pg/mL (ref 16–77)

## 2021-03-03 MED ORDER — IBANDRONATE SODIUM 150 MG PO TABS: 150.0000 mg | ORAL_TABLET | ORAL | 3 refills | Status: DC

## 2021-03-03 NOTE — Progress Notes (Signed)
Patient ID: Elizabeth Fernandez, female   DOB: July 20, 1934, 87 y.o.   MRN: [id number]  Chief Complaint  Patient presents with   New Patient (Initial Visit)    NP consult chronic occlusive DVT involving the left popliteal vein image 28-30     HPI Elizabeth Fernandez is a 87 y.o. female.  I am asked to see the patient by M. Arnett, FNP for evaluation of chronic DVT of the left lower extremity with some swelling.  The patient reports having a DVT over 4 years ago.  She had major orthopedic surgery on that left leg and looking back at an ultrasound of April 2018 there is a chronic appearing DVT at that time.  This likely started with her femur surgery in 2016.  She was initially treated with anticoagulation which she tolerated.  Currently, she really does not have any pain in her legs.  She does have some mild swelling in the left lower extremity which is chronic and comes and goes.  She says today is about as bad as it gets and it is fairly mild.  No open wounds.  No fever or chills.  She had a recent ultrasound last week which I have reviewed.  This demonstrates chronic appearing thrombus in the left popliteal vein which is nonocclusive and appear similar to the study from 2018.  The patient reports she does wear compression stockings regularly.  She says this does help keep her swelling down.   Past Medical History:  Diagnosis Date   Arthritis    both knees   Cataract    Surgery scheduled for 03/2014    Past Surgical History:  Procedure Laterality Date   CHOLECYSTECTOMY     FEMUR SURGERY  11/10/2014   HIP ARTHROPLASTY Right 11/09/2017   Procedure: ARTHROPLASTY BIPOLAR HIP (HEMIARTHROPLASTY);  Surgeon: Elizabeth Leys, MD;  Location: [facility] ORS;  Service: Orthopedics;  Laterality: Right;   IR FLUORO GUIDED NEEDLE PLC ASPIRATION/INJECTION LOC  01/25/2017     Family History  Problem Relation Age of Onset   Cancer Mother    Heart disease Father 33       MI   Osteoporosis Neg Hx   No bleeding or  clotting   Social History   Tobacco Use   Smoking status: Never   Smokeless tobacco: Never  Vaping Use   Vaping Use: Never used  Substance Use Topics   Alcohol use: No    Alcohol/week: 0.0 standard drinks   Drug use: No     Allergies  Allergen Reactions   Pollen Extract     Current Outpatient Medications  Medication Sig Dispense Refill   Ascorbic Acid (VITAMIN C PO) Take by mouth.     CALCIUM PO Take by mouth.     doxycycline (VIBRA-TABS) 100 MG tablet Take 1 tablet (100 mg total) by mouth 2 (two) times daily. With food x 7-10 days 20 tablet 0   hydrocortisone 2.5 % lotion Apply topically 2 (two) times daily. Insect bite to face and eyelid itching as needed 59 mL 0   ibuprofen (ADVIL) 100 MG tablet Take 200 mg by mouth every 8 (eight) hours as needed for fever.     levothyroxine (SYNTHROID) 50 MCG tablet Take 1 tablet (50 mcg total) by mouth daily. 90 tablet 3   meloxicam (MOBIC) 7.5 MG tablet Take 1 tablet (7.5 mg total) by mouth daily as needed for pain. 30 tablet 1   Multiple Vitamin (MULTIVITAMIN PO) Take by mouth.  Olopatadine HCl 0.2 % SOLN Apply 1 drop to eye daily. Itching eyes 2.5 mL 0   Polyethyl Glycol-Propyl Glycol (SYSTANE OP) Apply to eye.     No current facility-administered medications for this visit.      REVIEW OF SYSTEMS (Negative unless checked)  Constitutional: '[]'$ Weight loss  '[]'$ Fever  '[]'$ Chills Cardiac: '[]'$ Chest pain   '[]'$ Chest pressure   '[]'$ Palpitations   '[]'$ Shortness of breath when laying flat   '[]'$ Shortness of breath at rest   '[]'$ Shortness of breath with exertion. Vascular:  '[]'$ Pain in legs with walking   '[]'$ Pain in legs at rest   '[]'$ Pain in legs when laying flat   '[]'$ Claudication   '[]'$ Pain in feet when walking  '[]'$ Pain in feet at rest  '[]'$ Pain in feet when laying flat   '[x]'$ History of DVT   '[x]'$ Phlebitis   '[]'$ Swelling in legs   '[]'$ Varicose veins   '[]'$ Non-healing ulcers Pulmonary:   '[]'$ Uses home oxygen   '[]'$ Productive cough   '[]'$ Hemoptysis   '[]'$ Wheeze  '[]'$ COPD    '[]'$ Asthma Neurologic:  '[]'$ Dizziness  '[]'$ Blackouts   '[]'$ Seizures   '[]'$ History of stroke   '[]'$ History of TIA  '[]'$ Aphasia   '[]'$ Temporary blindness   '[]'$ Dysphagia   '[]'$ Weakness or numbness in arms   '[]'$ Weakness or numbness in legs Musculoskeletal:  '[x]'$ Arthritis   '[]'$ Joint swelling   '[]'$ Joint pain   '[]'$ Low back pain Hematologic:  '[]'$ Easy bruising  '[]'$ Easy bleeding   '[]'$ Hypercoagulable state   '[]'$ Anemic  '[]'$ Hepatitis Gastrointestinal:  '[]'$ Blood in stool   '[]'$ Vomiting blood  '[]'$ Gastroesophageal reflux/heartburn   '[]'$ Abdominal pain Genitourinary:  '[]'$ Chronic kidney disease   '[]'$ Difficult urination  '[]'$ Frequent urination  '[]'$ Burning with urination   '[]'$ Hematuria Skin:  '[]'$ Rashes   '[]'$ Ulcers   '[]'$ Wounds Psychological:  '[]'$ History of anxiety   '[]'$  History of major depression.    Physical Exam BP 138/73   Pulse 78   Ht '5\' 3"'$  (1.6 m)   Wt 147 lb (66.7 kg)   BMI 26.04 kg/m  Gen:  WD/WN, NAD. Appears younger than stated age. Head: [place]/AT, No temporalis wasting.  Ear/Nose/Throat: Hearing grossly intact, nares w/o erythema or drainage, oropharynx w/o Erythema/Exudate Eyes: Conjunctiva clear, sclera non-icteric  Neck: trachea midline.  No JVD.  Pulmonary:  Good air movement, respirations not labored, no use of accessory muscles  Cardiac: RRR, no JVD Vascular:  Vessel Right Left  Radial Palpable Palpable                          DP 2+ 1+  PT 2+ 1+   Gastrointestinal:. No masses, surgical incisions, or scars. Musculoskeletal: M/S 5/5 throughout.  Extremities without ischemic changes.  No deformity or atrophy. 1+ LLE edema. Neurologic: Sensation grossly intact in extremities.  Symmetrical.  Speech is fluent. Motor exam as listed above. Psychiatric: Judgment intact, Mood & affect appropriate for pt's clinical situation. Dermatologic: No rashes or ulcers noted.  No cellulitis or open wounds.    Radiology US Venous Img Lower Unilateral Left  Result Date: 02/24/2021 CLINICAL DATA:  Left lower extremity edema. History of  previous DVT. Evaluate for acute or chronic DVT. EXAM: LEFT LOWER EXTREMITY VENOUS DOPPLER ULTRASOUND TECHNIQUE: Gray-scale sonography with graded compression, as well as color Doppler and duplex ultrasound were performed to evaluate the lower extremity deep venous systems from the level of the common femoral vein and including the common femoral, femoral, profunda femoral, popliteal and calf veins including the posterior tibial, peroneal and gastrocnemius veins when visible. The superficial great saphenous vein was  also interrogated. Spectral Doppler was utilized to evaluate flow at rest and with distal augmentation maneuvers in the common femoral, femoral and popliteal veins. COMPARISON:  Bilateral lower extremity venous Doppler ultrasound-03/26/2017 (positive for DVT within the left popliteal and tibial veins) FINDINGS: Contralateral Common Femoral Vein: Respiratory phasicity is normal and symmetric with the symptomatic side. No evidence of thrombus. Normal compressibility. Common Femoral Vein: No evidence of thrombus. Normal compressibility, respiratory phasicity and response to augmentation. Saphenofemoral Junction: No evidence of thrombus. Normal compressibility and flow on color Doppler imaging. Profunda Femoral Vein: No evidence of thrombus. Normal compressibility and flow on color Doppler imaging. Femoral Vein: No evidence of thrombus. Normal compressibility, respiratory phasicity and response to augmentation. Popliteal Vein: Chronic occlusive DVT involving the left popliteal vein (image 28 through 30) Calf Veins: The posterior tibial veins appear patent where imaged. The peroneal vein was not visualized. Superficial Great Saphenous Vein: No evidence of thrombus. Normal compressibility. Other Findings:  None. IMPRESSION: 1. No evidence of acute DVT within left lower extremity. 2. 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  Electronically Signed   By: Sandi Mariscal M.D.   On: 02/24/2021 11:05    Labs Recent Results (from the past 2160 hour(s))  T3, free     Status: None   Collection Time: 12/13/20 12:20 PM  Result Value Ref Range   T3, Free 2.7 2.3 - 4.2 pg/mL  T4, free     Status: None   Collection Time: 12/13/20 12:20 PM  Result Value Ref Range   Free T4 0.75 0.60 - 1.60 ng/dL    Comment: Specimens from patients who are undergoing biotin therapy and /or ingesting biotin supplements may contain high levels of biotin.  The higher biotin concentration in these specimens interferes with this Free T4 assay.  Specimens that contain high levels  of biotin may cause false high results for this Free T4 assay.  Please interpret results in light of the total clinical presentation of the patient.    Thyrotropin receptor autoabs     Status: None   Collection Time: 12/13/20 12:20 PM  Result Value Ref Range   Thyrotropin Receptor Ab CANCELED IU/L    Comment: Test not performed. Specimen not received at refrigerated temperature.  Result canceled by the ancillary.   TSH     Status: Abnormal   Collection Time: 12/13/20 12:20 PM  Result Value Ref Range   TSH 9.72 (H) 0.35 - 4.50 uIU/mL  Urinalysis, Routine w reflex microscopic     Status: Abnormal   Collection Time: 12/13/20 12:20 PM  Result Value Ref Range   Color, Urine YELLOW Yellow;Lt. Yellow;Straw;Dark Yellow;Amber;Green;Red;Brown   APPearance CLEAR Clear;Turbid;Slightly Cloudy;Cloudy   Specific Gravity, Urine <=1.005 (A) 1.000 - 1.030   pH 5.5 5.0 - 8.0   Total Protein, Urine NEGATIVE Negative   Urine Glucose NEGATIVE Negative   Ketones, ur NEGATIVE Negative   Bilirubin Urine NEGATIVE Negative   Hgb urine dipstick TRACE-INTACT (A) Negative   Urobilinogen, UA 0.2 0.0 - 1.0   Leukocytes,Ua NEGATIVE Negative   Nitrite NEGATIVE Negative   WBC, UA 0-2/hpf 0-2/hpf   RBC / HPF 0-2/hpf 0-2/hpf   Squamous Epithelial / LPF Rare(0-4/hpf) Rare(0-4/hpf)  Urine  Culture     Status: None   Collection Time: 12/13/20 12:20 PM   Specimen: Urine  Result Value Ref Range   MICRO NUMBER: [id number]    SPECIMEN QUALITY: Adequate    Sample Source NOT  GIVEN    STATUS: FINAL    Result: No Growth   Specimen status report     Status: None (Preliminary result)   Collection Time: 12/13/20 12:20 PM  Result Value Ref Range   specimen status report Comment     Comment: NTI Serum Gel Tube    Assessment/Plan:  DVT (deep venous thrombosis) ([place]) She had a recent ultrasound last week which I have reviewed.  This demonstrates chronic appearing thrombus in the left popliteal vein which is nonocclusive and appear similar to the study from 2018.  We had a long discussion today regarding the duplex findings.  This is not entirely unexpected we will likely see chronic DVT on duplex indefinitely going forward.  She reports some mild swelling and manages this well with compression socks and elevation.  She reports no significant pain.  We did discuss that she will be at increased risk of DVT over the general population, but she does not require anticoagulation currently.  She just has to have a heightened awareness and get an ultrasound with any worsening pain or swelling.  She voices her understanding and I will see her back as needed.  Left leg swelling Likely some postphlebitic symptoms.  These are relatively mild and well controlled.      Leotis Pain 03/03/2021, 11:36 AM   This note was created with Dragon medical transcription system.  Any errors from dictation are unintentional.

## 2021-03-03 NOTE — Assessment & Plan Note (Signed)
She had a recent ultrasound last week which I have reviewed.  This demonstrates chronic appearing thrombus in the left popliteal vein which is nonocclusive and appear similar to the study from 2018.  We had a long discussion today regarding the duplex findings.  This is not entirely unexpected we will likely see chronic DVT on duplex indefinitely going forward.  She reports some mild swelling and manages this well with compression socks and elevation.  She reports no significant pain.  We did discuss that she will be at increased risk of DVT over the general population, but she does not require anticoagulation currently.  She just has to have a heightened awareness and get an ultrasound with any worsening pain or swelling.  She voices her understanding and I will see her back as needed.

## 2021-03-03 NOTE — Assessment & Plan Note (Signed)
Likely some postphlebitic symptoms.  These are relatively mild and well controlled.

## 2021-03-15 ENCOUNTER — Other Ambulatory Visit: Payer: Self-pay

## 2021-03-15 ENCOUNTER — Encounter: Payer: Self-pay | Admitting: Family

## 2021-03-15 ENCOUNTER — Ambulatory Visit (INDEPENDENT_AMBULATORY_CARE_PROVIDER_SITE_OTHER): Payer: PPO | Admitting: Family

## 2021-03-15 DIAGNOSIS — E039 Hypothyroidism, unspecified: Secondary | ICD-10-CM | POA: Diagnosis not present

## 2021-03-15 DIAGNOSIS — M810 Age-related osteoporosis without current pathological fracture: Secondary | ICD-10-CM

## 2021-03-15 DIAGNOSIS — I82562 Chronic embolism and thrombosis of left calf muscular vein: Secondary | ICD-10-CM

## 2021-03-15 LAB — TSH: TSH: 7.21 u[IU]/mL — ABNORMAL HIGH (ref 0.35–5.50)

## 2021-03-15 NOTE — Assessment & Plan Note (Signed)
Chronic, stable.  Compliant with Boniva as prescribed by Dr. Loanne Drilling.  Will follow

## 2021-03-15 NOTE — Progress Notes (Signed)
Subjective:    Patient ID: Elizabeth Fernandez, female    DOB: 01-05-1934, 87 y.o.   MRN: [id number]  CC: Elizabeth Fernandez is a 87 y.o. female who presents today for follow up.   HPI: Feels well today.  No new complaints  She is staying active and she mowed back yard yesterday. No cp.   hypothyroidism-compliant with 50 MCG Synthroid   she had consult with Dr. Lucky Cowboy for left leg swelling, 03/03/2021 for chronic appearing thrombus in the left popliteal vein which is nonocclusive, similar to study in 2018.  Per patient, advised to stay on asa '81mg'$ . No bleeding.  No falls.   She also had consult with Dr. Loanne Drilling for osteoporosis 03/02/21 , who is prescribing Boniva. Compliant with vitamin and calcium.   Due shingrex  HISTORY:  Past Medical History:  Diagnosis Date   Arthritis    both knees   Cataract    Surgery scheduled for 03/2014   Past Surgical History:  Procedure Laterality Date   CHOLECYSTECTOMY     FEMUR SURGERY  11/10/2014   HIP ARTHROPLASTY Right 11/09/2017   Procedure: ARTHROPLASTY BIPOLAR HIP (HEMIARTHROPLASTY);  Surgeon: Earnestine Leys, MD;  Location: [facility] ORS;  Service: Orthopedics;  Laterality: Right;   IR FLUORO GUIDED NEEDLE PLC ASPIRATION/INJECTION LOC  01/25/2017   Family History  Problem Relation Age of Onset   Cancer Mother    Heart disease Father 52       MI   Osteoporosis Neg Hx     Allergies: Pollen extract Current Outpatient Medications on File Prior to Visit  Medication Sig Dispense Refill   Ascorbic Acid (VITAMIN C PO) Take by mouth.     CALCIUM PO Take by mouth.     hydrocortisone 2.5 % lotion Apply topically 2 (two) times daily. Insect bite to face and eyelid itching as needed 59 mL 0   ibandronate (BONIVA) 150 MG tablet Take 1 tablet (150 mg total) by mouth every 30 (thirty) days. Take in the morning with a full glass of water, on an empty stomach, and do not take anything else by mouth or lie down for the next 30 min. 3 tablet 3   ibuprofen (ADVIL) 100 MG  tablet Take 200 mg by mouth every 8 (eight) hours as needed for fever.     levothyroxine (SYNTHROID) 50 MCG tablet Take 1 tablet (50 mcg total) by mouth daily. 90 tablet 3   meloxicam (MOBIC) 7.5 MG tablet Take 1 tablet (7.5 mg total) by mouth daily as needed for pain. 30 tablet 1   Multiple Vitamin (MULTIVITAMIN PO) Take by mouth.     doxycycline (VIBRA-TABS) 100 MG tablet Take 1 tablet (100 mg total) by mouth 2 (two) times daily. With food x 7-10 days (Patient not taking: Reported on 03/15/2021) 20 tablet 0   Olopatadine HCl 0.2 % SOLN Apply 1 drop to eye daily. Itching eyes (Patient not taking: Reported on 03/15/2021) 2.5 mL 0   Polyethyl Glycol-Propyl Glycol (SYSTANE OP) Apply to eye. (Patient not taking: Reported on 03/15/2021)     No current facility-administered medications on file prior to visit.    Social History   Tobacco Use   Smoking status: Never   Smokeless tobacco: Never  Vaping Use   Vaping Use: Never used  Substance Use Topics   Alcohol use: No    Alcohol/week: 0.0 standard drinks   Drug use: No    Review of Systems    Objective:    BP 112/64 (  BP Location: Left Arm, Patient Position: Sitting, Cuff Size: Large)   Pulse 78   Temp 98.2 F (36.8 C) (Oral)   Ht '5\' 3"'$  (1.6 m)   Wt 149 lb 12.8 oz (67.9 kg)   SpO2 97%   BMI 26.54 kg/m  BP Readings from Last 3 Encounters:  03/15/21 112/64  03/03/21 138/73  03/02/21 (!) 160/76   Wt Readings from Last 3 Encounters:  03/15/21 149 lb 12.8 oz (67.9 kg)  03/03/21 147 lb (66.7 kg)  03/02/21 148 lb 12.8 oz (67.5 kg)    Physical Exam     Assessment & Plan:   Problem List Items Addressed This Visit   None    I am having Elizabeth Fernandez maintain her ibuprofen, meloxicam, Polyethyl Glycol-Propyl Glycol (SYSTANE OP), Multiple Vitamin (MULTIVITAMIN PO), CALCIUM PO, Ascorbic Acid (VITAMIN C PO), doxycycline, Olopatadine HCl, hydrocortisone, levothyroxine, and ibandronate.   No orders of the defined types were placed  in this encounter.   Return precautions given.   Risks, benefits, and alternatives of the medications and treatment plan prescribed today were discussed, and patient expressed understanding.   Education regarding symptom management and diagnosis given to patient on AVS.  Continue to follow with Burnard Hawthorne, FNP for routine health maintenance.   Cassandria Santee and I agreed with plan.   Mable Paris, FNP

## 2021-03-15 NOTE — Patient Instructions (Addendum)
Pharmacy for shingrex vaccine  Nice to see you!

## 2021-03-15 NOTE — Assessment & Plan Note (Addendum)
Chronic. Pending tsh.  Continue Synthroid 50 MCG

## 2021-03-15 NOTE — Assessment & Plan Note (Signed)
Chronic, Stable.  Reviewed vascular note.  Advised patient to stay on aspirin 81 mg indefinitely in the absence of bleeding, falls.

## 2021-03-20 ENCOUNTER — Other Ambulatory Visit: Payer: Self-pay | Admitting: Family

## 2021-04-24 ENCOUNTER — Ambulatory Visit: Payer: PPO | Admitting: Dermatology

## 2021-04-24 ENCOUNTER — Other Ambulatory Visit: Payer: Self-pay

## 2021-04-24 DIAGNOSIS — L578 Other skin changes due to chronic exposure to nonionizing radiation: Secondary | ICD-10-CM

## 2021-04-24 DIAGNOSIS — D489 Neoplasm of uncertain behavior, unspecified: Secondary | ICD-10-CM

## 2021-04-24 DIAGNOSIS — C44619 Basal cell carcinoma of skin of left upper limb, including shoulder: Secondary | ICD-10-CM

## 2021-04-24 DIAGNOSIS — L820 Inflamed seborrheic keratosis: Secondary | ICD-10-CM | POA: Diagnosis not present

## 2021-04-24 NOTE — Patient Instructions (Addendum)
Biopsy Wound Care Instructions  Leave the original bandage on for 24 hours if possible.  If the bandage becomes soaked or soiled before that time, it is OK to remove it and examine the wound.  A small amount of post-operative bleeding is normal.  If excessive bleeding occurs, remove the bandage, place gauze over the site and apply continuous pressure (no peeking) over the area for 30 minutes. If this does not work, please call our clinic as soon as possible or page your doctor if it is after hours.   Once a day, cleanse the wound with soap and water. It is fine to shower. If a thick crust develops you may use a Q-tip dipped into dilute hydrogen peroxide (mix 1:1 with water) to dissolve it.  Hydrogen peroxide can slow the healing process, so use it only as needed.    After washing, apply petroleum jelly (Vaseline) or an antibiotic ointment if your doctor prescribed one for you, followed by a bandage.    For best healing, the wound should be covered with a layer of ointment at all times. If you are not able to keep the area covered with a bandage to hold the ointment in place, this may mean re-applying the ointment several times a day.  Continue this wound care until the wound has healed and is no longer open.   Itching and mild discomfort is normal during the healing process. However, if you develop pain or severe itching, please call our office.   If you have any discomfort, you can take Tylenol (acetaminophen) or ibuprofen as directed on the bottle. (Please do not take these if you have an allergy to them or cannot take them for another reason).  Some redness, tenderness and white or yellow material in the wound is normal healing.  If the area becomes very sore and red, or develops a thick yellow-green material (pus), it may be infected; please notify us.    If you have stitches, return to clinic as directed to have the stitches removed. You will continue wound care for 2-3 days after the stitches  are removed.   Wound healing continues for up to one year following surgery. It is not unusual to experience pain in the scar from time to time during the interval.  If the pain becomes severe or the scar thickens, you should notify the office.    A slight amount of redness in a scar is expected for the first six months.  After six months, the redness will fade and the scar will soften and fade.  The color difference becomes less noticeable with time.  If there are any problems, return for a post-op surgery check at your earliest convenience.  To improve the appearance of the scar, you can use silicone scar gel, cream, or sheets (such as Mederma or Serica) every night for up to one year. These are available over the counter (without a prescription).  Please call our office at [phone] for any questions or concerns.    Seborrheic Keratosis  What causes seborrheic keratoses? Seborrheic keratoses are harmless, common skin growths that first appear during adult life.  As time goes by, more growths appear.  Some people may develop a large number of them.  Seborrheic keratoses appear on both covered and uncovered body parts.  They are not caused by sunlight.  The tendency to develop seborrheic keratoses can be inherited.  They vary in color from skin-colored to gray, brown, or even black.  They can  be either smooth or have a rough, warty surface.   Seborrheic keratoses are superficial and look as if they were stuck on the skin.  Under the microscope this type of keratosis looks like layers upon layers of skin.  That is why at times the top layer may seem to fall off, but the rest of the growth remains and re-grows.    Treatment Seborrheic keratoses do not need to be treated, but can easily be removed in the office.  Seborrheic keratoses often cause symptoms when they rub on clothing or jewelry.  Lesions can be in the way of shaving.  If they become inflamed, they can cause itching, soreness, or  burning.  Removal of a seborrheic keratosis can be accomplished by freezing, burning, or surgery. If any spot bleeds, scabs, or grows rapidly, please return to have it checked, as these can be an indication of a skin cancer.  Cryotherapy Aftercare  Wash gently with soap and water everyday.   Apply Vaseline and Band-Aid daily until healed.      If you have any questions or concerns for your doctor, please call our main line at [phone] and press option 4 to reach your doctor's medical assistant. If no one answers, please leave a voicemail as directed and we will return your call as soon as possible. Messages left after 4 pm will be answered the following business day.   You may also send [place] a message via [place]. We typically respond to MyChart messages within 1-2 business days.  For prescription refills, please ask your pharmacy to contact our office. Our fax number is [phone].  If you have an urgent issue when the clinic is closed that cannot wait until the next business day, you can page your doctor at the number below.    Please note that while we do our best to be available for urgent issues outside of office hours, we are not available 24/7.   If you have an urgent issue and are unable to reach [place], you may choose to seek medical care at your doctor's office, retail clinic, urgent care center, or emergency room.  If you have a medical emergency, please immediately call 911 or go to the emergency department.  Pager Numbers  - Dr. Nehemiah Massed: [phone]  - Dr. Laurence Ferrari: [phone]  - Dr. Nicole Kindred: [phone]  In the event of inclement weather, please call our main line at [phone] for an update on the status of any delays or closures.  Dermatology Medication Tips: Please keep the boxes that topical medications come in in order to help keep track of the instructions about where and how to use these. Pharmacies typically print the medication instructions only on the  boxes and not directly on the medication tubes.   If your medication is too expensive, please contact our office at [phone] option 4 or send [place] a message through [place].   We are unable to tell what your co-pay for medications will be in advance as this is different depending on your insurance coverage. However, we may be able to find a substitute medication at lower cost or fill out paperwork to get insurance to cover a needed medication.   If a prior authorization is required to get your medication covered by your insurance company, please allow [place] 1-2 business days to complete this process.  Drug prices often vary depending on where the prescription is filled and some pharmacies may offer cheaper prices.  The website www.goodrx.com contains coupons for medications  through different pharmacies. The prices here do not account for what the cost may be with help from insurance (it may be cheaper with your insurance), but the website can give you the price if you did not use any insurance.  - You can print the associated coupon and take it with your prescription to the pharmacy.  - You may also stop by our office during regular business hours and pick up a GoodRx coupon card.  - If you need your prescription sent electronically to a different pharmacy, notify our office through [facility] or by phone at [phone] option 4.

## 2021-04-24 NOTE — Progress Notes (Signed)
   New Patient Visit  Subjective  Elizabeth Fernandez is a 87 y.o. female who presents for the following: New Patient (Initial Visit) (Patient here today with concerns with places that popping up on lower legs and arms. Patient states she has been dealing with for a long time. Patient denies personal or family history of skin cancer. Patient also reports a spot at left upper arm that doesn't heal. ).  The following portions of the chart were reviewed this encounter and updated as appropriate:   Tobacco  Allergies  Meds  Problems  Med Hx  Surg Hx  Fam Hx     Review of Systems:  No other skin or systemic complaints except as noted in HPI or Assessment and Plan.  Objective  Well appearing patient in no apparent distress; mood and affect are within normal limits.  A focused examination was performed including arms, legs, fingers, face. Relevant physical exam findings are noted in the Assessment and Plan.  left distal lateral deltoid 2.5 cm x 1 cm crusted red plaque        legs, fingers, arms x 15 (15) Erythematous keratotic or waxy stuck-on papule or plaque.    Assessment & Plan  Neoplasm of uncertain behavior left distal lateral deltoid  Skin / nail biopsy Type of biopsy: tangential   Informed consent: discussed and consent obtained   Timeout: patient name, date of birth, surgical site, and procedure verified   Procedure prep:  Patient was prepped and draped in usual sterile fashion Prep type:  Isopropyl alcohol Anesthesia: the lesion was anesthetized in a standard fashion   Anesthetic:  1% lidocaine w/ epinephrine 1-100,000 buffered w/ 8.4% NaHCO3 Instrument used: flexible razor blade   Hemostasis achieved with: pressure, aluminum chloride and electrodesiccation   Outcome: patient tolerated procedure well   Post-procedure details: sterile dressing applied and wound care instructions given   Dressing type: petrolatum and bandage    Specimen 1 - Surgical  pathology Differential Diagnosis: r/o ca   Check Margins: No  Inflamed seborrheic keratosis legs, fingers, arms x 15  Destruction of lesion - legs, fingers, arms x 15 Complexity: simple   Destruction method: cryotherapy   Informed consent: discussed and consent obtained   Timeout:  patient name, date of birth, surgical site, and procedure verified Lesion destroyed using liquid nitrogen: Yes   Region frozen until ice ball extended beyond lesion: Yes   Outcome: patient tolerated procedure well with no complications   Post-procedure details: wound care instructions given    Actinic Damage - chronic, secondary to cumulative UV radiation exposure/sun exposure over time - diffuse scaly erythematous macules with underlying dyspigmentation - Recommend daily broad spectrum sunscreen SPF 30+ to sun-exposed areas, reapply every 2 hours as needed.  - Recommend staying in the shade or wearing long sleeves, sun glasses (UVA+UVB protection) and wide brim hats (4-inch brim around the entire circumference of the hat). - Call for new or changing lesions.  Return if symptoms worsen or fail to improve.  IRuthell Rummage, CMA, am acting as scribe for Sarina Ser, MD.  Documentation: I have reviewed the above documentation for accuracy and completeness, and I agree with the above.  Sarina Ser, MD

## 2021-04-25 ENCOUNTER — Encounter: Payer: Self-pay | Admitting: Dermatology

## 2021-04-28 DIAGNOSIS — C4491 Basal cell carcinoma of skin, unspecified: Secondary | ICD-10-CM

## 2021-04-28 HISTORY — DX: Basal cell carcinoma of skin, unspecified: C44.91

## 2021-05-16 ENCOUNTER — Telehealth: Payer: Self-pay

## 2021-05-16 ENCOUNTER — Ambulatory Visit: Payer: PPO

## 2021-05-16 NOTE — Telephone Encounter (Signed)
Unable to reach patient for scheduled AWV. The preferred number on file for appointment is non working. Unable to leave a message. Okay to reschedule.

## 2021-06-02 ENCOUNTER — Telehealth: Payer: Self-pay | Admitting: Family

## 2021-06-02 NOTE — Telephone Encounter (Signed)
Patient did a home COVID test and it is positive. She has 101 fever, headache, stuffy nose, sick to stomach, body aches, and little cough. No appointments available. Patient wanted to know what she should do. Patient was transferred to Access Nurse.

## 2021-07-18 ENCOUNTER — Ambulatory Visit: Payer: PPO | Admitting: Family

## 2021-09-04 ENCOUNTER — Ambulatory Visit: Payer: PPO | Admitting: Endocrinology

## 2021-09-06 ENCOUNTER — Ambulatory Visit (INDEPENDENT_AMBULATORY_CARE_PROVIDER_SITE_OTHER): Payer: PPO | Admitting: Family

## 2021-09-06 ENCOUNTER — Other Ambulatory Visit: Payer: Self-pay

## 2021-09-06 ENCOUNTER — Ambulatory Visit: Payer: PPO | Admitting: Internal Medicine

## 2021-09-06 ENCOUNTER — Encounter: Payer: Self-pay | Admitting: Internal Medicine

## 2021-09-06 VITALS — BP 150/78 | HR 78 | Ht 64.0 in | Wt 155.0 lb

## 2021-09-06 VITALS — BP 138/70 | HR 73 | Temp 97.9°F | Ht 63.0 in | Wt 143.4 lb

## 2021-09-06 DIAGNOSIS — E039 Hypothyroidism, unspecified: Secondary | ICD-10-CM | POA: Diagnosis not present

## 2021-09-06 DIAGNOSIS — I4891 Unspecified atrial fibrillation: Secondary | ICD-10-CM | POA: Diagnosis not present

## 2021-09-06 DIAGNOSIS — I499 Cardiac arrhythmia, unspecified: Secondary | ICD-10-CM | POA: Diagnosis not present

## 2021-09-06 DIAGNOSIS — I491 Atrial premature depolarization: Secondary | ICD-10-CM | POA: Diagnosis not present

## 2021-09-06 DIAGNOSIS — S72001D Fracture of unspecified part of neck of right femur, subsequent encounter for closed fracture with routine healing: Secondary | ICD-10-CM

## 2021-09-06 DIAGNOSIS — R030 Elevated blood-pressure reading, without diagnosis of hypertension: Secondary | ICD-10-CM

## 2021-09-06 DIAGNOSIS — M25562 Pain in left knee: Secondary | ICD-10-CM | POA: Diagnosis not present

## 2021-09-06 MED ORDER — MELOXICAM 7.5 MG PO TABS: 7.5000 mg | ORAL_TABLET | Freq: Every day | ORAL | 1 refills | Status: DC | PRN

## 2021-09-06 NOTE — Patient Instructions (Signed)
Medication Instructions:   Your physician recommends that you continue on your current medications as directed. Please refer to the Current Medication list given to you today.  *If you need a refill on your cardiac medications before your next appointment, please call your pharmacy*   Lab Work:  Today: CBC, CMET, Magnesium, TSH  If you have labs (blood work) drawn today and your tests are completely normal, you will receive your results only by: [place] (if you have MyChart) OR A paper copy in the mail If you have any lab test that is abnormal or we need to change your treatment, we will call you to review the results.   Testing/Procedures:  None ordered   Follow-Up: At [facility], you and your health needs are our priority.  As part of our continuing mission to provide you with exceptional heart care, we have created designated Provider Care Teams.  These Care Teams include your primary Cardiologist (physician) and Advanced Practice Providers (APPs -  Physician Assistants and Nurse Practitioners) who all work together to provide you with the care you need, when you need it.  We recommend signing up for the patient portal called "MyChart".  Sign up information is provided on this After Visit Summary.  MyChart is used to connect with patients for Virtual Visits (Telemedicine).  Patients are able to view lab/test results, encounter notes, upcoming appointments, etc.  Non-urgent messages can be sent to your provider as well.   To learn more about what you can do with MyChart, go to [email or web address].    Your next appointment:    Follow up as needed

## 2021-09-06 NOTE — Progress Notes (Signed)
New Outpatient Visit Date: 09/06/2021  Referring Provider: Burnard Hawthorne, FNP [address] [place],  [place] [address]  Chief Complaint: Irregular heartbeat noted by PCP  HPI:  [name] is a 88 y.o. female who is being seen today for the evaluation of irregular heartbeat with concern for atrial fibrillation at the request of [name]. She has a history of DVT, arthritis, and cataracts.  She was seen by [name] earlier today and was noted to have an irregular heartbeat.  [name] reports that she saw [name] today for routine follow-up as well as further evaluation of chronic arthritis involving the left knee.  She denies any palpitations as well as chest pain, shortness of breath, lightheadedness, and edema.  She does not have a history of heart problems nor has she undergone cardiac testing in the past.  --------------------------------------------------------------------------------------------------  Cardiovascular History & Procedures: Cardiovascular Problems: Irregular heart beat  Risk Factors: DVT and family history  Cath/PCI: None  CV Surgery: None  EP Procedures and Devices: None  Non-Invasive Evaluation(s): None  Recent CV Pertinent Labs: Lab Results  Component Value Date   CHOL 190 10/19/2020   HDL 81.50 10/19/2020   LDLCALC 97 10/19/2020   TRIG 54.0 10/19/2020   CHOLHDL 2 10/19/2020   INR 0.99 11/08/2017   INR 1.0 11/10/2014   K 4.3 10/19/2020   K 4.0 11/12/2014   MG 1.8 11/11/2014   BUN 20 10/19/2020   BUN 15 11/12/2014   CREATININE 0.70 10/19/2020   CREATININE 0.65 11/12/2014    --------------------------------------------------------------------------------------------------  Past Medical History:  Diagnosis Date   Arthritis    both knees   Cataract    Surgery scheduled for 03/2014   DVT (deep venous thrombosis) (HCC)    Left popliteal vein    Past Surgical History:  Procedure Laterality Date   CHOLECYSTECTOMY      FEMUR SURGERY  11/10/2014   HIP ARTHROPLASTY Right 11/09/2017   Procedure: ARTHROPLASTY BIPOLAR HIP (HEMIARTHROPLASTY);  Surgeon: Earnestine Leys, MD;  Location: [facility] ORS;  Service: Orthopedics;  Laterality: Right;   IR FLUORO GUIDED NEEDLE PLC ASPIRATION/INJECTION LOC  01/25/2017    Current Meds  Medication Sig   Ascorbic Acid (VITAMIN C PO) Take 1 tablet by mouth daily.   aspirin EC 81 MG tablet Take 81 mg by mouth daily. Swallow whole.   CALCIUM PO Take 1 tablet by mouth daily.   hydrocortisone 2.5 % lotion Apply topically 2 (two) times daily. Insect bite to face and eyelid itching as needed   ibandronate (BONIVA) 150 MG tablet Take 1 tablet (150 mg total) by mouth every 30 (thirty) days. Take in the morning with a full glass of water, on an empty stomach, and do not take anything else by mouth or lie down for the next 30 min.   levothyroxine (SYNTHROID) 50 MCG tablet Take 1 tablet (50 mcg total) by mouth daily.   meloxicam (MOBIC) 7.5 MG tablet Take 1 tablet (7.5 mg total) by mouth daily as needed for pain.   Multiple Vitamin (MULTIVITAMIN PO) Take 1 tablet by mouth daily.    Allergies: Pollen extract  Social History   Tobacco Use   Smoking status: Never   Smokeless tobacco: Never  Vaping Use   Vaping Use: Never used  Substance Use Topics   Alcohol use: No    Alcohol/week: 0.0 standard drinks   Drug use: No    Family History  Problem Relation Age of Onset   Cancer Mother  Heart disease Father 60       MI   Osteoporosis Neg Hx     Review of Systems: A 12-system review of systems was performed and was negative except as noted in the HPI.  --------------------------------------------------------------------------------------------------  Physical Exam: BP (!) 150/78 (BP Location: Right Arm, Patient Position: Sitting, Cuff Size: Large)    Pulse 78    Ht 5\' 4"  (1.626 m)    Wt 155 lb (70.3 kg)    SpO2 97%    BMI 26.61 kg/m  Repeat blood pressure: 150/64 on the left and  156/67 on the right  General: NAD. HEENT: No conjunctival pallor or scleral icterus. Facemask in place. Neck: Supple without lymphadenopathy, thyromegaly, JVD, or HJR. No carotid bruit. Lungs: Normal work of breathing. Clear to auscultation bilaterally without wheezes or crackles. Heart: Regular rate and rhythm with occasional extrasystoles.  No murmurs, rubs, or gallops. Non-displaced PMI. Abd: Bowel sounds present. Soft, NT/ND without hepatosplenomegaly Ext: Trace left calf edema. Radial, PT, and DP pulses are 2+ bilaterally Skin: Warm and dry without rash. Neuro: CNIII-XII intact. Strength and fine-touch sensation intact in upper and lower extremities bilaterally. Psych: Normal mood and affect.  EKG: Normal sinus rhythm with low voltage and left axis deviation.  Previously noted PACs are no longer present.  Lab Results  Component Value Date   WBC 6.7 10/19/2020   HGB 12.5 10/19/2020   HCT 37.2 10/19/2020   MCV 89.8 10/19/2020   PLT 221.0 10/19/2020    Lab Results  Component Value Date   NA 143 10/19/2020   K 4.3 10/19/2020   CL 105 10/19/2020   CO2 33 (H) 10/19/2020   BUN 20 10/19/2020   CREATININE 0.70 10/19/2020   GLUCOSE 84 10/19/2020   ALT 17 10/19/2020    Lab Results  Component Value Date   CHOL 190 10/19/2020   HDL 81.50 10/19/2020   LDLCALC 97 10/19/2020   TRIG 54.0 10/19/2020   CHOLHDL 2 10/19/2020    --------------------------------------------------------------------------------------------------  ASSESSMENT AND PLAN: PACs: [name] has been asymptomatic.  Irregular heart rate was noted by [name] today with the EKG concerning for possible atrial fibrillation.  I have personally reviewed the EKG from [name]'s visit earlier today as well as the tracing performed in our office today.  I do not see any evidence of atrial fibrillation.  Irregularity seen on this morning's tracing is most consistent with sinus rhythm with PACs.  As labs could not be  drawn at [name]'s office prior to [name] leaving, we will check a CBC, CMP, magnesium level, and TSH today.  I do not recommend any further cardiac work-up unless new symptoms develop.  No indication for anticoagulation at this time.  Ongoing primary prevention of ASCVD per [name].  Elevated blood pressure: Ms. Stratmann does not have a history of hypertension.  Blood pressure readings are elevated in the office today, though prior readings with other providers have been normal.  We will defer medication changes today.  Ongoing follow-up of blood pressure per [name].  Follow-up: Return to clinic as needed.  Nelva Bush, MD 09/06/2021 4:03 PM

## 2021-09-06 NOTE — Assessment & Plan Note (Addendum)
Irregular heartbeat from exam. Fortunately patient is asymptomatic.  Her rate appears to be well controlled.  EKG shows atrial fibrillation today. Abnormal EKG with q wave lead III which I was unable to find on prior EKGs 11/07/2017.   Labs ordered however patient left clinic prior to these being obtained. Call out to patient to return for these.   We were able to get an appointment today with cardiology, Dr End. She is currently on asa 81mg  for chronic left DVT as advised ongoing by vascular 02/24/21, Dr Lucky Cowboy. Will await cardiology evaluation.

## 2021-09-06 NOTE — Assessment & Plan Note (Signed)
Unable to reproduce knee pain on exam.  Suspect arthritic changes. Discussed conservative management including Ace wrap, aggressive icing regimen, continued use of Voltaren gel, and meloxicam as needed sparingly.  She politely declines x-ray at this time however we will consider this if pain does not completely resolve

## 2021-09-06 NOTE — Progress Notes (Signed)
Subjective:    Patient ID: Elizabeth Fernandez, female    DOB: 1933-11-24, 88 y.o.   MRN: [id number]  CC: Elizabeth Fernandez is a 88 y.o. female who presents today for follow up.   HPI: Complains of posterior knee pain which has 'flared up' x 3 days She was standing at sink and turned and felt pain without audible sound. Knee is stiff. Knee is not giving way. Using cane for stability.  No pain today. Pain with walking. No falls.  She would like refill on meloxicam 7.5 mg as needed as she out of medication. Advil hasnt been as helpful.  Tried voltaren without relief.     Hypothyroidism-compliant with Synthroid 50 mcg. No history of thyroid nodules.  No trouble swallowing.  She has seen endocrine in the past   History of chronic left DVT-she remains compliant with aspirin 1 mg.  No falls, no bleeding  Due mammogram   HISTORY:  Past Medical History:  Diagnosis Date   Arthritis    both knees   Cataract    Surgery scheduled for 03/2014   DVT (deep venous thrombosis) (HCC)    Left popliteal vein   Past Surgical History:  Procedure Laterality Date   CHOLECYSTECTOMY     FEMUR SURGERY  11/10/2014   HIP ARTHROPLASTY Right 11/09/2017   Procedure: ARTHROPLASTY BIPOLAR HIP (HEMIARTHROPLASTY);  Surgeon: Earnestine Leys, MD;  Location: [facility] ORS;  Service: Orthopedics;  Laterality: Right;   IR FLUORO GUIDED NEEDLE PLC ASPIRATION/INJECTION LOC  01/25/2017   Family History  Problem Relation Age of Onset   Cancer Mother    Heart disease Father 71       MI   Osteoporosis Neg Hx     Allergies: Pollen extract Current Outpatient Medications on File Prior to Visit  Medication Sig Dispense Refill   Ascorbic Acid (VITAMIN C PO) Take by mouth.     aspirin EC 81 MG tablet Take 81 mg by mouth daily. Swallow whole.     CALCIUM PO Take by mouth.     hydrocortisone 2.5 % lotion Apply topically 2 (two) times daily. Insect bite to face and eyelid itching as needed 59 mL 0   ibandronate (BONIVA) 150 MG tablet  Take 1 tablet (150 mg total) by mouth every 30 (thirty) days. Take in the morning with a full glass of water, on an empty stomach, and do not take anything else by mouth or lie down for the next 30 min. 3 tablet 3   levothyroxine (SYNTHROID) 50 MCG tablet Take 1 tablet (50 mcg total) by mouth daily. 90 tablet 3   Multiple Vitamin (MULTIVITAMIN PO) Take by mouth.     No current facility-administered medications on file prior to visit.    Social History   Tobacco Use   Smoking status: Never   Smokeless tobacco: Never  Vaping Use   Vaping Use: Never used  Substance Use Topics   Alcohol use: No    Alcohol/week: 0.0 standard drinks   Drug use: No    Review of Systems  Constitutional:  Negative for chills and fever.  HENT:  Negative for trouble swallowing.   Respiratory:  Negative for cough.   Cardiovascular:  Negative for chest pain and palpitations.  Gastrointestinal:  Negative for nausea and vomiting.  Musculoskeletal:  Positive for arthralgias.     Objective:    BP 138/70 (BP Location: Left Arm, Patient Position: Sitting, Cuff Size: Large)    Pulse 73    Temp 97.9  F (36.6 C) (Oral)    Ht 5\' 3"  (1.6 m)    Wt 143 lb 6.4 oz (65 kg)    SpO2 97%    BMI 25.40 kg/m  BP Readings from Last 3 Encounters:  09/06/21 138/70  03/15/21 112/64  03/03/21 138/73   Wt Readings from Last 3 Encounters:  09/06/21 143 lb 6.4 oz (65 kg)  03/15/21 149 lb 12.8 oz (67.9 kg)  03/03/21 147 lb (66.7 kg)    Physical Exam Vitals reviewed.  Constitutional:      Appearance: She is well-developed.  Eyes:     Conjunctiva/sclera: Conjunctivae normal.  Cardiovascular:     Rate and Rhythm: Normal rate. Rhythm regularly irregular.     Pulses: Normal pulses.     Heart sounds: Normal heart sounds.  Pulmonary:     Effort: Pulmonary effort is normal.     Breath sounds: Normal breath sounds. No wheezing, rhonchi or rales.  Musculoskeletal:     Left knee: No swelling or bony tenderness. Normal range of  motion. No tenderness.     Comments: Bilateral knees are symmetric. No effusion appreciated. No increase in warmth or erythema. Crepitus felt with flexion of bilateral knees.  Left knee:  Able to extend to -5 to 10 degrees and flex to 110 degrees. No catching with McMurray maneuver.   No calf tenderness of lower leg edema bilaterally.    Skin:    General: Skin is warm and dry.  Neurological:     Mental Status: She is alert.  Psychiatric:        Speech: Speech normal.        Behavior: Behavior normal.        Thought Content: Thought content normal.       Assessment & Plan:   Problem List Items Addressed This Visit       Endocrine   Hypothyroidism    Previously TSH elevated. Pending TSH. Continue Synthroid 50 mcg for now. No recent follow up with Dr Loanne Drilling, endocrine.       Relevant Orders   TSH     Other   Irregular heartbeat - Primary    Irregular heartbeat from exam. Fortunately patient is asymptomatic.  Her rate appears to be well controlled.  EKG shows atrial fibrillation today. Abnormal EKG with q wave lead III which I was unable to find on prior EKGs 11/07/2017.   Labs ordered however patient left clinic prior to these being obtained. Call out to patient to return for these.   We were able to get an appointment today with cardiology, Dr End. She is currently on asa 81mg  for chronic left DVT as advised ongoing by vascular 02/24/21, Dr Lucky Cowboy. Will await cardiology evaluation.       Relevant Orders   EKG 12-Lead (Completed)   Ambulatory referral to Cardiology   CBC with Differential/Platelet   Comprehensive metabolic panel   Magnesium   Left knee pain    Unable to reproduce knee pain on exam.  Suspect arthritic changes. Discussed conservative management including Ace wrap, aggressive icing regimen, continued use of Voltaren gel, and meloxicam as needed sparingly.  She politely declines x-ray at this time however we will consider this if pain does not completely resolve       Other Visit Diagnoses     Closed fracture of right hip with routine healing, subsequent encounter       Relevant Medications   meloxicam (MOBIC) 7.5 MG tablet  I have discontinued Carrolyn C. Carithers's ibuprofen. I am also having her maintain her Multiple Vitamin (MULTIVITAMIN PO), CALCIUM PO, Ascorbic Acid (VITAMIN C PO), hydrocortisone, levothyroxine, ibandronate, aspirin EC, and meloxicam.   Meds ordered this encounter  Medications   meloxicam (MOBIC) 7.5 MG tablet    Sig: Take 1 tablet (7.5 mg total) by mouth daily as needed for pain.    Dispense:  30 tablet    Refill:  1    Order Specific Question:   Supervising Provider    Answer:   Crecencio Mc [[id number]]    Return precautions given.   Risks, benefits, and alternatives of the medications and treatment plan prescribed today were discussed, and patient expressed understanding.   Education regarding symptom management and diagnosis given to patient on AVS.  Continue to follow with Burnard Hawthorne, FNP for routine health maintenance.   Cassandria Santee and I agreed with plan.   Mable Paris, FNP

## 2021-09-06 NOTE — Assessment & Plan Note (Addendum)
Previously TSH elevated. Pending TSH. Continue Synthroid 50 mcg for now. No recent follow up with Dr Loanne Drilling, endocrine.

## 2021-09-07 LAB — CBC
Hematocrit: 35.9 % (ref 34.0–46.6)
Hemoglobin: 12 g/dL (ref 11.1–15.9)
MCH: 29.7 pg (ref 26.6–33.0)
MCHC: 33.4 g/dL (ref 31.5–35.7)
MCV: 89 fL (ref 79–97)
Platelets: 212 10*3/uL (ref 150–450)
RBC: 4.04 x10E6/uL (ref 3.77–5.28)
RDW: 12.5 % (ref 11.7–15.4)
WBC: 5.9 10*3/uL (ref 3.4–10.8)

## 2021-09-07 LAB — COMPREHENSIVE METABOLIC PANEL
ALT: 10 IU/L (ref 0–32)
AST: 14 IU/L (ref 0–40)
Albumin/Globulin Ratio: 1.5 (ref 1.2–2.2)
Albumin: 4 g/dL (ref 3.6–4.6)
Alkaline Phosphatase: 105 IU/L (ref 44–121)
BUN/Creatinine Ratio: 40 — ABNORMAL HIGH (ref 12–28)
BUN: 25 mg/dL (ref 8–27)
Bilirubin Total: 0.2 mg/dL (ref 0.0–1.2)
CO2: 26 mmol/L (ref 20–29)
Calcium: 9.4 mg/dL (ref 8.7–10.3)
Chloride: 102 mmol/L (ref 96–106)
Creatinine, Ser: 0.62 mg/dL (ref 0.57–1.00)
Globulin, Total: 2.7 g/dL (ref 1.5–4.5)
Glucose: 112 mg/dL — ABNORMAL HIGH (ref 70–99)
Potassium: 4.2 mmol/L (ref 3.5–5.2)
Sodium: 142 mmol/L (ref 134–144)
Total Protein: 6.7 g/dL (ref 6.0–8.5)
eGFR: 87 mL/min/{1.73_m2} (ref >59)

## 2021-09-07 LAB — TSH: TSH: 6.02 u[IU]/mL — ABNORMAL HIGH (ref 0.450–4.500)

## 2021-09-07 LAB — MAGNESIUM: Magnesium: 2.1 mg/dL (ref 1.6–2.3)

## 2021-09-11 ENCOUNTER — Other Ambulatory Visit: Payer: Self-pay | Admitting: Family

## 2021-09-11 ENCOUNTER — Telehealth: Payer: Self-pay | Admitting: *Deleted

## 2021-09-11 NOTE — Telephone Encounter (Signed)
The patient has been notified of the result and verbalized understanding.  All questions (if any) were answered.     

## 2021-09-11 NOTE — Telephone Encounter (Signed)
-----   Message from Nelva Bush, MD sent at 09/09/2021  2:52 PM EST ----- Please let Ms. Crittendon know that her kidney function, liver function, and electrolytes are normal.  Her blood counts are also normal.  Her TSH is still mildly elevated, though better compared with 5 months ago.  I recommend that she follow-up with her PCP and endocrinologist for ongoing management of her hypothyroidism.  I will forward the results to [name] for her records.  No further cardiac intervention is recommended at this time.

## 2021-09-12 ENCOUNTER — Other Ambulatory Visit: Payer: Self-pay

## 2021-09-12 DIAGNOSIS — E039 Hypothyroidism, unspecified: Secondary | ICD-10-CM

## 2021-09-12 MED ORDER — LEVOTHYROXINE SODIUM 75 MCG PO TABS: 75.0000 ug | ORAL_TABLET | Freq: Every day | ORAL | 1 refills | Status: DC

## 2021-09-13 ENCOUNTER — Other Ambulatory Visit: Payer: Self-pay | Admitting: Family

## 2021-09-13 DIAGNOSIS — E039 Hypothyroidism, unspecified: Secondary | ICD-10-CM

## 2021-09-14 NOTE — Progress Notes (Signed)
Patient scheduled.

## 2021-10-23 ENCOUNTER — Other Ambulatory Visit (INDEPENDENT_AMBULATORY_CARE_PROVIDER_SITE_OTHER): Payer: PPO

## 2021-10-23 ENCOUNTER — Other Ambulatory Visit: Payer: Self-pay

## 2021-10-23 DIAGNOSIS — I499 Cardiac arrhythmia, unspecified: Secondary | ICD-10-CM

## 2021-10-23 DIAGNOSIS — E039 Hypothyroidism, unspecified: Secondary | ICD-10-CM

## 2021-10-23 LAB — CBC WITH DIFFERENTIAL/PLATELET
Basophils Absolute: 0.1 10*3/uL (ref 0.0–0.1)
Basophils Relative: 1 % (ref 0.0–3.0)
Eosinophils Absolute: 0.2 10*3/uL (ref 0.0–0.7)
Eosinophils Relative: 4 % (ref 0.0–5.0)
HCT: 36.4 % (ref 36.0–46.0)
Hemoglobin: 12.4 g/dL (ref 12.0–15.0)
Lymphocytes Relative: 21.8 % (ref 12.0–46.0)
Lymphs Abs: 1.2 10*3/uL (ref 0.7–4.0)
MCHC: 34.1 g/dL (ref 30.0–36.0)
MCV: 90.2 fl (ref 78.0–100.0)
Monocytes Absolute: 0.3 10*3/uL (ref 0.1–1.0)
Monocytes Relative: 6.1 % (ref 3.0–12.0)
Neutro Abs: 3.6 10*3/uL (ref 1.4–7.7)
Neutrophils Relative %: 67.1 % (ref 43.0–77.0)
Platelets: 213 10*3/uL (ref 150.0–400.0)
RBC: 4.04 Mil/uL (ref 3.87–5.11)
RDW: 13.1 % (ref 11.5–15.5)
WBC: 5.4 10*3/uL (ref 4.0–10.5)

## 2021-10-23 LAB — COMPREHENSIVE METABOLIC PANEL
ALT: 11 U/L (ref 0–35)
AST: 15 U/L (ref 0–37)
Albumin: 4.1 g/dL (ref 3.5–5.2)
Alkaline Phosphatase: 89 U/L (ref 39–117)
BUN: 18 mg/dL (ref 6–23)
CO2: 33 mEq/L — ABNORMAL HIGH (ref 19–32)
Calcium: 9.5 mg/dL (ref 8.4–10.5)
Chloride: 103 mEq/L (ref 96–112)
Creatinine, Ser: 0.77 mg/dL (ref 0.40–1.20)
GFR: 69.99 mL/min (ref >60.00)
Glucose, Bld: 80 mg/dL (ref 70–99)
Potassium: 4.1 mEq/L (ref 3.5–5.1)
Sodium: 143 mEq/L (ref 135–145)
Total Bilirubin: 0.6 mg/dL (ref 0.2–1.2)
Total Protein: 6.6 g/dL (ref 6.0–8.3)

## 2021-10-23 LAB — TSH: TSH: 6.05 u[IU]/mL — ABNORMAL HIGH (ref 0.35–5.50)

## 2021-10-23 LAB — MAGNESIUM: Magnesium: 1.9 mg/dL (ref 1.5–2.5)

## 2021-10-25 ENCOUNTER — Telehealth: Payer: Self-pay

## 2021-10-25 NOTE — Telephone Encounter (Signed)
Patient had biopsy done 04/2021 and was never contacted or scheduled for bx proven BCC. Spoke with patient today, advised her bx results showed [place] and have her scheduled for excision on 12/12/2021. ?Lurlean Horns., RMA ?

## 2021-11-05 ENCOUNTER — Other Ambulatory Visit: Payer: Self-pay | Admitting: Family

## 2021-11-08 ENCOUNTER — Other Ambulatory Visit: Payer: Self-pay | Admitting: Family

## 2021-11-08 ENCOUNTER — Telehealth: Payer: Self-pay

## 2021-11-08 ENCOUNTER — Other Ambulatory Visit: Payer: Self-pay

## 2021-11-08 DIAGNOSIS — E039 Hypothyroidism, unspecified: Secondary | ICD-10-CM

## 2021-11-08 MED ORDER — LEVOTHYROXINE SODIUM 88 MCG PO TABS: 88.0000 ug | ORAL_TABLET | Freq: Every day | ORAL | 2 refills | Status: DC

## 2021-11-08 NOTE — Telephone Encounter (Signed)
LMTCB office to get results ?

## 2021-11-08 NOTE — Telephone Encounter (Signed)
Lmtcb office to get results ?

## 2021-11-17 ENCOUNTER — Other Ambulatory Visit: Payer: Self-pay

## 2021-11-30 ENCOUNTER — Ambulatory Visit (INDEPENDENT_AMBULATORY_CARE_PROVIDER_SITE_OTHER): Payer: PPO

## 2021-11-30 VITALS — Ht 64.0 in | Wt 155.0 lb

## 2021-11-30 DIAGNOSIS — Z0000 Encounter for general adult medical examination without abnormal findings: Secondary | ICD-10-CM

## 2021-11-30 NOTE — Progress Notes (Signed)
Subjective:   Elizabeth Fernandez is a 88 y.o. female who presents for Medicare Annual (Subsequent) preventive examination.  Review of Systems    No ROS.  Medicare Wellness Virtual Visit.  Visual/audio telehealth visit, UTA vital signs.   See social history for additional risk factors.   Cardiac Risk Factors include: advanced age (>67men, >51 women)     Objective:    Today's Vitals   11/30/21 1103  Weight: 155 lb (70.3 kg)  Height: 5\' 4"  (1.626 m)   Body mass index is 26.61 kg/m.     11/30/2021   11:08 AM 05/13/2020   11:20 AM 05/13/2019   12:18 PM 05/09/2018   12:28 PM 11/27/2017    3:18 PM 11/08/2017    1:39 AM 02/19/2017    4:00 AM  Advanced Directives  Does Patient Have a Medical Advance Directive? Yes Yes Yes Yes No Yes No  Type of Estate agent of [place];Living will Healthcare Power of [place];Living will Healthcare Power of [place];Living will Healthcare Power of [place];Living will  Living will   Does patient want to make changes to medical advance directive? No - Patient declined No - Patient declined No - Patient declined No - Patient declined  No - Patient declined   Copy of Healthcare Power of Attorney in Chart? No - copy requested No - copy requested No - copy requested No - copy requested     Would patient like information on creating a medical advance directive?      No - Patient declined No - Patient declined    Current Medications (verified) Outpatient Encounter Medications as of 11/30/2021  Medication Sig   Ascorbic Acid (VITAMIN C PO) Take 1 tablet by mouth daily.   aspirin EC 81 MG tablet Take 81 mg by mouth daily. Swallow whole.   CALCIUM PO Take 1 tablet by mouth daily.   hydrocortisone 2.5 % lotion Apply topically 2 (two) times daily. Insect bite to face and eyelid itching as needed   ibandronate (BONIVA) 150 MG tablet Take 1 tablet (150 mg total) by mouth every 30 (thirty) days. Take in the morning with a full glass of water, on an  empty stomach, and do not take anything else by mouth or lie down for the next 30 min.   levothyroxine (SYNTHROID) 88 MCG tablet Take 1 tablet (88 mcg total) by mouth daily.   meloxicam (MOBIC) 7.5 MG tablet Take 1 tablet (7.5 mg total) by mouth daily as needed for pain.   Multiple Vitamin (MULTIVITAMIN PO) Take 1 tablet by mouth daily.   No facility-administered encounter medications on file as of 11/30/2021.    Allergies (verified) Pollen extract   History: Past Medical History:  Diagnosis Date   Arthritis    both knees   BCC (basal cell carcinoma) 04/28/2021   left distal lateral deltoid   Cataract    Surgery scheduled for 03/2014   DVT (deep venous thrombosis) (HCC)    Left popliteal vein   Past Surgical History:  Procedure Laterality Date   CHOLECYSTECTOMY     FEMUR SURGERY  11/10/2014   HIP ARTHROPLASTY Right 11/09/2017   Procedure: ARTHROPLASTY BIPOLAR HIP (HEMIARTHROPLASTY);  Surgeon: Deeann Saint, MD;  Location: [facility] ORS;  Service: Orthopedics;  Laterality: Right;   IR FLUORO GUIDED NEEDLE PLC ASPIRATION/INJECTION LOC  01/25/2017   Family History  Problem Relation Age of Onset   Cancer Mother    Heart attack Father 62   Osteoporosis Neg Hx  Social History   Socioeconomic History   Marital status: Widowed    Spouse name: Not on file   Number of children: Not on file   Years of education: Not on file   Highest education level: Not on file  Occupational History   Not on file  Tobacco Use   Smoking status: Never   Smokeless tobacco: Never  Vaping Use   Vaping Use: Never used  Substance and Sexual Activity   Alcohol use: No    Alcohol/week: 0.0 standard drinks   Drug use: No   Sexual activity: Not Currently  Other Topics Concern   Not on file  Social History Narrative   May go back to work after recovery 2016; [employer].    Lives with son    Children- 2 ; one of her sons has drug addiction       Grandchildren- none    Pets: 1 dog inside     Enjoys- Reading and mowing yard    Social Determinants of Corporate investment banker Strain: Low Risk    Difficulty of Paying Living Expenses: Not hard at all  Food Insecurity: No Food Insecurity   Worried About Programme researcher, broadcasting/film/video in the Last Year: Never true   Barista in the Last Year: Never true  Transportation Needs: No Transportation Needs   Lack of Transportation (Medical): No   Lack of Transportation (Non-Medical): No  Physical Activity: Not on file  Stress: No Stress Concern Present   Feeling of Stress : Not at all  Social Connections: Unknown   Frequency of Communication with Friends and Family: More than three times a week   Frequency of Social Gatherings with Friends and Family: Not on file   Attends Religious Services: More than 4 times per year   Active Member of [employer] or Organizations: Yes   Attends Engineer, structural: More than 4 times per year   Marital Status: Not on file   Tobacco Counseling Counseling given: Not Answered  Clinical Intake: Pre-visit preparation completed: Yes        Diabetes: No  How often do you need to have someone help you when you read instructions, pamphlets, or other written materials from your doctor or pharmacy?: 1 - Never  Interpreter Needed?: No    Activities of Daily Living    11/30/2021   11:13 AM  In your present state of health, do you have any difficulty performing the following activities:  Hearing? 0  Vision? 0  Difficulty concentrating or making decisions? 0  Walking or climbing stairs? 0  Dressing or bathing? 0  Doing errands, shopping? 0  Preparing Food and eating ? N  Using the Toilet? N  In the past six months, have you accidently leaked urine? N  Do you have problems with loss of bowel control? N  Managing your Medications? N  Managing your Finances? Y  Housekeeping or managing your Housekeeping? N   Patient Care Team: Allegra Grana, FNP as PCP - General (Family  Medicine)  Indicate any recent Medical Services you may have received from other than [facility] providers in the past year (date may be approximate).     Assessment:   This is a routine wellness examination for Elizabeth Fernandez.  Virtual Visit via Telephone Note  I connected with  [name] on 11/30/21 at 11:00 AM EDT by telephone and verified that I am speaking with the correct person using two identifiers.  Persons participating  in the virtual visit: patient/Nurse Health Advisor   I discussed the limitations of performing an evaluation and management service by telehealth. The patient expressed understanding and agreed to proceed. We continued and completed visit with audio only. Some vital signs may be absent or patient reported.   Hearing/Vision screen Hearing Screening - Comments:: Patient is able to hear conversational tones without difficulty. No issues reported. Vision Screening - Comments:: Wears corrective lenses when reading Cataract extraction, bilateral    Dietary issues and exercise activities discussed: Current Exercise Habits: Home exercise routine, Intensity: Mild Healthy diet Good water intake   Goals Addressed             This Visit's Progress    Maintain Healthy Lifestyle       Healthy diet. Stay active. Stay hydrated.       Depression Screen    11/30/2021   11:07 AM 09/06/2021   10:42 AM 05/13/2020   11:13 AM 05/13/2019   12:19 PM 05/09/2018   12:30 PM 02/25/2017   10:20 AM 02/18/2017    3:30 PM  PHQ 2/9 Scores  PHQ - 2 Score 0 0 0 0 0 0 0    Fall Risk    11/30/2021   11:09 AM 10/19/2020    1:09 PM 05/13/2020   11:21 AM 03/01/2020    1:26 PM 05/13/2019   12:19 PM  Fall Risk   Falls in the past year? 1 0 0 0 0  Comment    Emmi Telephone Survey: data to providers prior to load   Number falls in past yr: 0 0 0    Injury with Fall? 0 0     Comment Fell due to her dog moving her off balance      Risk for fall due to :  Impaired balance/gait;Impaired mobility      Follow up Falls evaluation completed Falls evaluation completed Falls evaluation completed     FALL RISK PREVENTION PERTAINING TO THE HOME: Home free of loose throw rugs in walkways, pet beds, electrical cords, etc? Yes  Adequate lighting in your home to reduce risk of falls? Yes   ASSISTIVE DEVICES UTILIZED TO PREVENT FALLS: Use of a cane, walker or w/c? Yes   TIMED UP AND GO: Was the test performed? No .   Cognitive Function: Patient is alert and oriented x3.     10/24/2016    2:22 PM 10/24/2015    1:38 PM  MMSE - Mini Mental State Exam  Orientation to time 5 5  Orientation to Place 5 5  Registration 3 3  Attention/ Calculation 5 5  Recall 3 3  Language- name 2 objects 2 2  Language- repeat 1 1  Language- follow 3 step command 3 3  Language- read & follow direction 1 1  Write a sentence 1 1  Copy design 1 1  Total score 30 30        11/30/2021   11:20 AM 05/13/2020   11:28 AM 05/13/2019   12:21 PM 05/09/2018   12:32 PM  6CIT Screen  What Year? 0 points 0 points 0 points 0 points  What month? 0 points 0 points 0 points 0 points  What time? 0 points 0 points 0 points 0 points  Count back from 20 0 points  0 points 0 points  Months in reverse 0 points 0 points 0 points 0 points  Repeat phrase 0 points  0 points 0 points  Total Score 0 points  0 points  0 points    Immunizations Immunization History  Administered Date(s) Administered   Fluad Quad(high Dose 65+) 04/30/2019   Influenza, High Dose Seasonal PF 06/19/2016, 05/09/2018, 05/23/2021   Influenza,inj,Quad PF,6+ Mos 07/26/2015   Influenza-Unspecified 06/06/2020   Moderna SARS-COV2 Booster Vaccination 07/24/2020   PFIZER(Purple Top)SARS-COV-2 Vaccination 08/21/2019, 09/11/2019   PPD Test 11/18/2014   Pneumococcal Conjugate-13 10/24/2016   Pneumococcal Polysaccharide-23 04/30/2019   TDAP status: Due, Education has been provided regarding the importance of this vaccine. Advised may receive this vaccine at  local pharmacy or Health Dept. Aware to provide a copy of the vaccination record if obtained from local pharmacy or Health Dept. Verbalized acceptance and understanding.Deferred.   Shingrix Completed?: No.    Education has been provided regarding the importance of this vaccine. Patient has been advised to call insurance company to determine out of pocket expense if they have not yet received this vaccine. Advised may also receive vaccine at local pharmacy or Health Dept. Verbalized acceptance and understanding.  Screening Tests Health Maintenance  Topic Date Due   COVID-19 Vaccine (3 - Pfizer risk series) 12/16/2021 (Originally 08/21/2020)   Zoster Vaccines- Shingrix (1 of 2) 03/01/2022 (Originally 09/08/1952)   TETANUS/TDAP  12/01/2022 (Originally 09/08/1952)   INFLUENZA VACCINE  03/06/2022   Pneumonia Vaccine 21+ Years old  Completed   DEXA SCAN  Completed   HPV VACCINES  Aged Out   Health Maintenance There are no preventive care reminders to display for this patient.  Lung Cancer Screening: (Low Dose CT Chest recommended if Age 40-80 years, 30 pack-year currently smoking OR have quit w/in 15years.) does not qualify.   Hepatitis C Screening: does not qualify  Vision Screening: Recommended annual ophthalmology exams for early detection of glaucoma and other disorders of the eye.  Dental Screening: Recommended annual dental exams for proper oral hygiene  Community Resource Referral / Chronic Care Management: CRR required this visit?  No   CCM required this visit?  No      Plan:   Keep all routine maintenance appointments.   I have personally reviewed and noted the following in the patient's chart:   Medical and social history Use of alcohol, tobacco or illicit drugs  Current medications and supplements including opioid prescriptions.  Functional ability and status Nutritional status Physical activity Advanced directives List of other physicians Hospitalizations, surgeries,  and ER visits in previous 12 months Vitals Screenings to include cognitive, depression, and falls Referrals and appointments  In addition, I have reviewed and discussed with patient certain preventive protocols, quality metrics, and best practice recommendations. A written personalized care plan for preventive services as well as general preventive health recommendations were provided to patient.     Ashok Pall, LPN   1/61/0960

## 2021-11-30 NOTE — Patient Instructions (Addendum)
?  Elizabeth Fernandez , ?Thank you for taking time to come for your Medicare Wellness Visit. I appreciate your ongoing commitment to your health goals. Please review the following plan we discussed and let me know if I can assist you in the future.  ? ?These are the goals we discussed: ? Goals   ? ?  Maintain Healthy Lifestyle   ?  Healthy diet. ?Stay active. ?Stay hydrated. ?  ? ?  ?  ?This is a list of the screening recommended for you and due dates:  ?Health Maintenance  ?Topic Date Due  ? COVID-19 Vaccine (3 - Pfizer risk series) 12/16/2021*  ? Zoster (Shingles) Vaccine (1 of 2) 03/01/2022*  ? Tetanus Vaccine  12/01/2022*  ? Flu Shot  03/06/2022  ? Pneumonia Vaccine  Completed  ? DEXA scan (bone density measurement)  Completed  ? HPV Vaccine  Aged Out  ?*Topic was postponed. The date shown is not the original due date.  ?  ?

## 2021-12-11 ENCOUNTER — Encounter: Payer: Self-pay | Admitting: Family Medicine

## 2021-12-11 ENCOUNTER — Ambulatory Visit (INDEPENDENT_AMBULATORY_CARE_PROVIDER_SITE_OTHER): Payer: PPO | Admitting: Family Medicine

## 2021-12-11 ENCOUNTER — Ambulatory Visit (INDEPENDENT_AMBULATORY_CARE_PROVIDER_SITE_OTHER): Payer: PPO

## 2021-12-11 VITALS — BP 110/80 | HR 90 | Temp 97.9°F | Ht 64.0 in | Wt 156.2 lb

## 2021-12-11 DIAGNOSIS — R051 Acute cough: Secondary | ICD-10-CM | POA: Diagnosis not present

## 2021-12-11 DIAGNOSIS — R0602 Shortness of breath: Secondary | ICD-10-CM | POA: Diagnosis not present

## 2021-12-11 DIAGNOSIS — R059 Cough, unspecified: Secondary | ICD-10-CM | POA: Diagnosis not present

## 2021-12-11 LAB — POC COVID19 BINAXNOW: SARS Coronavirus 2 Ag: NEGATIVE

## 2021-12-11 MED ORDER — ALBUTEROL SULFATE HFA 108 (90 BASE) MCG/ACT IN AERS: 2.0000 | INHALATION_SPRAY | Freq: Four times a day (QID) | RESPIRATORY_TRACT | 0 refills | Status: DC | PRN

## 2021-12-11 MED ORDER — DOXYCYCLINE HYCLATE 100 MG PO TABS: 100.0000 mg | ORAL_TABLET | Freq: Two times a day (BID) | ORAL | 0 refills | Status: DC

## 2021-12-11 MED ORDER — PREDNISONE 20 MG PO TABS: 40.0000 mg | ORAL_TABLET | Freq: Every day | ORAL | 0 refills | Status: DC

## 2021-12-11 NOTE — Patient Instructions (Addendum)
Nice to see you. ?I am going to start you on a steroid and an antibiotic given that you have previously been shown to have emphysema on imaging. ?We will get an x-ray today and contact you with the results. ?If you develop worsening shortness of breath or any fevers please seek medical attention. ?Please stay home until we contact you with your COVID test result. ?

## 2021-12-11 NOTE — Progress Notes (Signed)
?Tommi Rumps, MD ?Phone: [phone] ? ?LAVONDA THAL is a 88 y.o. female who presents today for same day visit.  ? ?Respiratory illness: Patient notes onset 2 days ago.  Started with sore throat and has a mild hacking cough.  Notes the sore throat is mild.  Notes some congestion.  Her eyes feel swollen.  Some shortness of breath if she is moving around.  No fevers.  No taste or smell disturbances.  Some appetite decrease.  Some wheezing.  No ear fullness.  She has not tried any medications.  No known COVID exposures.  She has not tested herself for COVID-19. ? ?Social History  ? ?Tobacco Use  ?Smoking Status Never  ?Smokeless Tobacco Never  ? ? ?Current Outpatient Medications on File Prior to Visit  ?Medication Sig Dispense Refill  ? Ascorbic Acid (VITAMIN C PO) Take 1 tablet by mouth daily.    ? aspirin EC 81 MG tablet Take 81 mg by mouth daily. Swallow whole.    ? CALCIUM PO Take 1 tablet by mouth daily.    ? hydrocortisone 2.5 % lotion Apply topically 2 (two) times daily. Insect bite to face and eyelid itching as needed 59 mL 0  ? ibandronate (BONIVA) 150 MG tablet Take 1 tablet (150 mg total) by mouth every 30 (thirty) days. Take in the morning with a full glass of water, on an empty stomach, and do not take anything else by mouth or lie down for the next 30 min. 3 tablet 3  ? levothyroxine (SYNTHROID) 88 MCG tablet Take 1 tablet (88 mcg total) by mouth daily. 90 tablet 2  ? meloxicam (MOBIC) 7.5 MG tablet Take 1 tablet (7.5 mg total) by mouth daily as needed for pain. 30 tablet 1  ? Multiple Vitamin (MULTIVITAMIN PO) Take 1 tablet by mouth daily.    ? ?No current facility-administered medications on file prior to visit.  ? ? ? ?ROS see history of present illness ? ?Objective ? ?Physical Exam ?Vitals:  ? 12/11/21 1340  ?BP: 110/80  ?Pulse: 90  ?Temp: 97.9 ?F (36.6 ?C)  ?SpO2: 97%  ? ? ?BP Readings from Last 3 Encounters:  ?12/11/21 110/80  ?09/06/21 (!) 150/78  ?09/06/21 138/70  ? ?Wt Readings from Last 3  Encounters:  ?12/11/21 156 lb 3.2 oz (70.9 kg)  ?11/30/21 155 lb (70.3 kg)  ?09/06/21 155 lb (70.3 kg)  ? ? ?Physical Exam ?Constitutional:   ?   General: She is not in acute distress. ?   Appearance: She is not diaphoretic.  ?HENT:  ?   Left Ear: Tympanic membrane normal.  ?   Ears:  ?   Comments: Right TM obscured by cerumen ?   Mouth/Throat:  ?   Mouth: Mucous membranes are moist.  ?   Pharynx: Oropharynx is clear.  ?Cardiovascular:  ?   Rate and Rhythm: Normal rate and regular rhythm.  ?   Heart sounds: Normal heart sounds.  ?Pulmonary:  ?   Effort: Pulmonary effort is normal.  ?   Breath sounds: Normal breath sounds.  ?Lymphadenopathy:  ?   Cervical: No cervical adenopathy.  ?Skin: ?   General: Skin is warm and dry.  ?Neurological:  ?   Mental Status: She is alert.  ? ? ? ?Assessment/Plan: Please see individual problem list. ? ?Problem List Items Addressed This Visit   ? ? Acute cough - Primary  ?  Acute onset cough and congestion with sore throat.  This could be related to allergies  though could also be related to a viral illness.  Certainly with her history of emphysema there could be some level of emphysema or COPD exacerbation.  She has a benign exam today.  Vitals are stable.  Her rapid COVID test is negative.  We will send out a COVID PCR test.  She was advised to stay home until we get her COVID PCR test result back.  She was advised to seek medical attention for worsening shortness of breath, chest pain, cough productive of blood, or fevers of 103 ?F or higher.  We will proceed with treatment with prednisone, doxycycline, and albuterol given the potential for COPD exacerbation.  Chest x-ray will be completed today. ? ?  ?  ? Relevant Medications  ? predniSONE (DELTASONE) 20 MG tablet  ? doxycycline (VIBRA-TABS) 100 MG tablet  ? albuterol (VENTOLIN HFA) 108 (90 Base) MCG/ACT inhaler  ? Other Relevant Orders  ? POC COVID-19 (Completed)  ? DG Chest 2 View  ? Novel Coronavirus, NAA ([facility])  ? ? ?Return if  symptoms worsen or fail to improve. ? ? ?Tommi Rumps, MD ?[place] ? ?

## 2021-12-11 NOTE — Assessment & Plan Note (Signed)
Acute onset cough and congestion with sore throat.  This could be related to allergies though could also be related to a viral illness.  Certainly with her history of emphysema there could be some level of emphysema or COPD exacerbation.  She has a benign exam today.  Vitals are stable.  Her rapid COVID test is negative.  We will send out a COVID PCR test.  She was advised to stay home until we get her COVID PCR test result back.  She was advised to seek medical attention for worsening shortness of breath, chest pain, cough productive of blood, or fevers of 103 ?F or higher.  We will proceed with treatment with prednisone, doxycycline, and albuterol given the potential for COPD exacerbation.  Chest x-ray will be completed today. ?

## 2021-12-12 ENCOUNTER — Encounter: Payer: PPO | Admitting: Dermatology

## 2021-12-12 LAB — NOVEL CORONAVIRUS, NAA: SARS-CoV-2, NAA: NOT DETECTED

## 2021-12-18 ENCOUNTER — Telehealth: Payer: Self-pay | Admitting: Family

## 2021-12-18 ENCOUNTER — Telehealth: Payer: Self-pay

## 2021-12-18 NOTE — Telephone Encounter (Signed)
LMTCB to talk about whether she is taking her prednisone correctly ?

## 2021-12-18 NOTE — Telephone Encounter (Signed)
Patient has a family member that thinks she is taking her predniSONE (DELTASONE) 20 MG tablet the wrong way. Please call her. ?

## 2021-12-18 NOTE — Telephone Encounter (Signed)
Jenate  ?Call pt asap and see what questions she has.  ? ? ?FYI  ?Dr Caryl Bis ?

## 2021-12-18 NOTE — Telephone Encounter (Signed)
LMTCB to talk about whether she is taking her Prednisone correctly ?

## 2021-12-19 NOTE — Telephone Encounter (Signed)
Spoke to patient about Prednisone and she stated that she was done with medication and she was fine . Patient has an appointment on 12/20/21 ?

## 2021-12-20 ENCOUNTER — Encounter: Payer: Self-pay | Admitting: Family

## 2021-12-20 ENCOUNTER — Ambulatory Visit (INDEPENDENT_AMBULATORY_CARE_PROVIDER_SITE_OTHER): Payer: PPO | Admitting: Family

## 2021-12-20 VITALS — BP 120/60 | HR 85 | Temp 98.3°F | Ht 64.0 in | Wt 156.6 lb

## 2021-12-20 DIAGNOSIS — B370 Candidal stomatitis: Secondary | ICD-10-CM | POA: Diagnosis not present

## 2021-12-20 DIAGNOSIS — M7989 Other specified soft tissue disorders: Secondary | ICD-10-CM

## 2021-12-20 DIAGNOSIS — R051 Acute cough: Secondary | ICD-10-CM

## 2021-12-20 LAB — CBC WITH DIFFERENTIAL/PLATELET
Basophils Absolute: 0.1 10*3/uL (ref 0.0–0.1)
Basophils Relative: 1.1 % (ref 0.0–3.0)
Eosinophils Absolute: 0.3 10*3/uL (ref 0.0–0.7)
Eosinophils Relative: 4 % (ref 0.0–5.0)
HCT: 36.8 % (ref 36.0–46.0)
Hemoglobin: 12.4 g/dL (ref 12.0–15.0)
Lymphocytes Relative: 17.1 % (ref 12.0–46.0)
Lymphs Abs: 1.4 10*3/uL (ref 0.7–4.0)
MCHC: 33.7 g/dL (ref 30.0–36.0)
MCV: 90 fl (ref 78.0–100.0)
Monocytes Absolute: 0.6 10*3/uL (ref 0.1–1.0)
Monocytes Relative: 7.3 % (ref 3.0–12.0)
Neutro Abs: 5.7 10*3/uL (ref 1.4–7.7)
Neutrophils Relative %: 70.5 % (ref 43.0–77.0)
Platelets: 246 10*3/uL (ref 150.0–400.0)
RBC: 4.09 Mil/uL (ref 3.87–5.11)
RDW: 12.6 % (ref 11.5–15.5)
WBC: 8.1 10*3/uL (ref 4.0–10.5)

## 2021-12-20 LAB — COMPREHENSIVE METABOLIC PANEL
ALT: 13 U/L (ref 0–35)
AST: 12 U/L (ref 0–37)
Albumin: 3.7 g/dL (ref 3.5–5.2)
Alkaline Phosphatase: 87 U/L (ref 39–117)
BUN: 22 mg/dL (ref 6–23)
CO2: 32 mEq/L (ref 19–32)
Calcium: 8.8 mg/dL (ref 8.4–10.5)
Chloride: 104 mEq/L (ref 96–112)
Creatinine, Ser: 0.84 mg/dL (ref 0.40–1.20)
GFR: 62.98 mL/min (ref >60.00)
Glucose, Bld: 79 mg/dL (ref 70–99)
Potassium: 4.1 mEq/L (ref 3.5–5.1)
Sodium: 142 mEq/L (ref 135–145)
Total Bilirubin: 0.5 mg/dL (ref 0.2–1.2)
Total Protein: 6.3 g/dL (ref 6.0–8.3)

## 2021-12-20 LAB — BRAIN NATRIURETIC PEPTIDE: Pro B Natriuretic peptide (BNP): 139 pg/mL — ABNORMAL HIGH (ref 0.0–100.0)

## 2021-12-20 MED ORDER — BENZONATATE 100 MG PO CAPS: 100.0000 mg | ORAL_CAPSULE | Freq: Three times a day (TID) | ORAL | 1 refills | Status: DC | PRN

## 2021-12-20 MED ORDER — GUAIFENESIN ER 600 MG PO TB12: 1200.0000 mg | ORAL_TABLET | Freq: Two times a day (BID) | ORAL | 3 refills | Status: DC

## 2021-12-20 MED ORDER — CLOTRIMAZOLE 10 MG MT TROC: 10.0000 mg | Freq: Three times a day (TID) | OROMUCOSAL | 0 refills | Status: DC

## 2021-12-20 NOTE — Assessment & Plan Note (Addendum)
Bilateral on exam , L > R  However this is unchanged from prior. 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 ? ?She has remained compliant with ASA '81mg'$ . Denies SOB, DOE. Will consider low dose lasix due to bilateral nature.  Will follow.  ?

## 2021-12-20 NOTE — Progress Notes (Signed)
? ?Subjective:  ? ? Patient ID: Elizabeth Fernandez, female    DOB: [birth year], 88 y.o.   MRN: [id number] ? ?CC: Elizabeth Fernandez is a 88 y.o. female who presents today for follow up.  ? ?HPI: Cough has somewhat improved. Notices cough more at nighttime when laying down.  ? ?She continues to have nasal congestion. Complains of dryness in mouth, itchy. Wearing dentures.  ? ? ?She describes occasional DOE when walking from driveway to inside. No DOE, SOB today.  ?She regularly is able to work in yard, feeding birds, without CP and without CP.  ? ? ?Using albuterol twice per day with relief.  ? ?Chronic left leg swelling which is unchanged. No calf pain.  ? 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 Compliant with ASA '81mg'$ .  ? ?No fever,  cp. She uses two pillows to sleep.  ? ?Completed with doxycycline, prednisone.  ?H/o emphysema. No h/o smoking. She lived with smoker.  ? ?Follow up cough as seen by colleague 12/11/21. Rapid covid negative.  ?Started on prednisone, doxycycline and albuterol ?Chest x-ray dated 12/12/2021 with no active cardiopulmonary disease ?HISTORY:  ?Past Medical History:  ?Diagnosis Date  ? Arthritis   ? both knees  ? BCC (basal cell carcinoma) 04/28/2021  ? left distal lateral deltoid  ? Cataract   ? Surgery scheduled for 03/2014  ? DVT (deep venous thrombosis) ([place])   ? Left popliteal vein  ? ?Past Surgical History:  ?Procedure Laterality Date  ? CHOLECYSTECTOMY    ? FEMUR SURGERY  11/10/2014  ? HIP ARTHROPLASTY Right 11/09/2017  ? Procedure: ARTHROPLASTY BIPOLAR HIP (HEMIARTHROPLASTY);  Surgeon: Earnestine Leys, MD;  Location: [facility] ORS;  Service: Orthopedics;  Laterality: Right;  ? IR FLUORO GUIDED NEEDLE PLC ASPIRATION/INJECTION LOC  01/25/2017  ? ?Family History  ?Problem Relation Age of Onset  ? Cancer Mother   ? Heart attack Father 60  ? Osteoporosis Neg Hx   ? ? ?Allergies: Pollen extract ?Current  Outpatient Medications on File Prior to Visit  ?Medication Sig Dispense Refill  ? albuterol (VENTOLIN HFA) 108 (90 Base) MCG/ACT inhaler Inhale 2 puffs into the lungs every 6 (six) hours as needed for wheezing or shortness of breath. 8 g 0  ? Ascorbic Acid (VITAMIN C PO) Take 1 tablet by mouth daily.    ? aspirin EC 81 MG tablet Take 81 mg by mouth daily. Swallow whole.    ? CALCIUM PO Take 1 tablet by mouth daily.    ? hydrocortisone 2.5 % lotion Apply topically 2 (two) times daily. Insect bite to face and eyelid itching as needed 59 mL 0  ? ibandronate (BONIVA) 150 MG tablet Take 1 tablet (150 mg total) by mouth every 30 (thirty) days. Take in the morning with a full glass of water, on an empty stomach, and do not take anything else by mouth or lie down for the next 30 min. 3 tablet 3  ? levothyroxine (SYNTHROID) 88 MCG tablet Take 1 tablet (88 mcg total) by mouth daily. 90 tablet 2  ? meloxicam (MOBIC) 7.5 MG tablet Take 1 tablet (7.5 mg total) by mouth daily as needed for pain. 30 tablet 1  ? Multiple Vitamin (MULTIVITAMIN PO) Take 1 tablet by mouth daily.    ? ?No current facility-administered medications on file prior to visit.  ? ? ?Social History  ? ?Tobacco Use  ? Smoking status: Never  ?  Smokeless tobacco: Never  ?Vaping Use  ? Vaping Use: Never used  ?Substance Use Topics  ? Alcohol use: No  ?  Alcohol/week: 0.0 standard drinks  ? Drug use: No  ? ? ?Review of Systems  ?Constitutional:  Negative for chills and fever.  ?HENT:  Positive for congestion.   ?Respiratory:  Positive for cough. Negative for wheezing.   ?Cardiovascular:  Positive for leg swelling. Negative for chest pain and palpitations.  ?Gastrointestinal:  Negative for nausea and vomiting.  ?   ?Objective:  ?  ?BP 120/60 (BP Location: Left Arm, Patient Position: Sitting, Cuff Size: Large)   Pulse 85   Temp 98.3 ?F (36.8 ?C) (Oral)   Ht '5\' 4"'$  (1.626 m)   Wt 156 lb 9.6 oz (71 kg)   SpO2 96%   BMI 26.88 kg/m?  ?BP Readings from Last 3  Encounters:  ?12/20/21 120/60  ?12/11/21 110/80  ?09/06/21 (!) 150/78  ? ?Wt Readings from Last 3 Encounters:  ?12/20/21 156 lb 9.6 oz (71 kg)  ?12/11/21 156 lb 3.2 oz (70.9 kg)  ?11/30/21 155 lb (70.3 kg)  ? ? ?Physical Exam ?Vitals reviewed.  ?Constitutional:   ?   Appearance: She is well-developed.  ?HENT:  ?   Head: Normocephalic and atraumatic.  ?   Right Ear: Hearing, tympanic membrane, ear canal and external ear normal. No decreased hearing noted. No drainage, swelling or tenderness. No middle ear effusion. No foreign body. Tympanic membrane is not erythematous or bulging.  ?   Left Ear: Hearing, tympanic membrane, ear canal and external ear normal. No decreased hearing noted. No drainage, swelling or tenderness.  No middle ear effusion. No foreign body. Tympanic membrane is not erythematous or bulging.  ?   Nose: Nose normal. No rhinorrhea.  ?   Right Sinus: No maxillary sinus tenderness or frontal sinus tenderness.  ?   Left Sinus: No maxillary sinus tenderness or frontal sinus tenderness.  ?   Mouth/Throat:  ?   Pharynx: Uvula midline. No oropharyngeal exudate or posterior oropharyngeal erythema.  ?   Tonsils: No tonsillar abscesses.  ?   Comments: White exudate noted on tongue. ?Dentures in place.  ?Eyes:  ?   Conjunctiva/sclera: Conjunctivae normal.  ?Cardiovascular:  ?   Rate and Rhythm: Regular rhythm.  ?   Pulses: Normal pulses.  ?   Heart sounds: Normal heart sounds.  ?   Comments: BLE +1 pitting edema. L > R.  ?No palpable cords or masses. No erythema or increased warmth. ?No asymmetry in calf size when compared bilaterally ?LE hair growth symmetric and present. No discoloration or varicosities noted. ?LE warm and palpable pedal pulses. ? ?Pulmonary:  ?   Effort: Pulmonary effort is normal.  ?   Breath sounds: Normal breath sounds. No wheezing, rhonchi or rales.  ?Musculoskeletal:  ?   Right lower leg: 1+ Edema present.  ?   Left lower leg: 2+ Edema present.  ?Lymphadenopathy:  ?   Head:  ?   Right  side of head: No submental, submandibular, tonsillar, preauricular, posterior auricular or occipital adenopathy.  ?   Left side of head: No submental, submandibular, tonsillar, preauricular, posterior auricular or occipital adenopathy.  ?   Cervical: No cervical adenopathy.  ?Skin: ?   General: Skin is warm and dry.  ?Neurological:  ?   Mental Status: She is alert.  ?Psychiatric:     ?   Speech: Speech normal.     ?   Behavior: Behavior normal.     ?  Thought Content: Thought content normal.  ? ? ?   ?Assessment & Plan:  ? ?Problem List Items Addressed This Visit   ? ?  ? Digestive  ? Thrush  ?  Evidence of thrush on exam.  Start clotrimazole.  ? ?  ?  ? Relevant Medications  ? clotrimazole (MYCELEX) 10 MG troche  ?  ? Other  ? Acute cough - Primary  ?  Prior history on chart of emphysema.  Patient does not have smoking history though she has secondhand smoke exposure from husband.No formal evaluation for COPD. Some improvement with doxycycline, prednisone.  No acute respiratory distress today and she is well appearing today.  She denies shortness of breath, dyspnea on exertion today.  Chronic left DVT without changes from prior.  Advised to start Tessalon and Mucinex for residual congestion, cough.  Continue albuterol as needed.  Pending echocardiogram, BNP.  If echocardiogram, BNP is negative for CHF, will arrange consult pulmonology for further evaluation of obstructive lung disease and advise trial of Symbicort and/or antacid if symptoms persist.  Patient will let me know how she is doing. ? ?  ?  ? Relevant Medications  ? guaiFENesin (MUCINEX) 600 MG 12 hr tablet  ? benzonatate (TESSALON) 100 MG capsule  ? Other Relevant Orders  ? ECHOCARDIOGRAM COMPLETE  ? Brain natriuretic peptide  ? CBC with Differential/Platelet  ? Comprehensive metabolic panel  ? Left leg swelling  ?  Bilateral on exam , L > R  However this is unchanged from prior. [place] left leg 02/2021 shows left leg Chronic occlusive DVT involving the left  popliteal vein, similar to the 03/2017 examination, without evidence of propagation. ? ?She has remained compliant with ASA '81mg'$ . Denies SOB, DOE. Will consider low dose lasix due to bilateral nature.  Will follow.

## 2021-12-20 NOTE — Assessment & Plan Note (Signed)
Evidence of thrush on exam.  Start clotrimazole.  ?

## 2021-12-20 NOTE — Assessment & Plan Note (Addendum)
Prior history on chart of emphysema.  Patient does not have smoking history though she has secondhand smoke exposure from husband.No formal evaluation for COPD. Some improvement with doxycycline, prednisone.  No acute respiratory distress today and she is well appearing today.  She denies shortness of breath, dyspnea on exertion today.  Chronic left DVT without changes from prior.  Advised to start Tessalon and Mucinex for residual congestion, cough.  Continue albuterol as needed.  Pending echocardiogram, BNP.  If echocardiogram, BNP is negative for CHF, will arrange consult pulmonology for further evaluation of obstructive lung disease and advise trial of Symbicort and/or antacid if symptoms persist.  Patient will let me know how she is doing. ?

## 2021-12-22 ENCOUNTER — Telehealth: Payer: Self-pay | Admitting: Family

## 2021-12-22 MED ORDER — NYSTATIN 100000 UNIT/ML MT SUSP: 5.0000 mL | Freq: Four times a day (QID) | OROMUCOSAL | 0 refills | Status: DC

## 2021-12-22 NOTE — Telephone Encounter (Signed)
LMTCB did let patient know that prescription was sent in.

## 2021-12-22 NOTE — Telephone Encounter (Signed)
I sent nystatin for the patient.

## 2021-12-22 NOTE — Telephone Encounter (Signed)
Pt called stating she want a mouthwash for thrush. Pt stated that the tablets did help a little

## 2021-12-22 NOTE — Telephone Encounter (Signed)
Pt called back & advised that nystatin was sent.

## 2022-01-02 ENCOUNTER — Other Ambulatory Visit: Payer: PPO

## 2022-01-03 ENCOUNTER — Other Ambulatory Visit: Payer: PPO

## 2022-01-09 ENCOUNTER — Ambulatory Visit
Admission: RE | Admit: 2022-01-09 | Discharge: 2022-01-09 | Disposition: A | Discharge status: Home / Self Care | Payer: PPO | Source: Ambulatory Visit | Attending: Family | Admitting: Family

## 2022-01-09 DIAGNOSIS — R601 Generalized edema: Secondary | ICD-10-CM | POA: Diagnosis not present

## 2022-01-09 DIAGNOSIS — J439 Emphysema, unspecified: Secondary | ICD-10-CM | POA: Insufficient documentation

## 2022-01-09 DIAGNOSIS — R051 Acute cough: Secondary | ICD-10-CM | POA: Diagnosis not present

## 2022-01-09 DIAGNOSIS — I081 Rheumatic disorders of both mitral and tricuspid valves: Secondary | ICD-10-CM | POA: Insufficient documentation

## 2022-01-09 DIAGNOSIS — R600 Localized edema: Secondary | ICD-10-CM | POA: Diagnosis not present

## 2022-01-09 LAB — ECHOCARDIOGRAM COMPLETE
AR max vel: 1.42 cm2
AV Area VTI: 1.52 cm2
AV Area mean vel: 1.39 cm2
AV Mean grad: 4 mmHg
AV Peak grad: 6.1 mmHg
Ao pk vel: 1.23 m/s
Area-P 1/2: 4.36 cm2
MV VTI: 1.11 cm2
S' Lateral: 2.48 cm

## 2022-01-16 ENCOUNTER — Other Ambulatory Visit: Payer: Self-pay

## 2022-01-18 ENCOUNTER — Encounter: Payer: Self-pay | Admitting: Internal Medicine

## 2022-01-18 ENCOUNTER — Ambulatory Visit (INDEPENDENT_AMBULATORY_CARE_PROVIDER_SITE_OTHER): Payer: PPO | Admitting: Internal Medicine

## 2022-01-18 DIAGNOSIS — L237 Allergic contact dermatitis due to plants, except food: Secondary | ICD-10-CM | POA: Diagnosis not present

## 2022-01-18 MED ORDER — METHYLPREDNISOLONE 4 MG PO TBPK: ORAL_TABLET | ORAL | 0 refills | Status: DC

## 2022-01-18 MED ORDER — CLOBETASOL PROPIONATE 0.05 % EX CREA: 1.0000 | TOPICAL_CREAM | Freq: Two times a day (BID) | CUTANEOUS | 0 refills | Status: DC

## 2022-01-18 NOTE — Patient Instructions (Signed)
Poison Ivy Dermatitis Poison ivy dermatitis is inflammation of the skin that is caused by chemicals in the leaves of the poison ivy plant. The skin reaction often involves redness, swelling, blisters, and extreme itching. What are the causes? This condition is caused by a chemical (urushiol) found in the sap of the poison ivy plant. This chemical is sticky and can be easily spread to people, animals, and objects. You can get poison ivy dermatitis by: Having direct contact with a poison ivy plant. Touching animals, other people, or objects that have come in contact with poison ivy and have the chemical on them. What increases the risk? This condition is more likely to develop in people who: Are outdoors often in wooded or [place] areas. Go outdoors without wearing protective clothing, such as closed shoes, long pants, and a long-sleeved shirt. What are the signs or symptoms? Symptoms of this condition include: Redness of the skin. Extreme itching. A rash that often includes bumps and blisters. The rash usually appears 48 hours after exposure, if you have been exposed before. If this is the first time you have been exposed, the rash may not appear until a week after exposure. Swelling. This may occur if the reaction is more severe. Symptoms usually last for 1-2 weeks. However, the first time you develop this condition, symptoms may last 3-4 weeks. How is this diagnosed? This condition may be diagnosed based on your symptoms and a physical exam. Your health care provider may also ask you about any recent outdoor activity. How is this treated? Treatment for this condition will vary depending on how severe it is. Treatment may include: Hydrocortisone cream or calamine lotion to relieve itching. Oatmeal baths to soothe the skin. Medicines, such as over-the-counter antihistamine tablets. Oral steroid medicine, for more severe reactions. Follow these instructions at home: Medicines Take or apply  over-the-counter and prescription medicines only as told by your health care provider. Use hydrocortisone cream or calamine lotion as needed to soothe the skin and relieve itching. General instructions Do not scratch or rub your skin. Apply a cold, wet cloth (cold compress) to the affected areas or take baths in cool water. This will help with itching. Avoid hot baths and showers. Take oatmeal baths as needed. Use colloidal oatmeal. You can get this at your local pharmacy or grocery store. Follow the instructions on the packaging. While you have the rash, wash clothes right after you wear them. Keep all follow-up visits as told by your health care provider. This is important. How is this prevented?  Learn to identify the poison ivy plant and avoid contact with the plant. This plant can be recognized by the number of leaves. Generally, poison ivy has three leaves with flowering branches on a single stem. The leaves are typically glossy, and they have jagged edges that come to a point at the front. If you have been exposed to poison ivy, thoroughly wash with soap and water right away. You have about 30 minutes to remove the plant resin before it will cause the rash. Be sure to wash under your fingernails, because any plant resin there will continue to spread the rash. When hiking or camping, wear clothes that will help you to avoid exposure on the skin. This includes long pants, a long-sleeved shirt, tall socks, and hiking boots. You can also apply preventive lotion to your skin to help limit exposure. If you suspect that your clothes or outdoor gear came in contact with poison ivy, rinse them off outside  with a garden hose before you bring them inside your house. When doing yard work or gardening, wear gloves, long sleeves, long pants, and boots. Wash your garden tools and gloves if they come in contact with poison ivy. If you suspect that your pet has come into contact with poison ivy, wash him or her  with pet shampoo and water. Make sure to wear gloves while washing your pet. Contact a health care provider if you have: Open sores in the rash area. More redness, swelling, or pain in the affected area. Redness that spreads beyond the rash area. Fluid, blood, or pus coming from the affected area. A fever. A rash over a large area of your body. A rash on your eyes, mouth, or genitals. A rash that does not improve after a few weeks. Get help right away if: Your face swells or your eyes swell shut. You have trouble breathing. You have trouble swallowing. These symptoms may represent a serious problem that is an emergency. Do not wait to see if the symptoms will go away. Get medical help right away. Call your local emergency services (911 in the [place] Do not drive yourself to the hospital. Summary Poison ivy dermatitis is inflammation of the skin that is caused by chemicals in the leaves of the poison ivy plant. Symptoms of this condition include redness, itching, a rash, and swelling. Do not scratch or rub your skin. Take or apply over-the-counter and prescription medicines only as told by your health care provider. This information is not intended to replace advice given to you by your health care provider. Make sure you discuss any questions you have with your health care provider. Document Revised: 05/08/2021 Document Reviewed: 05/08/2021 Elsevier Patient Education  2023 [employer]  

## 2022-01-18 NOTE — Progress Notes (Signed)
Telephone Note  I connected with Elizabeth Fernandez  on 01/18/22 at  2:20 PM EDT by telephone and verified that I am speaking with the correct person using two identifiers.  Location patient: [place] Location provider:work or home office Persons participating in the virtual visit: patient, provider  I discussed the limitations and requested verbal permission for telemedicine visit. The patient expressed understanding and agreed to proceed.   HPI:  Acute telemedicine visit for : 4 days ago had small amounts poison ivy b/l wrist right arm inside of wrist past elbow and left arm inside. Tried hc otc and rexol spray w/o relief. She has benaryl at home   -Pertinent past medical history: see below -Pertinent medication allergies: Allergies  Allergen Reactions   Pollen Extract    -COVID-19 vaccine status:  Immunization History  Administered Date(s) Administered   Fluad Quad(high Dose 65+) 04/30/2019   Influenza, High Dose Seasonal PF 06/19/2016, 05/09/2018, 05/23/2021   Influenza,inj,Quad PF,6+ Mos 07/26/2015   Influenza-Unspecified 06/06/2020   Moderna SARS-COV2 Booster Vaccination 07/24/2020   PFIZER(Purple Top)SARS-COV-2 Vaccination 08/21/2019, 09/11/2019   PPD Test 11/18/2014   Pneumococcal Conjugate-13 10/24/2016   Pneumococcal Polysaccharide-23 04/30/2019     ROS: See pertinent positives and negatives per HPI.  Past Medical History:  Diagnosis Date   Arthritis    both knees   BCC (basal cell carcinoma) 04/28/2021   left distal lateral deltoid   Cataract    Surgery scheduled for 03/2014   DVT (deep venous thrombosis) (HCC)    Left popliteal vein    Past Surgical History:  Procedure Laterality Date   CHOLECYSTECTOMY     FEMUR SURGERY  11/10/2014   HIP ARTHROPLASTY Right 11/09/2017   Procedure: ARTHROPLASTY BIPOLAR HIP (HEMIARTHROPLASTY);  Surgeon: Earnestine Leys, MD;  Location: [facility] ORS;  Service: Orthopedics;  Laterality: Right;   IR FLUORO GUIDED NEEDLE PLC  ASPIRATION/INJECTION LOC  01/25/2017     Current Outpatient Medications:    albuterol (VENTOLIN HFA) 108 (90 Base) MCG/ACT inhaler, Inhale 2 puffs into the lungs every 6 (six) hours as needed for wheezing or shortness of breath., Disp: 8 g, Rfl: 0   Ascorbic Acid (VITAMIN C PO), Take 1 tablet by mouth daily., Disp: , Rfl:    aspirin EC 81 MG tablet, Take 81 mg by mouth daily. Swallow whole., Disp: , Rfl:    benzonatate (TESSALON) 100 MG capsule, Take 1 capsule (100 mg total) by mouth 3 (three) times daily as needed for cough., Disp: 20 capsule, Rfl: 1   CALCIUM PO, Take 1 tablet by mouth daily., Disp: , Rfl:    clobetasol cream (TEMOVATE) 5.95 %, Apply 1 Application topically 2 (two) times daily. Please deliver, Disp: 60 g, Rfl: 0   guaiFENesin (MUCINEX) 600 MG 12 hr tablet, Take 2 tablets (1,200 mg total) by mouth 2 (two) times daily., Disp: 28 tablet, Rfl: 3   hydrocortisone 2.5 % lotion, Apply topically 2 (two) times daily. Insect bite to face and eyelid itching as needed, Disp: 59 mL, Rfl: 0   ibandronate (BONIVA) 150 MG tablet, Take 1 tablet (150 mg total) by mouth every 30 (thirty) days. Take in the morning with a full glass of water, on an empty stomach, and do not take anything else by mouth or lie down for the next 30 min., Disp: 3 tablet, Rfl: 3   levothyroxine (SYNTHROID) 88 MCG tablet, Take 1 tablet (88 mcg total) by mouth daily., Disp: 90 tablet, Rfl: 2   meloxicam (MOBIC) 7.5 MG tablet, Take 1  tablet (7.5 mg total) by mouth daily as needed for pain., Disp: 30 tablet, Rfl: 1   methylPREDNISolone (MEDROL DOSEPAK) 4 MG TBPK tablet, Use as directed with food in the AM. Please deliver, Disp: 21 tablet, Rfl: 0   Multiple Vitamin (MULTIVITAMIN PO), Take 1 tablet by mouth daily., Disp: , Rfl:    nystatin (MYCOSTATIN) 100000 UNIT/ML suspension, Take 5 mLs (500,000 Units total) by mouth 4 (four) times daily. For 7-14 days, Disp: 180 mL, Rfl: 0  EXAM:  VITALS per patient if  applicable:  GENERAL: alert, oriented, appears well and in no acute distress  PSYCH/NEURO: pleasant and cooperative, no obvious depression or anxiety, speech and thought processing grossly intact  ASSESSMENT AND PLAN:  Discussed the following assessment and plan:  Poison ivy - Plan: clobetasol cream (TEMOVATE) 0.05 % bid methylPREDNISolone (MEDROL DOSEPAK) 4 MG TBPK tablet Can try 1/2 dose benadyl at night as well   -we discussed possible serious and likely etiologies, options for evaluation and workup, limitations of telemedicine visit vs in person visit, treatment, treatment risks and precautions. Pt is agreeable to treatment via telemedicine at this moment.    I discussed the assessment and treatment plan with the patient. The patient was provided an opportunity to ask questions and all were answered. The patient agreed with the plan and demonstrated an understanding of the instructions.    Time spent 20 min Delorise Jackson, MD

## 2022-02-12 ENCOUNTER — Telehealth: Payer: Self-pay | Admitting: Family

## 2022-02-12 NOTE — Telephone Encounter (Signed)
Call pt  I discussed echocardiogram with Dr end cardiologist whom advised she may benefit from low dose of diuretic  Is she still having cough?  Does she have leg swelling?  Is she willing to have visit ( virtual okay) to discuss medication above?  She would need to come in if symptoms have worsened

## 2022-02-12 NOTE — Telephone Encounter (Signed)
-----   Message from Nelva Bush, MD sent at 01/16/2022  1:19 PM EDT ----- I would probably start her on a little bit of diuretic to see if that helps improve her symptoms.  If not, we can certainly see her back in the office.  I would suspect that her elevated pulmonary pressures are most likely due to left-sided heart disease (LVH and MR).  If diuresis does not make any improvement, pulmonary consultation could be considered down the road.  I hope that is helpful.  Let me know if any other questions or concerns come up.  Thanks.  Gerald Stabs ----- Message ----- From: Burnard Hawthorne, FNP Sent: 01/16/2022  12:54 PM EDT To: Nelva Bush, MD  Dr End,  [facility] you are well.   I had ordered echocardiogram in the setting of leg swelling, cough. Echo shows Mild left ventricular hypertrophy, increased pulmonary pressure, and mitral valve regurgitation present.  BNP ever so slightly elevated at 139.   Do you think appropriate to follow up with you or do you think with increased pulmonary pressure and COPD ( no formal testing)  a referral to pulmonology would be a better next step?   Joycelyn Schmid

## 2022-02-13 NOTE — Telephone Encounter (Signed)
Spoke to patient and she stated that she was feeling better and did not think she needed to come into the office but did say that if you thought she absolutely needed the diuretic that she would be willing to take it. No coughing or swelling at this time though

## 2022-02-14 NOTE — Telephone Encounter (Signed)
If she has no cough, leg swelling We can hold on starting diuretic

## 2022-02-20 ENCOUNTER — Ambulatory Visit (INDEPENDENT_AMBULATORY_CARE_PROVIDER_SITE_OTHER): Payer: PPO | Admitting: Dermatology

## 2022-02-20 ENCOUNTER — Encounter: Payer: Self-pay | Admitting: Dermatology

## 2022-02-20 DIAGNOSIS — C44619 Basal cell carcinoma of skin of left upper limb, including shoulder: Secondary | ICD-10-CM

## 2022-02-20 MED ORDER — MUPIROCIN 2 % EX OINT: 1.0000 | TOPICAL_OINTMENT | Freq: Every day | CUTANEOUS | 1 refills | Status: DC

## 2022-02-20 NOTE — Patient Instructions (Signed)

## 2022-02-20 NOTE — Progress Notes (Signed)
   Follow-Up Visit   Subjective  Elizabeth Fernandez is a 88 y.o. female who presents for the following: BCC bx proven (L distal lat deltoid, pt presents for treatment).  The following portions of the chart were reviewed this encounter and updated as appropriate:   Tobacco  Allergies  Meds  Problems  Med Hx  Surg Hx  Fam Hx     Review of Systems:  No other skin or systemic complaints except as noted in HPI or Assessment and Plan.  Objective  Well appearing patient in no apparent distress; mood and affect are within normal limits.  A focused examination was performed including left arm. Relevant physical exam findings are noted in the Assessment and Plan.  L distal lat deltoid Pink bx site 2.5 x 1.3cm   Assessment & Plan  Basal cell carcinoma (BCC) of skin of left upper extremity including shoulder L distal lat deltoid  Skin excision  Lesion length (cm):  2.5 Lesion width (cm):  1.3 Margin per side (cm):  0.3 Total excision diameter (cm):  3.1 Informed consent: discussed and consent obtained   Timeout: patient name, date of birth, surgical site, and procedure verified   Procedure prep:  Patient was prepped and draped in usual sterile fashion Prep type:  Isopropyl alcohol and povidone-iodine Anesthesia: the lesion was anesthetized in a standard fashion   Anesthetic:  1% lidocaine w/ epinephrine 1-100,000 buffered w/ 8.4% NaHCO3 (6cc lido w/ epi, 6cc bupivicaine, Total of 12cc) Instrument used: #15 blade   Hemostasis achieved with: pressure   Hemostasis achieved with comment:  Electrocautery Outcome: patient tolerated procedure well with no complications   Post-procedure details: sterile dressing applied and wound care instructions given   Dressing type: bandage, pressure dressing and bacitracin (Mupirocin)    Skin repair Complexity:  Complex Final length (cm):  5 Reason for type of repair: reduce tension to allow closure, reduce the risk of dehiscence, infection, and  necrosis, reduce subcutaneous dead space and avoid a hematoma, allow closure of the large defect, preserve normal anatomy, preserve normal anatomical and functional relationships and enhance both functionality and cosmetic results   Undermining: area extensively undermined   Undermining comment:  Undermining Defect 1.9cm Subcutaneous layers (deep stitches):  Suture size:  3-0 Suture type: Vicryl (polyglactin 910)   Subcutaneous suture technique: Inverted Dermal. Fine/surface layer approximation (top stitches):  Suture size:  3-0 Suture type: nylon   Stitches: horizontal mattress   Suture removal (days):  7 Hemostasis achieved with: pressure Outcome: patient tolerated procedure well with no complications   Post-procedure details: sterile dressing applied and wound care instructions given   Dressing type: bandage, pressure dressing and bacitracin (Mupirocin)    mupirocin ointment (BACTROBAN) 2 % Apply 1 Application topically daily. Qd to excision site  Specimen 1 - Surgical pathology Differential Diagnosis: Bx proven BCC  Check Margins: yes Pink bx site 2.5 x 1.3cm [phone]  Bx proven, excised today Start Mupirocin oint qd to excision site   Return in about 1 week (around 02/27/2022) for suture removal.   I, Othelia Pulling, RMA, am acting as scribe for Sarina Ser, MD . Documentation: I have reviewed the above documentation for accuracy and completeness, and I agree with the above.  Sarina Ser, MD

## 2022-02-21 ENCOUNTER — Telehealth: Payer: Self-pay

## 2022-02-21 NOTE — Telephone Encounter (Signed)
Pt doing fine after yesterdays surgery./sh 

## 2022-02-27 ENCOUNTER — Ambulatory Visit (INDEPENDENT_AMBULATORY_CARE_PROVIDER_SITE_OTHER): Payer: PPO | Admitting: Dermatology

## 2022-02-27 DIAGNOSIS — L578 Other skin changes due to chronic exposure to nonionizing radiation: Secondary | ICD-10-CM

## 2022-02-27 DIAGNOSIS — Z85828 Personal history of other malignant neoplasm of skin: Secondary | ICD-10-CM

## 2022-02-27 DIAGNOSIS — Z4802 Encounter for removal of sutures: Secondary | ICD-10-CM

## 2022-02-27 NOTE — Progress Notes (Signed)
   Follow-Up Visit   Subjective  Elizabeth Fernandez is a 88 y.o. female who presents for the following: Post op/suture removal (Pathology proven margins free BCC - L distal lat deltoid, patient here today for suture removal).  The following portions of the chart were reviewed this encounter and updated as appropriate:   Tobacco  Allergies  Meds  Problems  Med Hx  Surg Hx  Fam Hx     Review of Systems:  No other skin or systemic complaints except as noted in HPI or Assessment and Plan.  Objective  Well appearing patient in no apparent distress; mood and affect are within normal limits.  A focused examination was performed including the face and extremities. Relevant physical exam findings are noted in the Assessment and Plan.  L distal lat deltoid Healing excision site   Assessment & Plan  History of basal cell carcinoma (BCC) L distal lat deltoid  Encounter for Removal of Sutures - Incision site at the L distal lat deltoid is clean, dry and intact - Wound cleansed, sutures removed, wound cleansed and steri strips applied.  - Discussed pathology results showing a margins free BCC.  - Patient advised to keep steri-strips dry until they fall off. - Scars remodel for a full year. - Once steri-strips fall off, patient can apply over-the-counter silicone scar cream each night to help with scar remodeling if desired. - Patient advised to call with any concerns or if they notice any new or changing lesions.   Actinic Damage - chronic, secondary to cumulative UV radiation exposure/sun exposure over time - diffuse scaly erythematous macules with underlying dyspigmentation - Recommend daily broad spectrum sunscreen SPF 30+ to sun-exposed areas, reapply every 2 hours as needed.  - Recommend staying in the shade or wearing long sleeves, sun glasses (UVA+UVB protection) and wide brim hats (4-inch brim around the entire circumference of the hat). - Call for new or changing  lesions.  Return in about 3 months (around 05/30/2022) for TBSE.  Luther Redo, CMA, am acting as scribe for Sarina Ser, MD . Documentation: I have reviewed the above documentation for accuracy and completeness, and I agree with the above.  Sarina Ser, MD

## 2022-02-27 NOTE — Patient Instructions (Signed)
Due to recent changes in healthcare laws, you may see results of your pathology and/or laboratory studies on MyChart before the doctors have had a chance to review them. We understand that in some cases there may be results that are confusing or concerning to you. Please understand that not all results are received at the same time and often the doctors may need to interpret multiple results in order to provide you with the best plan of care or course of treatment. Therefore, we ask that you please give us 2 business days to thoroughly review all your results before contacting the office for clarification. Should we see a critical lab result, you will be contacted sooner.   If You Need Anything After Your Visit  If you have any questions or concerns for your doctor, please call our main line at [phone] and press option 4 to reach your doctor's medical assistant. If no one answers, please leave a voicemail as directed and we will return your call as soon as possible. Messages left after 4 pm will be answered the following business day.   You may also send us a message via MyChart. We typically respond to MyChart messages within 1-2 business days.  For prescription refills, please ask your pharmacy to contact our office. Our fax number is [phone].  If you have an urgent issue when the clinic is closed that cannot wait until the next business day, you can page your doctor at the number below.    Please note that while we do our best to be available for urgent issues outside of office hours, we are not available 24/7.   If you have an urgent issue and are unable to reach us, you may choose to seek medical care at your doctor's office, retail clinic, urgent care center, or emergency room.  If you have a medical emergency, please immediately call 911 or go to the emergency department.  Pager Numbers  - Dr. Kowalski: [phone]  - Dr. Moye: [phone]  - Dr. Stewart:  [phone]  In the event of inclement weather, please call our main line at [phone] for an update on the status of any delays or closures.  Dermatology Medication Tips: Please keep the boxes that topical medications come in in order to help keep track of the instructions about where and how to use these. Pharmacies typically print the medication instructions only on the boxes and not directly on the medication tubes.   If your medication is too expensive, please contact our office at [phone] option 4 or send us a message through MyChart.   We are unable to tell what your co-pay for medications will be in advance as this is different depending on your insurance coverage. However, we may be able to find a substitute medication at lower cost or fill out paperwork to get insurance to cover a needed medication.   If a prior authorization is required to get your medication covered by your insurance company, please allow us 1-2 business days to complete this process.  Drug prices often vary depending on where the prescription is filled and some pharmacies may offer cheaper prices.  The website www.goodrx.com contains coupons for medications through different pharmacies. The prices here do not account for what the cost may be with help from insurance (it may be cheaper with your insurance), but the website can give you the price if you did not use any insurance.  - You can print the associated coupon and take it with   your prescription to the pharmacy.  - You may also stop by our office during regular business hours and pick up a GoodRx coupon card.  - If you need your prescription sent electronically to a different pharmacy, notify our office through [place] MyChart or by phone at [phone] option 4.     Si Usted Necesita Algo Despus de Su Visita  Tambin puede enviarnos un mensaje a travs de MyChart. Por lo general respondemos a los mensajes de MyChart en el transcurso de 1 a 2  das hbiles.  Para renovar recetas, por favor pida a su farmacia que se ponga en contacto con nuestra oficina. Nuestro nmero de fax es el [phone].  Si tiene un asunto urgente cuando la clnica est cerrada y que no puede esperar hasta el siguiente da hbil, puede llamar/localizar a su doctor(a) al nmero que aparece a continuacin.   Por favor, tenga en cuenta que aunque hacemos todo lo posible para estar disponibles para asuntos urgentes fuera del horario de oficina, no estamos disponibles las 24 horas del da, los 7 das de la semana.   Si tiene un problema urgente y no puede comunicarse con nosotros, puede optar por buscar atencin mdica  en el consultorio de su doctor(a), en una clnica privada, en un centro de atencin urgente o en una sala de emergencias.  Si tiene una emergencia mdica, por favor llame inmediatamente al 911 o vaya a la sala de emergencias.  Nmeros de bper  - Dr. Kowalski: [phone]  - Dra. Moye: [phone]  - Dra. Stewart: [phone]  En caso de inclemencias del tiempo, por favor llame a nuestra lnea principal al [phone] para una actualizacin sobre el estado de cualquier retraso o cierre.  Consejos para la medicacin en dermatologa: Por favor, guarde las cajas en las que vienen los medicamentos de uso tpico para ayudarle a seguir las instrucciones sobre dnde y cmo usarlos. Las farmacias generalmente imprimen las instrucciones del medicamento slo en las cajas y no directamente en los tubos del medicamento.   Si su medicamento es muy caro, por favor, pngase en contacto con nuestra oficina llamando al [phone] y presione la opcin 4 o envenos un mensaje a travs de MyChart.   No podemos decirle cul ser su copago por los medicamentos por adelantado ya que esto es diferente dependiendo de la cobertura de su seguro. Sin embargo, es posible que podamos encontrar un medicamento sustituto a menor costo o llenar un formulario para que el  seguro cubra el medicamento que se considera necesario.   Si se requiere una autorizacin previa para que su compaa de seguros cubra su medicamento, por favor permtanos de 1 a 2 das hbiles para completar este proceso.  Los precios de los medicamentos varan con frecuencia dependiendo del lugar de dnde se surte la receta y alguna farmacias pueden ofrecer precios ms baratos.  El sitio web www.goodrx.com tiene cupones para medicamentos de diferentes farmacias. Los precios aqu no tienen en cuenta lo que podra costar con la ayuda del seguro (puede ser ms barato con su seguro), pero el sitio web puede darle el precio si no utiliz ningn seguro.  - Puede imprimir el cupn correspondiente y llevarlo con su receta a la farmacia.  - Tambin puede pasar por nuestra oficina durante el horario de atencin regular y recoger una tarjeta de cupones de GoodRx.  - Si necesita que su receta se enve electrnicamente a una farmacia diferente, informe a nuestra oficina a travs de MyChart de [place]   o por telfono llamando al [phone] y presione la opcin 4.  

## 2022-03-02 ENCOUNTER — Encounter: Payer: Self-pay | Admitting: Dermatology

## 2022-03-04 ENCOUNTER — Encounter: Payer: Self-pay | Admitting: Dermatology

## 2022-03-07 ENCOUNTER — Ambulatory Visit (INDEPENDENT_AMBULATORY_CARE_PROVIDER_SITE_OTHER): Payer: PPO | Admitting: Family

## 2022-03-07 ENCOUNTER — Encounter: Payer: Self-pay | Admitting: Family

## 2022-03-07 VITALS — BP 110/60 | HR 78 | Temp 97.7°F | Ht 63.0 in | Wt 154.2 lb

## 2022-03-07 DIAGNOSIS — M810 Age-related osteoporosis without current pathological fracture: Secondary | ICD-10-CM | POA: Diagnosis not present

## 2022-03-07 DIAGNOSIS — E039 Hypothyroidism, unspecified: Secondary | ICD-10-CM | POA: Diagnosis not present

## 2022-03-07 LAB — VITAMIN D 25 HYDROXY (VIT D DEFICIENCY, FRACTURES): VITD: 30.38 ng/mL (ref 30.00–100.00)

## 2022-03-07 LAB — TSH: TSH: 1.65 u[IU]/mL (ref 0.35–5.50)

## 2022-03-07 MED ORDER — IBANDRONATE SODIUM 150 MG PO TABS: 150.0000 mg | ORAL_TABLET | ORAL | 1 refills | Status: AC

## 2022-03-07 NOTE — Assessment & Plan Note (Addendum)
I have assumed prescribing Boniva as Dr. Loanne Drilling has retired.  I have refilled Boniva today.  Counseled patient on risk biphosphonate's including atypical fracture, osteonecrosis.  I printed information as well on her AVS.  Counseled her on how to take Boniva and the importance of separating it from other medications.  Patient will schedule bone density in April 2024.

## 2022-03-07 NOTE — Assessment & Plan Note (Signed)
Pending TSH.  Continue Synthroid 88 mcg

## 2022-03-07 NOTE — Patient Instructions (Addendum)
Schedule your bone density for April 2024  Please call  and schedule your 3D mammogram and /or bone density scan as we discussed.   [facility]  ( new location in 2023)  [address], [place], [place] [address]  [place], [place]   Please confirm that you are taking synthroid correctly as not doing so can affect thyroid levels  Take Synthroid once a day, every day at the same time before breakfast. Take Synthroid with only water and on an empty stomach. Wait 30 minutes to 1 hour before eating or drinking anything other than water.Please also separated by >4 hours from acid reflux medications, calcium, iron, multivitamins as may interfere with absorption.   Do not take Biotin ( typically found in hair , skin and nails vitamin) supplement at least 3 days prior to thyroid blood draw.    Ibandronate Monthly Tablets What is this medication? IBANDRONATE (i BAN droh nate) prevents and treats osteoporosis. It works by Paramedic stronger and less likely to break (fracture). It belongs to a group of medications called bisphosphonates. This medicine may be used for other purposes; ask your health care provider or pharmacist if you have questions. COMMON BRAND NAME(S): Boniva What should I tell my care team before I take this medication? They need to know if you have any of these conditions: Bleeding disorder Cancer Dental disease Difficulty swallowing Kidney disease Low levels of calcium or other minerals in the blood Low red blood cell level Receiving steroids, such as dexamethasone or prednisone Stomach or intestine problems An unusual or allergic reaction to ibandronate, other medications, foods, dyes, or preservatives Pregnant or trying to get pregnant Breast-feeding How should I use this medication? Take this medication by mouth with a full glass of water. Take it as directed on the prescription label on the same day of each month.Take the  dose right after waking up. Do not eat or drink anything before taking it. Do not take it with any other drink except water. Do not chew or crush the tablet. After taking it, do not eat breakfast, drink, or take any other drugs or vitamins for at least 30 minutes. Sit or stand up for at least 60 minutes after you take it. Do not lie down. Keep taking it unless your health care provider tells you to stop. A special MedGuide will be given to you by the pharmacist with each prescription and refill. Be sure to read this information carefully each time. Talk to your care team about the use of this medication in children. Special care may be needed. Overdosage: If you think you have taken too much of this medicine contact a poison control center or emergency room at once. NOTE: This medicine is only for you. Do not share this medicine with others. What if I miss a dose? If you miss a dose and the next scheduled dose is more than 7 days away, take the missed dose on the morning after you remember. Do not take two doses on the same day. If you miss a dose and the next scheduled dose is only 1 to 7 days away, skip it. Take the next dose on the morning of the next scheduled dose. Do not take two doses on the same day. What may interact with this medication? Aluminum hydroxide Antacids Aspirin Calcium supplements Medications for inflammation, such as ibuprofen, naproxen, other NSAIDs Iron supplements Magnesium supplements Vitamins with minerals This list may not describe all possible  interactions. Give your health care provider a list of all the medicines, herbs, non-prescription drugs, or dietary supplements you use. Also tell them if you smoke, drink alcohol, or use illegal drugs. Some items may interact with your medicine. What should I watch for while using this medication? Visit your care team for regular checks on your progress. It may be some time before you see the benefit from this medication. Some  people who take this medication have severe bone, joint, or muscle pain. This medication may also increase your risk for jaw problems or a broken thigh bone. Tell your care team right away if you have severe pain in your jaw, bones, joints, or muscles. Tell your care team if you have any pain that does not go away or that gets worse. Tell your dentist and dental surgeon that you are taking this medication. You should not have major dental surgery while on this medication. See your dentist to have a dental exam and fix any dental problems before starting this medication. Take good care of your teeth while on this medication. Make sure you see your dentist for regular follow-up appointments. You should make sure you get enough calcium and vitamin D while you are taking this medication. Discuss the foods you eat and the vitamins you take with your care team. You may need bloodwork while taking this medication. What side effects may I notice from receiving this medication? Side effects that you should report to your care team as soon as possible: Allergic reactions--skin rash, itching, hives, swelling of the face, lips, tongue, or throat Low calcium level--muscle pain or cramps, confusion, tingling, or numbness in the hands or feet Osteonecrosis of the jaw--pain, swelling, or redness in the mouth, numbness of the jaw, poor healing after dental work, unusual discharge from the mouth, visible bones in the mouth Pain or trouble swallowing Severe bone, joint, or muscle pain Stomach bleeding--bloody or black, tar-like stools, vomiting blood or brown material that looks like coffee grounds Side effects that usually do not require medical attention (report to your care team if they continue or are bothersome): Back pain Diarrhea Headache Nausea Stomach pain This list may not describe all possible side effects. Call your doctor for medical advice about side effects. You may report side effects to FDA at  1-800-FDA-1088. Where should I keep my medication? Keep out of the reach of children and pets. Store between 15 and 30 degrees C (59 and 86 degrees F). Get rid of any unused medication after the expiration date. To get rid of medications that are no longer needed or have expired: Take the medication to a medication take-back program. Check with your pharmacy or law enforcement to find a location. If you cannot return the medication, check the label or package insert to see if the medication should be thrown out in the garbage or flushed down the toilet. If you are not sure, ask your care team. If it is safe to put it in the trash, pour the medication out of the container. Mix the medication with cat litter, dirt, coffee grounds, or other unwanted substance. Seal the mixture in a bag or container. Put it in the trash. NOTE: This sheet is a summary. It may not cover all possible information. If you have questions about this medicine, talk to your doctor, pharmacist, or health care provider.  2023 Elsevier/Gold Standard (2021-09-11 00:00:00)

## 2022-03-07 NOTE — Progress Notes (Signed)
Subjective:    Patient ID: Elizabeth Fernandez, female    DOB: 06/17/1934, 88 y.o.   MRN: [id number]  CC: ATIANA Fernandez is a 88 y.o. female who presents today for follow up.   HPI: Feels well today No new complaints.   She is compliant with vitamin D and calcium.  No trouble swallowing.  She has not taken Boniva in several months since Dr. Loanne Drilling has retired She is compliant with Synthroid 88 mcg.     No cough, leg swelling  HISTORY:  Past Medical History:  Diagnosis Date   Arthritis    both knees   BCC (basal cell carcinoma) 04/28/2021   left distal lateral deltoid, excised 02/20/2022   Cataract    Surgery scheduled for 03/2014   DVT (deep venous thrombosis) (HCC)    Left popliteal vein   Past Surgical History:  Procedure Laterality Date   CHOLECYSTECTOMY     FEMUR SURGERY  11/10/2014   HIP ARTHROPLASTY Right 11/09/2017   Procedure: ARTHROPLASTY BIPOLAR HIP (HEMIARTHROPLASTY);  Surgeon: Earnestine Leys, MD;  Location: [facility] ORS;  Service: Orthopedics;  Laterality: Right;   IR FLUORO GUIDED NEEDLE PLC ASPIRATION/INJECTION LOC  01/25/2017   Family History  Problem Relation Age of Onset   Cancer Mother    Heart attack Father 64   Osteoporosis Neg Hx     Allergies: Pollen extract Current Outpatient Medications on File Prior to Visit  Medication Sig Dispense Refill   Ascorbic Acid (VITAMIN C PO) Take 1 tablet by mouth daily.     aspirin EC 81 MG tablet Take 81 mg by mouth daily. Swallow whole.     CALCIUM PO Take 1 tablet by mouth daily.     hydrocortisone 2.5 % lotion Apply topically 2 (two) times daily. Insect bite to face and eyelid itching as needed 59 mL 0   levothyroxine (SYNTHROID) 88 MCG tablet Take 1 tablet (88 mcg total) by mouth daily. 90 tablet 2   meloxicam (MOBIC) 7.5 MG tablet Take 1 tablet (7.5 mg total) by mouth daily as needed for pain. 30 tablet 1   Multiple Vitamin (MULTIVITAMIN PO) Take 1 tablet by mouth daily.     mupirocin ointment (BACTROBAN) 2 % Apply 1  Application topically daily. Qd to excision site 22 g 1   nystatin (MYCOSTATIN) 100000 UNIT/ML suspension Take 5 mLs (500,000 Units total) by mouth 4 (four) times daily. For 7-14 days 180 mL 0   No current facility-administered medications on file prior to visit.    Social History   Tobacco Use   Smoking status: Never   Smokeless tobacco: Never  Vaping Use   Vaping Use: Never used  Substance Use Topics   Alcohol use: No    Alcohol/week: 0.0 standard drinks of alcohol   Drug use: No    Review of Systems  Constitutional:  Negative for chills and fever.  Respiratory:  Negative for cough.   Cardiovascular:  Negative for chest pain and palpitations.  Gastrointestinal:  Negative for nausea and vomiting.      Objective:    BP 110/60   Pulse 78   Temp 97.7 F (36.5 C) (Oral)   Ht '5\' 3"'$  (1.6 m)   Wt 154 lb 3.2 oz (69.9 kg)   SpO2 96%   BMI 27.32 kg/m  BP Readings from Last 3 Encounters:  03/07/22 110/60  12/20/21 120/60  12/11/21 110/80   Wt Readings from Last 3 Encounters:  03/07/22 154 lb 3.2 oz (69.9 kg)  12/20/21 156 lb 9.6 oz (71 kg)  12/11/21 156 lb 3.2 oz (70.9 kg)    Physical Exam Vitals reviewed.  Constitutional:      Appearance: She is well-developed.  Eyes:     Conjunctiva/sclera: Conjunctivae normal.  Cardiovascular:     Rate and Rhythm: Normal rate and regular rhythm.     Pulses: Normal pulses.     Heart sounds: Normal heart sounds.  Pulmonary:     Effort: Pulmonary effort is normal.     Breath sounds: Normal breath sounds. No wheezing, rhonchi or rales.  Skin:    General: Skin is warm and dry.  Neurological:     Mental Status: She is alert.  Psychiatric:        Speech: Speech normal.        Behavior: Behavior normal.        Thought Content: Thought content normal.        Assessment & Plan:   Problem List Items Addressed This Visit       Endocrine   Hypothyroidism    Pending TSH.  Continue Synthroid 88 mcg      Relevant Orders    TSH     Musculoskeletal and Integument   Osteoporosis - Primary    I have assumed prescribing Boniva as Dr. Loanne Drilling has retired.  I have refilled Boniva today.  Counseled patient on risk biphosphonate's including atypical fracture, osteonecrosis.  I printed information as well on her AVS.  Counseled her on how to take Boniva and the importance of separating it from other medications.  Patient will schedule bone density in April 2024.      Relevant Medications   ibandronate (BONIVA) 150 MG tablet   Other Relevant Orders   DG Bone Density   VITAMIN D 25 Hydroxy (Vit-D Deficiency, Fractures)     I have discontinued Sharniece C. Niccoli's albuterol, guaiFENesin, benzonatate, clobetasol cream, and methylPREDNISolone. I am also having her maintain her Multiple Vitamin (MULTIVITAMIN PO), CALCIUM PO, Ascorbic Acid (VITAMIN C PO), hydrocortisone, aspirin EC, meloxicam, levothyroxine, nystatin, mupirocin ointment, and ibandronate.   Meds ordered this encounter  Medications   ibandronate (BONIVA) 150 MG tablet    Sig: Take 1 tablet (150 mg total) by mouth every 30 (thirty) days. Take in the morning with a full glass of water, on an empty stomach, and do not take anything else by mouth or lie down for the next 30 min.    Dispense:  6 tablet    Refill:  1    Order Specific Question:   Supervising Provider    Answer:   Crecencio Mc [[id number]]    Return precautions given.   Risks, benefits, and alternatives of the medications and treatment plan prescribed today were discussed, and patient expressed understanding.   Education regarding symptom management and diagnosis given to patient on AVS.  Continue to follow with Burnard Hawthorne, FNP for routine health maintenance.   Cassandria Santee and I agreed with plan.   Mable Paris, FNP

## 2022-04-26 ENCOUNTER — Telehealth: Payer: Self-pay | Admitting: Family

## 2022-04-26 DIAGNOSIS — B370 Candidal stomatitis: Secondary | ICD-10-CM

## 2022-04-26 NOTE — Telephone Encounter (Signed)
Patient is requesting a refill on her thrush medication. She would like the tablets, she was the mouth wash.

## 2022-04-27 NOTE — Telephone Encounter (Signed)
Spoke to patient and she stated that the mouth wash has helped so she does not need the thrush medication now.

## 2022-04-30 MED ORDER — CLOTRIMAZOLE 10 MG MT TROC: 10.0000 mg | Freq: Three times a day (TID) | OROMUCOSAL | 0 refills | Status: AC

## 2022-04-30 NOTE — Telephone Encounter (Signed)
Call pt I sent in oral troche. She should not need rinse as well If persists, please sch an appt for in person

## 2022-04-30 NOTE — Telephone Encounter (Signed)
Pt called stating she thought she did not need the medication but she does. Thrush medication and the rinse

## 2022-04-30 NOTE — Addendum Note (Signed)
Addended by: Burnard Hawthorne on: 04/30/2022 04:45 PM   Modules accepted: Orders

## 2022-05-01 NOTE — Telephone Encounter (Signed)
Called patient but was unable to leave Vm

## 2022-05-02 ENCOUNTER — Other Ambulatory Visit: Payer: Self-pay | Admitting: Family Medicine

## 2022-05-02 NOTE — Telephone Encounter (Signed)
Pt notified that medication was sent.

## 2022-05-03 NOTE — Telephone Encounter (Addendum)
Spoke to patient and she stated that she was picking up rx today and she stated that she would make an appt after she tried new rx if it did not work.

## 2022-05-10 ENCOUNTER — Other Ambulatory Visit: Payer: Self-pay | Admitting: Family

## 2022-05-10 DIAGNOSIS — E039 Hypothyroidism, unspecified: Secondary | ICD-10-CM

## 2022-06-25 ENCOUNTER — Ambulatory Visit: Payer: PPO | Admitting: Dermatology

## 2022-07-11 ENCOUNTER — Telehealth: Payer: Self-pay | Admitting: Family

## 2022-07-11 DIAGNOSIS — R051 Acute cough: Secondary | ICD-10-CM

## 2022-07-11 NOTE — Telephone Encounter (Signed)
Patient would like to refill on her Alvuterol. She states it has been awhile since she refilled it.

## 2022-07-13 MED ORDER — ALBUTEROL SULFATE HFA 108 (90 BASE) MCG/ACT IN AERS: 2.0000 | INHALATION_SPRAY | Freq: Four times a day (QID) | RESPIRATORY_TRACT | 0 refills | Status: DC | PRN

## 2022-07-13 NOTE — Telephone Encounter (Signed)
Call pt Refilled albuterol inhaler Please ensure this what she wanted as typo is first message Please ensure no acute sob, cp, coughing which would warrant visit

## 2022-07-13 NOTE — Addendum Note (Signed)
Addended by: Burnard Hawthorne on: 07/13/2022 04:54 PM   Modules accepted: Orders

## 2022-08-15 DIAGNOSIS — M25561 Pain in right knee: Secondary | ICD-10-CM | POA: Diagnosis not present

## 2022-08-15 DIAGNOSIS — S72114A Nondisplaced fracture of greater trochanter of right femur, initial encounter for closed fracture: Secondary | ICD-10-CM | POA: Diagnosis not present

## 2022-09-07 ENCOUNTER — Encounter: Payer: Self-pay | Admitting: Family

## 2022-09-07 ENCOUNTER — Ambulatory Visit (INDEPENDENT_AMBULATORY_CARE_PROVIDER_SITE_OTHER): Payer: PPO | Admitting: Family

## 2022-09-07 VITALS — BP 130/72 | HR 82 | Temp 97.5°F | Ht 64.0 in | Wt 149.4 lb

## 2022-09-07 DIAGNOSIS — M810 Age-related osteoporosis without current pathological fracture: Secondary | ICD-10-CM

## 2022-09-07 DIAGNOSIS — E039 Hypothyroidism, unspecified: Secondary | ICD-10-CM | POA: Diagnosis not present

## 2022-09-07 NOTE — Assessment & Plan Note (Signed)
Euthyroid. Continue Synthroid 88 mcg

## 2022-09-07 NOTE — Patient Instructions (Signed)
Please let me know if vaginal bleeding persists   please call  and schedule your 3D mammogram and /or bone density scan as we discussed.   [facility]  ( new location in 2023)  [address], [place], [place] [address]  [place], [place]  [phone]   I have ordered transvaginal ultrasound.  Let us know if you dont hear back within a week in regards to an appointment being scheduled.   So that you are aware, if you are [facility] MyChart user , please pay attention to your MyChart messages as you may receive a MyChart message with a phone number to call and schedule this test/appointment own your own from our referral coordinator. This is a new process so I do not want you to miss this message.  If you are not a MyChart user, you will receive a phone call.   

## 2022-09-07 NOTE — Progress Notes (Signed)
Assessment & Plan:  Hypothyroidism, unspecified type Assessment & Plan: Euthyroid. Continue Synthroid 88 mcg   Osteoporosis without current pathological fracture, unspecified osteoporosis type Assessment & Plan: Right hip fracture 07/2022. DEXA due 11/03/22. Patient will schedule. Continue boniva.        Return precautions given.   Risks, benefits, and alternatives of the medications and treatment plan prescribed today were discussed, and patient expressed understanding.   Education regarding symptom management and diagnosis given to patient on AVS either electronically or printed.  Return in about 6 months (around 03/08/2023).  Mable Paris, FNP  Subjective:    Patient ID: Elizabeth Fernandez, female    DOB: 06-07-1934, 89 y.o.   MRN: [id number]  CC: Elizabeth Fernandez is a 89 y.o. female who presents today for follow up.   HPI: Feels well today.  No new complaints.      Compliant with Synthroid 88 mcg  Compliant with Boniva.  Reports a fracture right hip after fall off stool over the holidays.  She has seen Dr Nicola Police for this and no intervention required.   She is not in any pain. She is feeling back herself.  She uses her cane, shower chair.   She is walking 3x per week in church parking lot.    Allergies: Pollen extract Current Outpatient Medications on File Prior to Visit  Medication Sig Dispense Refill   albuterol (VENTOLIN HFA) 108 (90 Base) MCG/ACT inhaler Inhale 2 puffs into the lungs every 6 (six) hours as needed for wheezing or shortness of breath. 8 g 0   Ascorbic Acid (VITAMIN C PO) Take 1 tablet by mouth daily.     aspirin EC 81 MG tablet Take 81 mg by mouth daily. Swallow whole.     CALCIUM PO Take 1 tablet by mouth daily.     hydrocortisone 2.5 % lotion Apply topically 2 (two) times daily. Insect bite to face and eyelid itching as needed 59 mL 0   ibandronate (BONIVA) 150 MG tablet Take 1 tablet (150 mg total) by mouth every 30 (thirty) days. Take in the  morning with a full glass of water, on an empty stomach, and do not take anything else by mouth or lie down for the next 30 min. 6 tablet 1   levothyroxine (SYNTHROID) 88 MCG tablet TAKE 1 TABLET BY MOUTH DAILY 90 tablet 2   meloxicam (MOBIC) 7.5 MG tablet Take 1 tablet (7.5 mg total) by mouth daily as needed for pain. 30 tablet 1   Multiple Vitamin (MULTIVITAMIN PO) Take 1 tablet by mouth daily.     mupirocin ointment (BACTROBAN) 2 % Apply 1 Application topically daily. Qd to excision site 22 g 1   nystatin (MYCOSTATIN) 100000 UNIT/ML suspension TAKE 5 MLS (500,000 UNITS) BY MOUTH 4 TIMES DAILY FOR 7 TO 14 DAYS 180 mL 0   No current facility-administered medications on file prior to visit.    Review of Systems  Constitutional:  Negative for chills and fever.  Respiratory:  Negative for cough.   Cardiovascular:  Negative for chest pain and palpitations.  Gastrointestinal:  Negative for nausea and vomiting.      Objective:    BP 130/72   Pulse 82   Temp (!) 97.5 F (36.4 C) (Oral)   Ht '5\' 4"'$  (1.626 m)   Wt 149 lb 6.4 oz (67.8 kg)   SpO2 98%   BMI 25.64 kg/m  BP Readings from Last 3 Encounters:  09/07/22 130/72  03/07/22 110/60  12/20/21  120/60   Wt Readings from Last 3 Encounters:  09/07/22 149 lb 6.4 oz (67.8 kg)  03/07/22 154 lb 3.2 oz (69.9 kg)  12/20/21 156 lb 9.6 oz (71 kg)    Physical Exam Vitals reviewed.  Constitutional:      Appearance: She is well-developed.  Eyes:     Conjunctiva/sclera: Conjunctivae normal.  Cardiovascular:     Rate and Rhythm: Normal rate and regular rhythm.     Pulses: Normal pulses.     Heart sounds: Normal heart sounds.  Pulmonary:     Effort: Pulmonary effort is normal.     Breath sounds: Normal breath sounds. No wheezing, rhonchi or rales.  Skin:    General: Skin is warm and dry.  Neurological:     Mental Status: She is alert.  Psychiatric:        Speech: Speech normal.        Behavior: Behavior normal.        Thought  Content: Thought content normal.

## 2022-09-07 NOTE — Assessment & Plan Note (Signed)
Right hip fracture 07/2022. DEXA due 11/03/22. Patient will schedule. Continue boniva.

## 2022-09-19 DIAGNOSIS — S72114A Nondisplaced fracture of greater trochanter of right femur, initial encounter for closed fracture: Secondary | ICD-10-CM | POA: Diagnosis not present

## 2022-10-12 ENCOUNTER — Telehealth (INDEPENDENT_AMBULATORY_CARE_PROVIDER_SITE_OTHER): Payer: PPO | Admitting: Family

## 2022-10-12 ENCOUNTER — Encounter: Payer: Self-pay | Admitting: Family

## 2022-10-12 DIAGNOSIS — U071 COVID-19: Secondary | ICD-10-CM

## 2022-10-12 MED ORDER — NIRMATRELVIR/RITONAVIR (PAXLOVID)TABLET: 3.0000 | ORAL_TABLET | Freq: Two times a day (BID) | ORAL | 0 refills | Status: AC

## 2022-10-12 NOTE — Assessment & Plan Note (Addendum)
No acute respiratory distress.  She is feeling better today.  Counseled on lacking long term safely and effectiveness data of medication, Paxlovid. Explained EUA for Paxlovid. Criteria met for consideration of Paxlovid,  patient older than 12 years and weight > 40kg, started within 5 days of symptom onset and risk factor for severe disease include: Emphysema, hypothyroidism, age greater than 78 years  Counseled on adverse effects including altered taste, diarrhea, HTN, and myalgia.   As she is feeling better today, she is indecisive about starting Paxlovid, which is understandable.  She has mild symptoms.  I have sent Paxlovid to pharmacy and patient will decide in the next several hours if she plans to start as I explained that she has to start within 5 days of symptom onset.  Advised patient she may start over-the-counter Mucinex D.  Of note, if she starts Paxlovid, did advise her let me know so that we order TSH  as there can be a drug interaction between Paxlovid , Synthroid

## 2022-10-12 NOTE — Progress Notes (Signed)
Virtual Visit via Video Note  I connected with Elizabeth Fernandez on 10/12/22 at  3:30 PM EST by a video enabled telemedicine application and verified that I am speaking with the correct person using two identifiers. Location patient: home Location provider: work  Persons participating in the virtual visit: patient, provider  I discussed the limitations of evaluation and management by telemedicine and the availability of in person appointments. The patient expressed understanding and agreed to proceed.  Interactive audio and video telecommunications were attempted between this provider and patient, however failed, due to patient having technical difficulties or patient did not have access to video capability.  We continued and completed visit with audio only.   HPI: Complains sinus pressure and episodic dull HA . She is feeling better today.   Symptoms started 4 days ago with sneezing.  Drinking a lot of water.   No sob, fever, ear pain, sore throat.    covid positive as of yesterday  GFR 62  H/o emphysema,  hypothyroidism    ROS: See pertinent positives and negatives per HPI.  EXAM:  VITALS per patient if applicable: There were no vitals taken for this visit. BP Readings from Last 3 Encounters:  09/07/22 130/72  03/07/22 110/60  12/20/21 120/60   Wt Readings from Last 3 Encounters:  09/07/22 149 lb 6.4 oz (67.8 kg)  03/07/22 154 lb 3.2 oz (69.9 kg)  12/20/21 156 lb 9.6 oz (71 kg)    ASSESSMENT AND PLAN: COVID-19 Assessment & Plan: No acute respiratory distress.  She is feeling better today.  Counseled on lacking long term safely and effectiveness data of medication, Paxlovid. Explained EUA for Paxlovid. Criteria met for consideration of Paxlovid,  patient older than 12 years and weight > 40kg, started within 5 days of symptom onset and risk factor for severe disease include: Emphysema, hypothyroidism, age greater than 42 years  Counseled on adverse effects including  altered taste, diarrhea, HTN, and myalgia.   As she is feeling better today, she is indecisive about starting Paxlovid, which is understandable.  She has mild symptoms.  I have sent Paxlovid to pharmacy and patient will decide in the next several hours if she plans to start as I explained that she has to start within 5 days of symptom onset.  Advised patient she may start over-the-counter Mucinex D.  Of note, if she starts Paxlovid, did advise her let me know so that we order TSH  as there can be a drug interaction between Paxlovid , Synthroid   Orders: -     nirmatrelvir/ritonavir; Take 3 tablets by mouth 2 (two) times daily for 5 days. (Take nirmatrelvir 150 mg two tablets twice daily for 5 days and ritonavir 100 mg one tablet twice daily for 5 days) Patient GFR is 62  Dispense: 30 tablet; Refill: 0   I have spent 15 minutes with a patient including precharting, exam, reviewing medical records, and discussion plan of care.      -we discussed possible serious and likely etiologies, options for evaluation and workup, limitations of telemedicine visit vs in person visit, treatment, treatment risks and precautions. Pt prefers to treat via telemedicine empirically rather then risking or undertaking an in person visit at this moment.    I discussed the assessment and treatment plan with the patient. The patient was provided an opportunity to ask questions and all were answered. The patient agreed with the plan and demonstrated an understanding of the instructions.   The patient was advised to  call back or seek an in-person evaluation if the symptoms worsen or if the condition fails to improve as anticipated.  Advised if desired AVS can be mailed or viewed via [facility] if [facility] user.   Mable Paris, FNP

## 2022-11-21 ENCOUNTER — Telehealth: Payer: Self-pay | Admitting: Family

## 2022-11-21 NOTE — Telephone Encounter (Signed)
Copied from CRM [id number]. Topic: Medicare AWV >> Nov 21, 2022  2:21 PM Rushie Goltz wrote: Reason for CRM: Called patient to schedule Medicare Annual Wellness Visit (AWV). Left message for patient to call back and schedule Medicare Annual Wellness Visit (AWV).  Last date of AWV: 11/30/2021  [employer] ADV INSURANCE (CALENDAR YR)  Please schedule an AWVS appointment at any time with [facility] VISIT.  If any questions, please contact me at [phone].    Thank you,  [facility] Support [facility] Direct dial  [phone]

## 2022-12-12 ENCOUNTER — Telehealth: Payer: Self-pay | Admitting: Family

## 2022-12-12 NOTE — Telephone Encounter (Signed)
Contacted [name] to schedule their annual wellness visit. Appointment made for 05/202024.  Verlee Rossetti; Care Guide Ambulatory Clinical Support [facility] Direct Dial: [phone]

## 2022-12-24 ENCOUNTER — Ambulatory Visit (INDEPENDENT_AMBULATORY_CARE_PROVIDER_SITE_OTHER): Payer: PPO

## 2022-12-24 DIAGNOSIS — Z0000 Encounter for general adult medical examination without abnormal findings: Secondary | ICD-10-CM

## 2022-12-24 NOTE — Progress Notes (Signed)
I connected with  Elizabeth Fernandez on 12/24/22 by a audio enabled telemedicine application and verified that I am speaking with the correct person using two identifiers.  Patient Location: Home  Provider Location: Home Office  I discussed the limitations of evaluation and management by telemedicine. The patient expressed understanding and agreed to proceed.   Subjective:   Elizabeth Fernandez is a 89 y.o. female who presents for Medicare Annual (Subsequent) preventive examination.  Review of Systems    Per HPI unless specifically indicated below.            Objective:       09/07/2022   10:49 AM 03/07/2022   10:40 AM 03/07/2022   10:07 AM  Vitals with BMI  Height 5\' 4"   5\' 3"   Weight 149 lbs 6 oz  154 lbs 3 oz  BMI 25.63  27.32  Systolic 130 110 604  Diastolic 72 60 80  Pulse 82  78    There were no vitals filed for this visit. There is no height or weight on file to calculate BMI.     12/24/2022   12:00 PM 11/30/2021   11:08 AM 05/13/2020   11:20 AM 05/13/2019   12:18 PM 05/09/2018   12:28 PM 11/27/2017    3:18 PM 11/08/2017    1:39 AM  Advanced Directives  Does Patient Have a Medical Advance Directive? Yes Yes Yes Yes Yes No Yes  Type of Estate agent of [place];Living will Healthcare Power of [place];Living will Healthcare Power of [place];Living will Healthcare Power of [place];Living will Healthcare Power of [place];Living will  Living will  Does patient want to make changes to medical advance directive? No - Patient declined No - Patient declined No - Patient declined No - Patient declined No - Patient declined  No - Patient declined  Copy of Healthcare Power of Attorney in Chart? No - copy requested No - copy requested No - copy requested No - copy requested No - copy requested    Would patient like information on creating a medical advance directive?       No - Patient declined    Current Medications (verified) Outpatient Encounter Medications as  of 12/24/2022  Medication Sig   Ascorbic Acid (VITAMIN C PO) Take 1 tablet by mouth daily.   aspirin EC 81 MG tablet Take 81 mg by mouth daily. Swallow whole.   CALCIUM PO Take 1 tablet by mouth daily.   hydrocortisone 2.5 % lotion Apply topically 2 (two) times daily. Insect bite to face and eyelid itching as needed   ibandronate (BONIVA) 150 MG tablet Take 1 tablet (150 mg total) by mouth every 30 (thirty) days. Take in the morning with a full glass of water, on an empty stomach, and do not take anything else by mouth or lie down for the next 30 min.   levothyroxine (SYNTHROID) 88 MCG tablet TAKE 1 TABLET BY MOUTH DAILY   meloxicam (MOBIC) 7.5 MG tablet Take 1 tablet (7.5 mg total) by mouth daily as needed for pain.   Multiple Vitamin (MULTIVITAMIN PO) Take 1 tablet by mouth daily.   mupirocin ointment (BACTROBAN) 2 % Apply 1 Application topically daily. Qd to excision site   albuterol (VENTOLIN HFA) 108 (90 Base) MCG/ACT inhaler Inhale 2 puffs into the lungs every 6 (six) hours as needed for wheezing or shortness of breath. (Patient not taking: Reported on 12/24/2022)   [DISCONTINUED] nystatin (MYCOSTATIN) 100000 UNIT/ML suspension TAKE 5 MLS (500,000  UNITS) BY MOUTH 4 TIMES DAILY FOR 7 TO 14 DAYS (Patient not taking: Reported on 12/24/2022)   No facility-administered encounter medications on file as of 12/24/2022.    Allergies (verified) Pollen extract   History: Past Medical History:  Diagnosis Date   Arthritis    both knees   BCC (basal cell carcinoma) 04/28/2021   left distal lateral deltoid, excised 02/20/2022   Cataract    Surgery scheduled for 03/2014   DVT (deep venous thrombosis) (HCC)    Left popliteal vein   Past Surgical History:  Procedure Laterality Date   CHOLECYSTECTOMY     FEMUR SURGERY  11/10/2014   HIP ARTHROPLASTY Right 11/09/2017   Procedure: ARTHROPLASTY BIPOLAR HIP (HEMIARTHROPLASTY);  Surgeon: Deeann Saint, MD;  Location: [facility] ORS;  Service: Orthopedics;   Laterality: Right;   IR FLUORO GUIDED NEEDLE PLC ASPIRATION/INJECTION LOC  01/25/2017   Family History  Problem Relation Age of Onset   Cancer Mother    Heart attack Father 79   Osteoporosis Neg Hx    Social History   Socioeconomic History   Marital status: Widowed    Spouse name: Not on file   Number of children: 2   Years of education: Not on file   Highest education level: Not on file  Occupational History   Occupation: Retired  Tobacco Use   Smoking status: Never   Smokeless tobacco: Never  Vaping Use   Vaping Use: Never used  Substance and Sexual Activity   Alcohol use: No    Alcohol/week: 0.0 standard drinks of alcohol   Drug use: No   Sexual activity: Not Currently  Other Topics Concern   Not on file  Social History Narrative   May go back to work after recovery 2016; [employer].    Lives with son    Children- 2 ; one of her sons has drug addiction       Grandchildren- none    Pets: 1 dog inside    Enjoys- Reading and mowing yard    Social Determinants of Health   Financial Resource Strain: Low Risk  (12/24/2022)   Overall Financial Resource Strain (CARDIA)    Difficulty of Paying Living Expenses: Not hard at all  Food Insecurity: No Food Insecurity (12/24/2022)   Hunger Vital Sign    Worried About Running Out of Food in the Last Year: Never true    Ran Out of Food in the Last Year: Never true  Transportation Needs: No Transportation Needs (11/30/2021)   PRAPARE - Administrator, Civil Service (Medical): No    Lack of Transportation (Non-Medical): No  Physical Activity: Sufficiently Active (12/24/2022)   Exercise Vital Sign    Days of Exercise per Week: 3 days    Minutes of Exercise per Session: 60 min  Stress: No Stress Concern Present (12/24/2022)   [employer] of Occupational Health - Occupational Stress Questionnaire    Feeling of Stress : Not at all  Social Connections: Moderately Integrated (12/24/2022)   Social Connection  and Isolation Panel [NHANES]    Frequency of Communication with Friends and Family: Three times a week    Frequency of Social Gatherings with Friends and Family: More than three times a week    Attends Religious Services: More than 4 times per year    Active Member of Clubs or Organizations: Yes    Attends Banker Meetings: More than 4 times per year    Marital Status: Widowed    Tobacco  Counseling Counseling given: No   Clinical Intake:     Pain : No/denies pain     Nutritional Status: BMI of 19-24  Normal Nutritional Risks: None Diabetes: No  How often do you need to have someone help you when you read instructions, pamphlets, or other written materials from your doctor or pharmacy?: 1 - Never  Diabetic?No  Interpreter Needed?: No  Information entered by :: Laurel Dimmer, CMA   Activities of Daily Living    12/24/2022   11:47 AM  In your present state of health, do you have any difficulty performing the following activities:  Hearing? 0  Vision? 1  Difficulty concentrating or making decisions? 0  Walking or climbing stairs? 0  Dressing or bathing? 0  Doing errands, shopping? 0    Patient Care Team: Allegra Grana, FNP as PCP - General (Family Medicine)  Indicate any recent Medical Services you may have received from other than [facility] providers in the past year (date may be approximate).     Assessment:   This is a routine wellness examination for Cather.  Hearing/Vision screen Denies any hearing issues. Denies any change to her vision. Wear glasses. Annual Eye Exam.  Dietary issues and exercise activities discussed: Current Exercise Habits: Home exercise routine, Exercise limited by: orthopedic condition(s) (Yard work, house work)   Goals Addressed   None    Depression Screen    12/24/2022   11:44 AM 10/12/2022    3:20 PM 09/07/2022   10:50 AM 03/07/2022   10:08 AM 01/18/2022    2:29 PM 11/30/2021   11:07 AM 09/06/2021   10:42 AM  PHQ  2/9 Scores  PHQ - 2 Score 0 0 0 0 0 0 0    Fall Risk    12/24/2022   11:44 AM 10/12/2022    3:20 PM 09/07/2022   10:49 AM 03/07/2022   10:08 AM 01/18/2022    2:29 PM  Fall Risk   Falls in the past year? 1 0 1 0 0  Number falls in past yr: 0 0 0 0 0  Injury with Fall? 0 0 1 0 0  Risk for fall due to : Other (Comment) No Fall Risks History of fall(s) No Fall Risks No Fall Risks  Risk for fall due to: Comment Fell of stool 12/23,  tripped over tree stump      Follow up Falls evaluation completed Falls evaluation completed Falls evaluation completed Falls evaluation completed Falls evaluation completed    FALL RISK PREVENTION PERTAINING TO THE HOME:  Any stairs in or around the home? Yes  If so, are there any without handrails? No  Home free of loose throw rugs in walkways, pet beds, electrical cords, etc? No  Adequate lighting in your home to reduce risk of falls? Yes   ASSISTIVE DEVICES UTILIZED TO PREVENT FALLS:  Life alert? No  Use of a cane, walker or w/c? Yes  Grab bars in the bathroom? No  Shower chair or bench in shower? Yes  Elevated toilet seat or a handicapped toilet? No   TIMED UP AND GO:  Was the test performed? Unable to perform, virtual appointment   Cognitive Function:    10/24/2016    2:22 PM 10/24/2015    1:38 PM  MMSE - Mini Mental State Exam  Orientation to time 5 5  Orientation to Place 5 5  Registration 3 3  Attention/ Calculation 5 5  Recall 3 3  Language- name 2 objects 2 2  Language- repeat 1 1  Language- follow 3 step command 3 3  Language- read & follow direction 1 1  Write a sentence 1 1  Copy design 1 1  Total score 30 30        12/24/2022   11:51 AM 11/30/2021   11:20 AM 05/13/2020   11:28 AM 05/13/2019   12:21 PM 05/09/2018   12:32 PM  6CIT Screen  What Year? 0 points 0 points 0 points 0 points 0 points  What month? 0 points 0 points 0 points 0 points 0 points  What time? 0 points 0 points 0 points 0 points 0 points  Count back from 20  4 points 0 points  0 points 0 points  Months in reverse 0 points 0 points 0 points 0 points 0 points  Repeat phrase 0 points 0 points  0 points 0 points  Total Score 4 points 0 points  0 points 0 points    Immunizations Immunization History  Administered Date(s) Administered   Fluad Quad(high Dose 65+) 04/30/2019, 05/07/2022   Influenza, High Dose Seasonal PF 06/19/2016, 05/09/2018, 05/23/2021   Influenza,inj,Quad PF,6+ Mos 07/26/2015   Influenza-Unspecified 06/06/2020   Moderna SARS-COV2 Booster Vaccination 07/24/2020   PFIZER(Purple Top)SARS-COV-2 Vaccination 08/21/2019, 09/11/2019   PPD Test 11/18/2014   Pneumococcal Conjugate-13 10/24/2016   Pneumococcal Polysaccharide-23 04/30/2019    TDAP status: Due, Education has been provided regarding the importance of this vaccine. Advised may receive this vaccine at local pharmacy or Health Dept. Aware to provide a copy of the vaccination record if obtained from local pharmacy or Health Dept. Verbalized acceptance and understanding.  Flu Vaccine status: Up to date  Pneumococcal vaccine status: Due, Education has been provided regarding the importance of this vaccine. Advised may receive this vaccine at local pharmacy or Health Dept. Aware to provide a copy of the vaccination record if obtained from local pharmacy or Health Dept. Verbalized acceptance and understanding.  Covid-19 vaccine status: Information provided on how to obtain vaccines.   Qualifies for Shingles Vaccine? Yes   Zostavax completed No   Shingrix Completed?: No.    Education has been provided regarding the importance of this vaccine. Patient has been advised to call insurance company to determine out of pocket expense if they have not yet received this vaccine. Advised may also receive vaccine at local pharmacy or Health Dept. Verbalized acceptance and understanding.  Screening Tests Health Maintenance  Topic Date Due   DTaP/Tdap/Td (1 - Tdap) Never done   Zoster  Vaccines- Shingrix (1 of 2) Never done   COVID-19 Vaccine (3 - Pfizer risk series) 08/21/2020   INFLUENZA VACCINE  03/07/2023   Medicare Annual Wellness (AWV)  12/24/2023   Pneumonia Vaccine 33+ Years old  Completed   DEXA SCAN  Completed   HPV VACCINES  Aged Out    Health Maintenance  Health Maintenance Due  Topic Date Due   DTaP/Tdap/Td (1 - Tdap) Never done   Zoster Vaccines- Shingrix (1 of 2) Never done   COVID-19 Vaccine (3 - Pfizer risk series) 08/21/2020    Colorectal cancer screening: No longer required.   Mammogram status: No longer required due to age.  DEXA Scan: 11/02/2020  Lung Cancer Screening: (Low Dose CT Chest recommended if Age 79-80 years, 30 pack-year currently smoking OR have quit w/in 15years.) does not qualify.   Lung Cancer Screening Referral: not applicable  Additional Screening:  Hepatitis C Screening: does not qualify  Vision Screening: Recommended annual ophthalmology exams for early  detection of glaucoma and other disorders of the eye. Is the patient up to date with their annual eye exam?  Yes  Who is the provider or what is the name of the office in which the patient attends annual eye exams?  If pt is not established with a provider, would they like to be referred to a provider to establish care? No .   Dental Screening: Recommended annual dental exams for proper oral hygiene  Community Resource Referral / Chronic Care Management: CRR required this visit?  No   CCM required this visit?  No      Plan:     I have personally reviewed and noted the following in the patient's chart:   Medical and social history Use of alcohol, tobacco or illicit drugs  Current medications and supplements including opioid prescriptions. Patient is not currently taking opioid prescriptions. Functional ability and status Nutritional status Physical activity Advanced directives List of other physicians Hospitalizations, surgeries, and ER visits in  previous 12 months Vitals Screenings to include cognitive, depression, and falls Referrals and appointments  In addition, I have reviewed and discussed with patient certain preventive protocols, quality metrics, and best practice recommendations. A written personalized care plan for preventive services as well as general preventive health recommendations were provided to patient.     Ms. Dush , Thank you for taking time to come for your Medicare Wellness Visit. I appreciate your ongoing commitment to your health goals. Please review the following plan we discussed and let me know if I can assist you in the future.   These are the goals we discussed:  Goals      Maintain Healthy Lifestyle     Healthy diet. Stay active. Stay hydrated.        This is a list of the screening recommended for you and due dates:  Health Maintenance  Topic Date Due   DTaP/Tdap/Td vaccine (1 - Tdap) Never done   Zoster (Shingles) Vaccine (1 of 2) Never done   COVID-19 Vaccine (3 - Pfizer risk series) 08/21/2020   Flu Shot  03/07/2023   Medicare Annual Wellness Visit  12/24/2023   Pneumonia Vaccine  Completed   DEXA scan (bone density measurement)  Completed   HPV Vaccine  Aged [address] Nelia Shi, [place]   12/24/2022   Nurse Notes: Approximately 30 minute Non-Face -To-Face Medicare Wellness Visit

## 2022-12-24 NOTE — Patient Instructions (Signed)

## 2023-01-04 ENCOUNTER — Telehealth (INDEPENDENT_AMBULATORY_CARE_PROVIDER_SITE_OTHER): Payer: PPO | Admitting: Nurse Practitioner

## 2023-01-04 DIAGNOSIS — J208 Acute bronchitis due to other specified organisms: Secondary | ICD-10-CM | POA: Diagnosis not present

## 2023-01-04 DIAGNOSIS — B9689 Other specified bacterial agents as the cause of diseases classified elsewhere: Secondary | ICD-10-CM

## 2023-01-04 MED ORDER — BENZONATATE 100 MG PO CAPS: 100.0000 mg | ORAL_CAPSULE | Freq: Two times a day (BID) | ORAL | 0 refills | Status: DC | PRN

## 2023-01-04 MED ORDER — AZITHROMYCIN 250 MG PO TABS: ORAL_TABLET | ORAL | 0 refills | Status: AC

## 2023-01-04 NOTE — Progress Notes (Signed)
   Established Patient Office Visit  An audio/visual tele-health visit was completed today for this patient. I connected with  [name] on 01/04/23 utilizing audio/visual technology and verified that I am speaking with the correct person using two identifiers. The patient was located at their home, and I was located at the office of [facility] during the encounter. I discussed the limitations of evaluation and management by telemedicine. The patient expressed understanding and agreed to proceed.    Subjective   Patient ID: LAJADA MUNIR, female    DOB: October 03, 1933  Age: 89 y.o. MRN: [id number]  Chief Complaint  Patient presents with   Cough    Symptom onset 3-day ago, experiencing cough, congestion, runny nose.  Cough is nonproductive, no shortness of breath or wheezing.  Has not tested herself for COVID at home but reports recent COVID infection about 2 months ago.  Overall feeling malaise and concerned that this can get progressively worse and would like to discuss possible treatment with antibiotic.  Denies any personal history of C. difficile.    Review of Systems  Constitutional:  Negative for chills and fever.  HENT:  Positive for congestion.   Respiratory:  Positive for cough. Negative for sputum production, shortness of breath and wheezing.   Cardiovascular:  Negative for chest pain.  Gastrointestinal:  Negative for blood in stool.  Neurological:  Negative for headaches.      Objective:     There were no vitals taken for this visit.   Physical Exam Comprehensive physical exam not completed today as office visit was conducted remotely.  No signs of acute respiratory distress noted on video.  Patient was alert and oriented, and appeared to have appropriate judgment.   No results found for any visits on 01/04/23.    The ASCVD Risk score (Arnett DK, et al., 2019) failed to calculate for the following reasons:   The 2019 ASCVD risk score is  only valid for ages 59 to 57    Assessment & Plan:   Problem List Items Addressed This Visit       Respiratory   Acute bacterial bronchitis - Primary    Acute, worsening We did discuss that majority of the time the symptoms are caused by viral infections and that antibiotic may not be beneficial. However due to patient's age she is at high risk for more severe illness if not treated appropriately.  She would like to trial antibiotic and I believe this is reasonable, azithromycin sent to patient's pharmacy as well as Jerilynn Som for cough suppression. Patient was told to call her PCPs office next week if symptoms persist for in person evaluation and to go to the emergency department if symptoms worsen.  She reports understanding.      Relevant Medications   azithromycin (ZITHROMAX) 250 MG tablet   benzonatate (TESSALON) 100 MG capsule    Return if symptoms worsen or fail to improve.    Elenore Paddy, NP

## 2023-01-04 NOTE — Assessment & Plan Note (Signed)
Acute, worsening We did discuss that majority of the time the symptoms are caused by viral infections and that antibiotic may not be beneficial. However due to patient's age she is at high risk for more severe illness if not treated appropriately.  She would like to trial antibiotic and I believe this is reasonable, azithromycin sent to patient's pharmacy as well as Jerilynn Som for cough suppression. Patient was told to call her PCPs office next week if symptoms persist for in person evaluation and to go to the emergency department if symptoms worsen.  She reports understanding.

## 2023-01-13 ENCOUNTER — Encounter: Payer: Self-pay | Admitting: Emergency Medicine

## 2023-01-13 ENCOUNTER — Ambulatory Visit
Admission: EM | Admit: 2023-01-13 | Discharge: 2023-01-13 | Disposition: A | Discharge status: Home / Self Care | Payer: PPO | Attending: Urgent Care | Admitting: Urgent Care

## 2023-01-13 DIAGNOSIS — R21 Rash and other nonspecific skin eruption: Secondary | ICD-10-CM | POA: Diagnosis not present

## 2023-01-13 MED ORDER — TRIAMCINOLONE ACETONIDE 0.1 % EX CREA: 1.0000 | TOPICAL_CREAM | Freq: Two times a day (BID) | CUTANEOUS | 0 refills | Status: DC

## 2023-01-13 NOTE — ED Provider Notes (Signed)
[facility]    CSN: [id number] Arrival date & time: 01/13/23  1245      History   Chief Complaint Chief Complaint  Patient presents with   Rash    HPI Elizabeth Fernandez is a 89 y.o. female.    Rash   Patient presents to urgent care with complaint of painful rash on the right arm and right side of her face.  She thinks she has shingles.    Past Medical History:  Diagnosis Date   Arthritis    both knees   BCC (basal cell carcinoma) 04/28/2021   left distal lateral deltoid, excised 02/20/2022   Cataract    Surgery scheduled for 03/2014   DVT (deep venous thrombosis) (HCC)    Left popliteal vein    Patient Active Problem List   Diagnosis Date Noted   Acute bacterial bronchitis 01/04/2023   COVID-19 10/12/2022   Thrush 12/20/2021   Acute cough 12/11/2021   Irregular heartbeat 09/06/2021   Left knee pain 09/06/2021   PAC (premature atrial contraction) 09/06/2021   DVT (deep venous thrombosis) (HCC) 03/03/2021   Osteoporosis 03/02/2021   Hypothyroidism 10/19/2020   S/P shoulder hemiarthroplasty, right 11/29/2017   Emphysema lung (HCC) 11/13/2017   Bilateral hip pain 11/08/2017   Chronic deep vein thrombosis (DVT) of calf muscle vein of left lower extremity (HCC) 11/08/2017   Fatigue 02/25/2017   Acute deep vein thrombosis (DVT) of axillary vein of left upper extremity (HCC) 02/25/2017   Left leg swelling 02/18/2017   Back pain 01/21/2017   Sacral fracture (HCC) 01/21/2017   Acute left-sided low back pain without sciatica 01/15/2017   Leg lesion 11/19/2016   Postmenopausal estrogen deficiency 11/19/2016   Cataract 10/06/2015   Situational anxiety 08/02/2015   Hemorrhagic eye 12/18/2014   Encounter to establish care 12/09/2014   History of fracture of leg 12/09/2014    Past Surgical History:  Procedure Laterality Date   CHOLECYSTECTOMY     FEMUR SURGERY  11/10/2014   HIP ARTHROPLASTY Right 11/09/2017   Procedure: ARTHROPLASTY BIPOLAR HIP  (HEMIARTHROPLASTY);  Surgeon: Deeann Saint, MD;  Location: [facility] ORS;  Service: Orthopedics;  Laterality: Right;   IR FLUORO GUIDED NEEDLE PLC ASPIRATION/INJECTION LOC  01/25/2017    OB History   No obstetric history on file.      Home Medications    Prior to Admission medications   Medication Sig Start Date End Date Taking? Authorizing Provider  albuterol (VENTOLIN HFA) 108 (90 Base) MCG/ACT inhaler Inhale 2 puffs into the lungs every 6 (six) hours as needed for wheezing or shortness of breath. Patient not taking: Reported on 12/24/2022 07/13/22   Allegra Grana, FNP  Ascorbic Acid (VITAMIN C PO) Take 1 tablet by mouth daily.    [provider]  aspirin EC 81 MG tablet Take 81 mg by mouth daily. Swallow whole.    [provider]  benzonatate (TESSALON) 100 MG capsule Take 1 capsule (100 mg total) by mouth 2 (two) times daily as needed for cough. 01/04/23   Elenore Paddy, NP  CALCIUM PO Take 1 tablet by mouth daily.    [provider]  hydrocortisone 2.5 % lotion Apply topically 2 (two) times daily. Insect bite to face and eyelid itching as needed 10/19/20   McLean-Scocuzza, Pasty Spillers, MD  ibandronate (BONIVA) 150 MG tablet Take 1 tablet (150 mg total) by mouth every 30 (thirty) days. Take in the morning with a full glass of water, on an empty stomach,  and do not take anything else by mouth or lie down for the next 30 min. 03/07/22   Allegra Grana, FNP  levothyroxine (SYNTHROID) 88 MCG tablet TAKE 1 TABLET BY MOUTH DAILY 05/10/22   Allegra Grana, FNP  meloxicam (MOBIC) 7.5 MG tablet Take 1 tablet (7.5 mg total) by mouth daily as needed for pain. 09/06/21   Allegra Grana, FNP  Multiple Vitamin (MULTIVITAMIN PO) Take 1 tablet by mouth daily.    [provider]  mupirocin ointment (BACTROBAN) 2 % Apply 1 Application topically daily. Qd to excision site 02/20/22   Deirdre Evener, MD    Family History Family History  Problem Relation Age of Onset    Cancer Mother    Heart attack Father 47   Osteoporosis Neg Hx     Social History Social History   Tobacco Use   Smoking status: Never   Smokeless tobacco: Never  Vaping Use   Vaping Use: Never used  Substance Use Topics   Alcohol use: No    Alcohol/week: 0.0 standard drinks of alcohol   Drug use: No     Allergies   Pollen extract   Review of Systems Review of Systems  Skin:  Positive for rash.     Physical Exam Triage Vital Signs ED Triage Vitals  Enc Vitals Group     BP 01/13/23 1331 (!) 167/80     Pulse Rate 01/13/23 1331 81     Resp 01/13/23 1331 16     Temp 01/13/23 1331 98 F (36.7 C)     Temp Source 01/13/23 1331 Oral     SpO2 01/13/23 1331 98 %     Weight --      Height --      Head Circumference --      Peak Flow --      Pain Score 01/13/23 1330 9     Pain Loc --      Pain Edu? --      Excl. in GC? --    No data found.  Updated Vital Signs BP (!) 167/80 (BP Location: Left Arm)   Pulse 81   Temp 98 F (36.7 C) (Oral)   Resp 16   SpO2 98%   Visual Acuity Right Eye Distance:   Left Eye Distance:   Bilateral Distance:    Right Eye Near:   Left Eye Near:    Bilateral Near:     Physical Exam HENT:     Head:   Chest:    Musculoskeletal:       Arms:  Skin:    Findings: Erythema and rash present.  Neurological:     General: No focal deficit present.     Mental Status: She is oriented to person, place, and time.  Psychiatric:        Mood and Affect: Mood normal.        Behavior: Behavior normal.      UC Treatments / Results  Labs (all labs ordered are listed, but only abnormal results are displayed) Labs Reviewed - No data to display  EKG   Radiology No results found.  Procedures Procedures (including critical care time)  Medications Ordered in UC Medications - No data to display  Initial Impression / Assessment and Plan / UC Course  I have reviewed the triage vital signs and the nursing notes.  Pertinent  labs & imaging results that were available during my care of the patient were reviewed by me and  considered in my medical decision making (see chart for details).   Elizabeth Fernandez is a 89 y.o. female presenting with rash. Patient is afebrile without recent antipyretics, satting well on room air. Overall is well appearing though non-toxic, well hydrated, without respiratory distress.  Erythematous rash is present on her right cheek.  There are some nodules within the irritated skin.  Erythematous rash on her right axilla going down the right arm and right flank.  No vesicles, no ulcerations, no discharge.  Reviewed relevant chart history.   Rash is affecting multiple dermatomes, especially at the right axilla and zoster is unlikely.  Possibly poison ivy dermatitis given the patient's frequent contact with the outdoors.  Given her age, will treat conservatively using topical corticosteroid creams.  Recommending not using longer than 7 days.  Counseled patient on potential for adverse effects with medications prescribed/recommended today, ER and return-to-clinic precautions discussed, patient verbalized understanding and agreement with care plan.  Final Clinical Impressions(s) / UC Diagnoses   Final diagnoses:  None   Discharge Instructions   None    ED Prescriptions   None    PDMP not reviewed this encounter.   Charma Igo, [place] 01/13/23 1354

## 2023-01-13 NOTE — Discharge Instructions (Signed)
Follow up here or with your primary care provider if your symptoms are worsening or not improving.     

## 2023-01-13 NOTE — ED Triage Notes (Signed)
Pt presents with a painful rash on her right arm and right side of face x 3 days

## 2023-01-16 ENCOUNTER — Encounter: Payer: Self-pay | Admitting: Family

## 2023-01-16 ENCOUNTER — Ambulatory Visit (INDEPENDENT_AMBULATORY_CARE_PROVIDER_SITE_OTHER): Payer: PPO | Admitting: Family

## 2023-01-16 VITALS — BP 128/80 | HR 83 | Temp 98.2°F | Ht 61.0 in | Wt 147.0 lb

## 2023-01-16 DIAGNOSIS — L249 Irritant contact dermatitis, unspecified cause: Secondary | ICD-10-CM | POA: Diagnosis not present

## 2023-01-16 DIAGNOSIS — L259 Unspecified contact dermatitis, unspecified cause: Secondary | ICD-10-CM | POA: Insufficient documentation

## 2023-01-16 MED ORDER — CLOTRIMAZOLE 1 % EX CREA: 1.0000 | TOPICAL_CREAM | Freq: Two times a day (BID) | CUTANEOUS | 1 refills | Status: DC

## 2023-01-16 MED ORDER — PREDNISONE 10 MG PO TABS: ORAL_TABLET | ORAL | 0 refills | Status: DC

## 2023-01-16 NOTE — Progress Notes (Signed)
Assessment & Plan:  Irritant contact dermatitis, unspecified trigger Assessment & Plan: Fortunately no systemic features.  Presentation consistent with contact dermatitis.  Rash is no longer spreading.  Patient has done well on prednisone in the past.  Start prednisone taper.  Low suspicion for intertrigo candidiasis however I did provide patient with prescription for clotrimazole ointment to use under right arm.   She will let me know how she is doing.  Plan to consult dermatology if rash does not resolve  Orders: -     predniSONE; Take 4 tablets ( total 40 mg) by mouth for 2 days; take 3 tablets ( total 30 mg) by mouth for 2 days; take 2 tablets ( total 20 mg) by mouth for 1 day; take 1 tablet ( total 10 mg) by mouth for 1 day.  Dispense: 17 tablet; Refill: 0 -     Clotrimazole; Apply 1 Application topically 2 (two) times daily.  Dispense: 30 g; Refill: 1     Return precautions given.   Risks, benefits, and alternatives of the medications and treatment plan prescribed today were discussed, and patient expressed understanding.   Education regarding symptom management and diagnosis given to patient on AVS either electronically or printed.  No follow-ups on file.  Rennie Plowman, FNP  Subjective:    Patient ID: Elizabeth Fernandez, female    DOB: October 12, 1933, 89 y.o.   MRN: [id number]  CC: Elizabeth Fernandez is a 89 y.o. female who presents today for follow up.   HPI: Complains of pruritic rash right flank x one week, improved slightly.    Spread to right under arm and then to right cheek.   She now has rash on left upper arm.  She has seen one lesion on left dorsal hand blister with clear fluid.   Noticed after working in the yard.  She has had poison ivy in the past and this rash does remind her of poison ivy.   No fever, chills.   She has been on prednisone in the past without side effects   Seen in ED for rash 01/13/23 Given triamcinolone 0.%1 with some improvement.  Redness of rash  has lightened under her arm.  She doesn't think is actively spreading.   No rash below the waist, eyes or ears.   H/o BCC; follows with Dr Gwen Pounds  She would like raised ceramic toilet seat prescription.   Allergies: Pollen extract Current Outpatient Medications on File Prior to Visit  Medication Sig Dispense Refill   Ascorbic Acid (VITAMIN C PO) Take 1 tablet by mouth daily.     aspirin EC 81 MG tablet Take 81 mg by mouth daily. Swallow whole.     CALCIUM PO Take 1 tablet by mouth daily.     hydrocortisone 2.5 % lotion Apply topically 2 (two) times daily. Insect bite to face and eyelid itching as needed 59 mL 0   ibandronate (BONIVA) 150 MG tablet Take 1 tablet (150 mg total) by mouth every 30 (thirty) days. Take in the morning with a full glass of water, on an empty stomach, and do not take anything else by mouth or lie down for the next 30 min. 6 tablet 1   levothyroxine (SYNTHROID) 88 MCG tablet TAKE 1 TABLET BY MOUTH DAILY 90 tablet 2   meloxicam (MOBIC) 7.5 MG tablet Take 1 tablet (7.5 mg total) by mouth daily as needed for pain. 30 tablet 1   Multiple Vitamin (MULTIVITAMIN PO) Take 1 tablet by mouth daily.  triamcinolone cream (KENALOG) 0.1 % Apply 1 Application topically 2 (two) times daily. Do not use longer than 7 days. 30 g 0   No current facility-administered medications on file prior to visit.    Review of Systems  Constitutional:  Negative for chills and fever.  Respiratory:  Negative for cough.   Cardiovascular:  Negative for chest pain and palpitations.  Gastrointestinal:  Negative for nausea and vomiting.  Skin:  Positive for rash.      Objective:    BP 128/80   Pulse 83   Temp 98.2 F (36.8 C) (Oral)   Ht 5\' 1"  (1.549 m)   Wt 147 lb (66.7 kg)   SpO2 96%   BMI 27.78 kg/m  BP Readings from Last 3 Encounters:  01/16/23 128/80  01/13/23 (!) 167/80  09/07/22 130/72   Wt Readings from Last 3 Encounters:  01/16/23 147 lb (66.7 kg)  09/07/22 149 lb 6.4  oz (67.8 kg)  03/07/22 154 lb 3.2 oz (69.9 kg)        Physical Exam Vitals reviewed.  Constitutional:      Appearance: She is well-developed.  Eyes:     Conjunctiva/sclera: Conjunctivae normal.  Cardiovascular:     Rate and Rhythm: Normal rate and regular rhythm.     Pulses: Normal pulses.     Heart sounds: Normal heart sounds.  Pulmonary:     Effort: Pulmonary effort is normal.     Breath sounds: Normal breath sounds. No wheezing, rhonchi or rales.  Skin:    General: Skin is warm and dry.     Comments: Well-demarcated intensely erythematous confluent area under right arm and axilla.  Small vesicular grouped lesions noted.  No purulent discharge.  No red streaks or increased warmth. Isolated grouped vesicular lesions left wrist without red streaks, purulent discharge.   Neurological:     Mental Status: She is alert.  Psychiatric:        Speech: Speech normal.        Behavior: Behavior normal.        Thought Content: Thought content normal.

## 2023-01-16 NOTE — Assessment & Plan Note (Signed)
Fortunately no systemic features.  Presentation consistent with contact dermatitis.  Rash is no longer spreading.  Patient has done well on prednisone in the past.  Start prednisone taper.  Low suspicion for intertrigo candidiasis however I did provide patient with prescription for clotrimazole ointment to use under right arm.   She will let me know how she is doing.  Plan to consult dermatology if rash does not resolve

## 2023-01-16 NOTE — Patient Instructions (Signed)
I really think the appearance is consistent with poison ivy dermatitis.  I have sent in a prednisone taper for you to start.  As discussed, prednisone can interfere with sleep so please ensure you are getting all tablets and by approximately noon each day.  I have also provided you with clotrimazole ointment to use specifically under your right upper arm if rash is not improved.    I have little suspicion that it is related to yeast however the fiery red component under your right arm may have a yeast component. Do not start clotrimazole ointment until after you have taken prednisone for a few days and establish whether prednisone was helping.  Please let me know how you are doing and call the office Friday morning to give me an update.   Contact Dermatitis Dermatitis is redness, soreness, and swelling (inflammation) of the skin. Contact dermatitis is a reaction to certain substances that touch the skin. There are two types of this condition: Irritant contact dermatitis. This is the most common type. It happens when something irritates your skin, such as when your hands get dry from washing them too often with soap. You can get this type of reaction even if you have not been exposed to the irritant before. Allergic contact dermatitis. This type is caused by a substance that you are allergic to, such as poison ivy. It occurs when you have been exposed to the substance (allergen) and form a sensitivity to it. In some cases, the reaction may start soon after your first exposure to the allergen. In other cases, it may not start until you are exposed to the allergen again. It may then occur every time you are exposed to the allergen in the future. What are the causes? Irritant contact dermatitis is often caused by exposure to: Makeup. Soaps, detergents, and bleaches. Acids. Metal salts, such as nickel. Allergic contact dermatitis is often caused by exposure to: Poisonous  plants. Chemicals. Jewelry. Latex. Medicines. Preservatives in products, such as clothes. What increases the risk? You are more likely to get this condition if you have: A job that exposes you to irritants or allergens. Certain medical conditions. These include asthma and eczema. What are the signs or symptoms? Symptoms of this condition may occur in any place on your body that has been touched by the irritant. Symptoms include: Dryness, flaking, or cracking. Redness. Itching. Pain or a burning feeling. Blisters. Drainage of small amounts of blood or clear fluid from skin cracks. With allergic contact dermatitis, there may also be swelling in areas such as the eyelids, mouth, or genitals. How is this diagnosed? This condition is diagnosed with a medical history and physical exam. A patch skin test may be done to help figure out the cause. If the condition is related to your job, you may need to see an expert in health problems in the workplace (occupational medicine specialist). How is this treated? This condition is treated by staying away from the cause of the reaction and protecting your skin from further contact. Treatment may also include: Steroid creams or ointments. Steroid medicines may need be taken by mouth (orally) in more severe cases. Antibiotics or medicines applied to the skin to kill bacteria (antibacterial ointments). These may be needed if a skin infection is present. Antihistamines. These may be taken orally or put on as a lotion to ease itching. A bandage (dressing). Follow these instructions at home: Skin care Moisturize your skin as needed. Put cool, wet cloths (cool compresses) on  the affected areas. Try applying baking soda paste to your skin. Stir water into baking soda until it has the consistency of a paste. Do not scratch your skin. Avoid friction to the affected area. Avoid the use of soaps, perfumes, and dyes. Check the affected areas every day for  signs of infection. Check for: More redness, swelling, or pain. More fluid or blood. Warmth. Pus or a bad smell. Medicines Take or apply over-the-counter and prescription medicines only as told by your health care provider. If you were prescribed antibiotics, take or apply them as told by your health care provider. Do not stop using the antibiotic even if you start to feel better. Bathing Try taking a bath with: Epsom salts. Follow the instructions on the packaging. You can get these at your local pharmacy or grocery store. Baking soda. Pour a small amount into the bath as told by your health care provider. Colloidal oatmeal. Follow the instructions on the packaging. You can get this at your local pharmacy or grocery store. Bathe less often. This may mean bathing every other day. Bathe in lukewarm water. Avoid using hot water. Bandage care If you were given a dressing, change it as told by your health care provider. Wash your hands with soap and water for at least 20 seconds before and after you change your dressing. If soap and water are not available, use hand sanitizer. General instructions Avoid the substance that caused your reaction. If you do not know what caused it, keep a journal to try to track what caused it. Write down: What you eat and drink. What cosmetics you use. What you wear in the affected area. This includes jewelry. Contact a health care provider if: Your condition does not get better with treatment. Your condition gets worse. You have any signs of infection. You have a fever. You have new symptoms. Your bone or joint under the affected area becomes painful after the skin has healed. Get help right away if: You notice red streaks coming from the affected area. The affected area turns darker. You have trouble breathing. This information is not intended to replace advice given to you by your health care provider. Make sure you discuss any questions you have with  your health care provider. Document Revised: 01/26/2022 Document Reviewed: 01/26/2022 Elsevier Patient Education  2024 [employer].

## 2023-03-08 ENCOUNTER — Ambulatory Visit: Payer: PPO | Admitting: Family

## 2023-05-09 ENCOUNTER — Other Ambulatory Visit: Payer: Self-pay | Admitting: Family

## 2023-05-09 DIAGNOSIS — E039 Hypothyroidism, unspecified: Secondary | ICD-10-CM

## 2023-06-17 ENCOUNTER — Telehealth: Payer: Self-pay | Admitting: Family

## 2023-06-17 NOTE — Telephone Encounter (Signed)
Patient just called and said she has a lingering cough. She wants to know if she can get some medication for it. The pharmacy she use is [facility] - [place], [place] - [address] [place], [place] [address] Phone: [phone]  Fax: [phone]  Her number is [phone].

## 2023-06-17 NOTE — Telephone Encounter (Signed)
Spoke with pt and scheduled appt to see Claris Che on 06/20/23

## 2023-06-20 ENCOUNTER — Telehealth (INDEPENDENT_AMBULATORY_CARE_PROVIDER_SITE_OTHER): Payer: PPO | Admitting: Family

## 2023-06-20 ENCOUNTER — Encounter: Payer: Self-pay | Admitting: Family

## 2023-06-20 VITALS — Ht 61.0 in | Wt 147.1 lb

## 2023-06-20 DIAGNOSIS — J40 Bronchitis, not specified as acute or chronic: Secondary | ICD-10-CM

## 2023-06-20 MED ORDER — AMOXICILLIN-POT CLAVULANATE 875-125 MG PO TABS: 1.0000 | ORAL_TABLET | Freq: Two times a day (BID) | ORAL | 0 refills | Status: AC

## 2023-06-20 NOTE — Progress Notes (Signed)
Verbal consent for services obtained from patient prior to services given to TELEPHONE visit:   Location of call:  provider at work patient at home  Names of all persons present for services: Rennie Plowman, NP and patient Complains of productive cough x 2 weeks, unchanged.  She has coughing spells   Symptoms started with runny nose  Endorses nasal congestion with thick sputum  Denies F, chills, sinus pain, ear pain, sob, leg swelling   Covid test negative last week    Never smoker  She is using tessalon perles without relief.   Mucinex DM with some relief.     A/P/next steps:  Bronchitis Assessment & Plan: No acute respiratory distress.  Duration 2 weeks.  Discussed concern for bacterial URI.  Start Augmentin, probiotics.  Advised continued use of Mucinex DM.  She will let me know if symptoms do not completely resolve  Orders: -     Amoxicillin-Pot Clavulanate; Take 1 tablet by mouth 2 (two) times daily for 7 days. PLEASE DELIVER  Dispense: 14 tablet; Refill: 0     Advised patient AVS could be mailed or viewed over MyChart if MyChart user.   I spent 15 min  discussing plan of care over the phone.

## 2023-06-20 NOTE — Patient Instructions (Addendum)
Start Augmentin for concern for bacterial upper respiratory infection  Ensure to take probiotics while on antibiotics and also for 2 weeks after completion. This can either be by eating yogurt daily or taking a probiotic supplement over the counter such as Culturelle.It is important to re-colonize the gut with good bacteria and also to prevent any diarrheal infections associated with antibiotic use.    Please continue Mucinex DM.  Please let me know how you are doing

## 2023-06-21 NOTE — Assessment & Plan Note (Signed)
No acute respiratory distress.  Duration 2 weeks.  Discussed concern for bacterial URI.  Start Augmentin, probiotics.  Advised continued use of Mucinex DM.  She will let me know if symptoms do not completely resolve

## 2023-12-05 HISTORY — PX: OTHER SURGICAL HISTORY: SHX169

## 2023-12-16 ENCOUNTER — Encounter: Payer: Self-pay | Admitting: Dermatology

## 2023-12-16 ENCOUNTER — Ambulatory Visit: Admitting: Dermatology

## 2023-12-16 DIAGNOSIS — D485 Neoplasm of uncertain behavior of skin: Secondary | ICD-10-CM | POA: Diagnosis not present

## 2023-12-16 DIAGNOSIS — C44622 Squamous cell carcinoma of skin of right upper limb, including shoulder: Secondary | ICD-10-CM

## 2023-12-16 DIAGNOSIS — C4492 Squamous cell carcinoma of skin, unspecified: Secondary | ICD-10-CM

## 2023-12-16 HISTORY — DX: Squamous cell carcinoma of skin, unspecified: C44.92

## 2023-12-16 NOTE — Patient Instructions (Addendum)

## 2023-12-16 NOTE — Progress Notes (Signed)
   Follow-Up Visit   Subjective  Elizabeth Fernandez is a 89 y.o. female who presents for the following: Irregular skin lesion of the R hand dorsum x 2 weeks, very tender, pt concerned and would like lesion checked today.   The patient has spots, moles and lesions to be evaluated, some may be new or changing and the patient may have concern these could be cancer.   The following portions of the chart were reviewed this encounter and updated as appropriate: medications, allergies, medical history  Review of Systems:  No other skin or systemic complaints except as noted in HPI or Assessment and Plan.  Objective  Well appearing patient in no apparent distress; mood and affect are within normal limits.   A focused examination was performed of the following areas: the face and hands   Relevant exam findings are noted in the Assessment and Plan.  R hand dorsum 0.7 cm pink keratotic papule.   Assessment & Plan    NEOPLASM OF UNCERTAIN BEHAVIOR OF SKIN R hand dorsum Skin / nail biopsy Type of biopsy: tangential   Informed consent: discussed and consent obtained   Timeout: patient name, date of birth, surgical site, and procedure verified   Procedure prep:  Patient was prepped and draped in usual sterile fashion Prep type:  Isopropyl alcohol Anesthesia: the lesion was anesthetized in a standard fashion   Anesthetic:  1% lidocaine  w/ epinephrine  1-100,000 buffered w/ 8.4% NaHCO3 Instrument used: flexible razor blade   Hemostasis achieved with: pressure, aluminum chloride and electrodesiccation   Outcome: patient tolerated procedure well   Post-procedure details: sterile dressing applied and wound care instructions given   Dressing type: bandage (Mupirocin  2% ointment)   Specimen 1 - Surgical pathology Differential Diagnosis: D48.5 r/o SCC Check Margins: No  Return for follow up pending biopsy results.  Arlinda Lais, CMA, am acting as scribe for Harris Liming, MD  .   Documentation: I have reviewed the above documentation for accuracy and completeness, and I agree with the above.  Harris Liming, MD

## 2023-12-19 LAB — SURGICAL PATHOLOGY

## 2023-12-22 ENCOUNTER — Ambulatory Visit: Payer: Self-pay | Admitting: Dermatology

## 2023-12-22 DIAGNOSIS — C4492 Squamous cell carcinoma of skin, unspecified: Secondary | ICD-10-CM

## 2023-12-23 ENCOUNTER — Ambulatory Visit: Admitting: Family

## 2023-12-23 ENCOUNTER — Encounter: Payer: Self-pay | Admitting: Dermatology

## 2023-12-23 ENCOUNTER — Ambulatory Visit: Payer: Self-pay

## 2023-12-23 ENCOUNTER — Encounter: Payer: Self-pay | Admitting: Family

## 2023-12-23 VITALS — BP 124/78 | HR 87 | Temp 97.8°F | Ht 61.0 in | Wt 153.0 lb

## 2023-12-23 DIAGNOSIS — Z780 Asymptomatic menopausal state: Secondary | ICD-10-CM

## 2023-12-23 DIAGNOSIS — Z136 Encounter for screening for cardiovascular disorders: Secondary | ICD-10-CM

## 2023-12-23 DIAGNOSIS — N644 Mastodynia: Secondary | ICD-10-CM | POA: Diagnosis not present

## 2023-12-23 DIAGNOSIS — M810 Age-related osteoporosis without current pathological fracture: Secondary | ICD-10-CM

## 2023-12-23 DIAGNOSIS — R21 Rash and other nonspecific skin eruption: Secondary | ICD-10-CM

## 2023-12-23 DIAGNOSIS — I491 Atrial premature depolarization: Secondary | ICD-10-CM

## 2023-12-23 DIAGNOSIS — I4891 Unspecified atrial fibrillation: Secondary | ICD-10-CM | POA: Diagnosis not present

## 2023-12-23 DIAGNOSIS — Z13220 Encounter for screening for lipoid disorders: Secondary | ICD-10-CM | POA: Diagnosis not present

## 2023-12-23 LAB — COMPREHENSIVE METABOLIC PANEL WITH GFR
ALT: 13 U/L (ref 0–35)
AST: 18 U/L (ref 0–37)
Albumin: 3.9 g/dL (ref 3.5–5.2)
Alkaline Phosphatase: 84 U/L (ref 39–117)
BUN: 24 mg/dL — ABNORMAL HIGH (ref 6–23)
CO2: 30 meq/L (ref 19–32)
Calcium: 8.9 mg/dL (ref 8.4–10.5)
Chloride: 105 meq/L (ref 96–112)
Creatinine, Ser: 0.77 mg/dL (ref 0.40–1.20)
GFR: 68.94 mL/min (ref >60.00)
Glucose, Bld: 87 mg/dL (ref 70–99)
Potassium: 3.7 meq/L (ref 3.5–5.1)
Sodium: 142 meq/L (ref 135–145)
Total Bilirubin: 0.5 mg/dL (ref 0.2–1.2)
Total Protein: 6.7 g/dL (ref 6.0–8.3)

## 2023-12-23 LAB — CBC WITH DIFFERENTIAL/PLATELET
Basophils Absolute: 0.1 10*3/uL (ref 0.0–0.1)
Basophils Relative: 1.1 % (ref 0.0–3.0)
Eosinophils Absolute: 0.2 10*3/uL (ref 0.0–0.7)
Eosinophils Relative: 3 % (ref 0.0–5.0)
HCT: 36.3 % (ref 36.0–46.0)
Hemoglobin: 12.1 g/dL (ref 12.0–15.0)
Lymphocytes Relative: 20.6 % (ref 12.0–46.0)
Lymphs Abs: 1.2 10*3/uL (ref 0.7–4.0)
MCHC: 33.4 g/dL (ref 30.0–36.0)
MCV: 90.1 fl (ref 78.0–100.0)
Monocytes Absolute: 0.4 10*3/uL (ref 0.1–1.0)
Monocytes Relative: 6.9 % (ref 3.0–12.0)
Neutro Abs: 4 10*3/uL (ref 1.4–7.7)
Neutrophils Relative %: 68.4 % (ref 43.0–77.0)
Platelets: 214 10*3/uL (ref 150.0–400.0)
RBC: 4.03 Mil/uL (ref 3.87–5.11)
RDW: 13.2 % (ref 11.5–15.5)
WBC: 5.9 10*3/uL (ref 4.0–10.5)

## 2023-12-23 LAB — LIPID PANEL
Cholesterol: 169 mg/dL (ref 0–200)
HDL: 69.5 mg/dL (ref >39.00)
LDL Cholesterol: 90 mg/dL (ref 0–99)
NonHDL: 99.64
Total CHOL/HDL Ratio: 2
Triglycerides: 48 mg/dL (ref 0.0–149.0)
VLDL: 9.6 mg/dL (ref 0.0–40.0)

## 2023-12-23 LAB — HEMOGLOBIN A1C: Hgb A1c MFr Bld: 5.6 % (ref 4.6–6.5)

## 2023-12-23 MED ORDER — GABAPENTIN 100 MG PO CAPS: 100.0000 mg | ORAL_CAPSULE | Freq: Two times a day (BID) | ORAL | 2 refills | Status: DC

## 2023-12-23 MED ORDER — VALACYCLOVIR HCL 1 G PO TABS: 1000.0000 mg | ORAL_TABLET | Freq: Three times a day (TID) | ORAL | 0 refills | Status: DC

## 2023-12-23 NOTE — Telephone Encounter (Signed)
-----   Message from [place] sent at 12/22/2023  4:38 PM EDT ----- Diagnosis: R hand dorsum :       WELL DIFFERENTIATED SQUAMOUS CELL CARCINOMA, ACANTHOLYTIC (ADENOID) VARIANT,       DEEP MARGIN INVOLVED    Please call with diagnosis and determine where the patient would like to have Mohs surgery.  Explanation: This is a squamous cell skin cancer that has grown beyond the surface of the skin and is invading the second layer of the skin. It has the potential to spread beyond the skin and threaten your health, so I recommend treating it.  Treatment: Given the location and type of skin cancer, I recommend Mohs surgery. Mohs surgery involves cutting out the skin cancer and then checking under the microscope to ensure the whole skin cancer was removed. If any skin cancer remains, the surgeon will cut out more until it is fully removed. The cure rate is about 98-99%. Once the Mohs surgeon confirms the skin cancer is out, they will discuss the options to repair or heal the area. You must take it easy for about two weeks after surgery (no lifting over 10-15 lbs, avoid activity to get your heart rate and blood pressure up). It is done at another office outside of [place] ([place], [place], or [place]).

## 2023-12-23 NOTE — Assessment & Plan Note (Addendum)
 New onset. EKG shows atrial fibrillation. Fortunately asymptomatic. CHADVASC score 3. Colloborated with Dr End via secure chat. No recent falls.  Stop ASA 81mg . Start eliquis 5mg  BID. Counseled on risk of bleeding and to let me know of falls. Referral back to cardiology for follow up.

## 2023-12-23 NOTE — Telephone Encounter (Signed)
 Copied from CRM [id number]. Topic: Clinical - Red Word Triage >> Dec 23, 2023  8:19 AM [place] A wrote: [facility] that prompted transfer to Nurse Triage: Patient is having pain in right breast  Chief Complaint: breast s/s Symptoms: right breast severe pain Frequency: Saturday Pertinent Negatives: Patient denies redness, drainage, fever Disposition: [] ED /[] Urgent Care (no appt availability in office) / [x] Appointment(In office/virtual)/ []  [facility]/ [] Home Care/ [] Refused Recommended Disposition /[] [facility]/ []  Follow-up with PCP  Pt stated right breasts began hurting on Saturday and has not stopped: has taken ibuprofen  without relief.  Would like to come in for further evaluation.   Reason for Disposition  [1] Breast pain AND [2] cause is not known  Answer Assessment - Initial Assessment Questions 1. SYMPTOM: "What's the main symptom you're concerned about?"  (e.g., lump, pain, rash, nipple discharge)     Pain - severe 2. LOCATION: "Where is the right breast located?"     Right breast 3. ONSET: "When did pain  start?"     Saturday 4. PRIOR HISTORY: "Do you have any history of prior problems with your breasts?" (e.g., lumps, cancer, fibrocystic breast disease)     N/a 5. CAUSE: "What do you think is causing this symptom?"     unknown 6. OTHER SYMPTOMS: "Do you have any other symptoms?" (e.g., fever, breast pain, redness or rash, nipple discharge)     no 7. PREGNANCY-BREASTFEEDING: "Is there any chance you are pregnant?" "When was your last menstrual period?" "Are you breastfeeding?"     no  Protocols used: Breast Symptoms-A-AH

## 2023-12-23 NOTE — Progress Notes (Signed)
 Assessment & Plan:  Atrial fibrillation, unspecified type [facility]) Assessment & Plan: New onset. EKG shows atrial fibrillation. Fortunately asymptomatic. CHADVASC score 3. Colloborated with Dr End via secure chat. No recent falls.  Stop ASA 81mg . Start eliquis 5mg  BID. Counseled on risk of bleeding and to let me know of falls. Referral back to cardiology for follow up.   Orders: -     Ambulatory referral to Cardiology  Breast pain, right Assessment & Plan: Unsure if radiation from suspected herpetic pain or etiology of breast itself. Pending diagnostic breast image.   Orders: -     MM 3D DIAGNOSTIC MAMMOGRAM BILATERAL BREAST; Future  Rash Assessment & Plan: Due to pain , concern for early herpes zoster. Start valtrex  and gabapentin . She will let me know how she is doing.   Orders: -     Gabapentin ; Take 1 capsule (100 mg total) by mouth 2 (two) times daily.  Dispense: 60 capsule; Refill: 2 -     valACYclovir  HCl; Take 1 tablet (1,000 mg total) by mouth 3 (three) times daily.  Dispense: 21 tablet; Refill: 0  PAC (premature atrial contraction) -     EKG 12-Lead  Asymptomatic postmenopausal state -     MM 3D DIAGNOSTIC MAMMOGRAM BILATERAL BREAST; Future -     Gabapentin ; Take 1 capsule (100 mg total) by mouth 2 (two) times daily.  Dispense: 60 capsule; Refill: 2 -     DG Bone Density; Future -     valACYclovir  HCl; Take 1 tablet (1,000 mg total) by mouth 3 (three) times daily.  Dispense: 21 tablet; Refill: 0 -     EKG 12-Lead -     CBC with Differential/Platelet -     TSH -     Comprehensive metabolic panel with GFR -     Hemoglobin A1c -     VITAMIN D  25 Hydroxy (Vit-D Deficiency, Fractures) -     Lipid panel  Osteoporosis without current pathological fracture, unspecified osteoporosis type -     DG Bone Density; Future -     VITAMIN D  25 Hydroxy (Vit-D Deficiency, Fractures)  Encounter for lipid screening for cardiovascular disease -     Lipid panel     Return  precautions given.   Risks, benefits, and alternatives of the medications and treatment plan prescribed today were discussed, and patient expressed understanding.   Education regarding symptom management and diagnosis given to patient on AVS either electronically or printed.  No follow-ups on file.  Bascom Bossier, FNP  Subjective:    Patient ID: Elizabeth Fernandez, female    DOB: [birth year], 89 y.o.   MRN: [id number]  CC: Elizabeth Fernandez is a 89 y.o. female who presents today for an acute visit.    HPI: Right breast pain  x 4 days, waxes and wanes.  Pain is not noticeable when standing and making breakfast. Pain more noticeable driving to her appointment today. Pain is noticeable while sitting. She will get temporarily relief. Pain is most diffcult at night as she sleeps on right side and its unconfortable. Pain can be intense at times.   Onset after working in the yard the day prior, She reports itchy 'bug bites' in neck and upper back.  She was working in the yard. She vacuumed 4 days ago. No fall  No cp, palpitations,left arm pain, jaw pain, sob, leg swelling, pain with eating, cough, epigastric pain, dysuria.     H/o SCC, BCC, DVT H/o cholecystectomy  Family h/o  breast cancer  Allergies: Pollen extract Current Outpatient Medications on File Prior to Visit  Medication Sig Dispense Refill   levothyroxine  (SYNTHROID ) 88 MCG tablet TAKE ONE TABLET BY MOUTH DAILY 90 tablet 2   Ascorbic Acid (VITAMIN C PO) Take 1 tablet by mouth daily.     CALCIUM  PO Take 1 tablet by mouth daily.     hydrocortisone  2.5 % lotion Apply topically 2 (two) times daily. Insect bite to face and eyelid itching as needed 59 mL 0   ibandronate  (BONIVA ) 150 MG tablet Take 1 tablet (150 mg total) by mouth every 30 (thirty) days. Take in the morning with a full glass of water, on an empty stomach, and do not take anything else by mouth or lie down for the next 30 min. 6 tablet 1   meloxicam  (MOBIC ) 7.5 MG tablet Take  1 tablet (7.5 mg total) by mouth daily as needed for pain. 30 tablet 1   Multiple Vitamin (MULTIVITAMIN PO) Take 1 tablet by mouth daily.     No current facility-administered medications on file prior to visit.    Review of Systems  Constitutional:  Negative for chills and fever.  Respiratory:  Negative for cough.   Cardiovascular:  Negative for chest pain, palpitations and leg swelling.  Gastrointestinal:  Negative for nausea and vomiting.  Musculoskeletal:  Positive for back pain.  Skin:  Positive for rash.      Objective:    BP 124/78   Pulse 87   Temp 97.8 F (36.6 C) (Oral)   Ht 5\' 1"  (1.549 m)   Wt 153 lb (69.4 kg)   SpO2 99%   BMI 28.91 kg/m   BP Readings from Last 3 Encounters:  12/23/23 124/78  01/16/23 128/80  01/13/23 (!) 167/80   Wt Readings from Last 3 Encounters:  12/24/23 151 lb (68.5 kg)  12/23/23 153 lb (69.4 kg)  06/20/23 147 lb 1.6 oz (66.7 kg)    Physical Exam Vitals reviewed.  Constitutional:      Appearance: She is well-developed.  Eyes:     Conjunctiva/sclera: Conjunctivae normal.  Cardiovascular:     Rate and Rhythm: Normal rate and regular rhythm.     Pulses: Normal pulses.     Heart sounds: Normal heart sounds.  Pulmonary:     Effort: Pulmonary effort is normal.     Breath sounds: Normal breath sounds. No wheezing, rhonchi or rales.  Chest:  Breasts:    Right: Tenderness present. No mass, nipple discharge or skin change.     Left: No swelling, mass, nipple discharge, skin change or tenderness.     Comments: Diffuse right breast tenderness.  Skin:    General: Skin is warm and dry.          Comments: 3cm grouped scabbed ,excoriated lesions right thoracic back  Neurological:     Mental Status: She is alert.  Psychiatric:        Speech: Speech normal.        Behavior: Behavior normal.        Thought Content: Thought content normal.

## 2023-12-23 NOTE — Assessment & Plan Note (Addendum)
 Due to pain , concern for early herpes zoster. Start valtrex  and gabapentin . She will let me know how she is doing.

## 2023-12-23 NOTE — Assessment & Plan Note (Signed)
 Unsure if radiation from suspected herpetic pain or etiology of breast itself. Pending diagnostic breast image.

## 2023-12-23 NOTE — Patient Instructions (Addendum)
 Please call  and schedule your 3D mammogram and  bone density scan as we discussed.   [facility]  ( new location in 2023)  [address], [place], [place] [address]  [place], [place]  [phone]   Trial of gabapentin  twice daily; we can certainly increase this dose for pain   Start valtrex  for suspected shingles   New onset atrial fibrillation   After we get your labs, I will send in anticoagulant, apixaban, once I ensure your kidney function is normal  Appreciate your time today.   Atrial Fibrillation Atrial fibrillation (AFib) is a type of heartbeat that is irregular or fast. If you have AFib, your heart beats without any order. This makes it hard for your heart to pump blood in a normal way. AFib may come and go, or it may become a long-lasting problem. If AFib is not treated, it can put you at higher risk for stroke, heart failure, and other heart problems. What are the causes? AFib may be caused by diseases that damage the heart's electrical system. They include: High blood pressure. Heart failure. Heart valve diseases. Heart surgery. Diabetes. Thyroid  disease. Kidney disease. Lung diseases, such as pneumonia or COPD. Sleep apnea. Sometimes the cause is not known. What increases the risk? You are more likely to develop AFib if: You are older. You exercise often and very hard. You have a family history of AFib. You are female. You are Caucasian. You are overweight. You smoke. You drink a lot of alcohol. What are the signs or symptoms? Common symptoms of this condition include: A feeling that your heart is beating very fast. Chest pain or discomfort. Feeling short of breath. Suddenly feeling light-headed or weak. Getting tired easily during activity. Fainting. Sweating. In some cases, there are no symptoms. How is this treated? Medicines to: Prevent blood clots. Treat heart rate or heart rhythm problems. Using devices, such as a  pacemaker, to correct heart rhythm problems. Doing surgery to remove the part of the heart that sends bad signals. Closing an area where clots can form in the heart (left atrial appendage). In some cases, your doctor will treat other underlying conditions. Follow these instructions at home: Medicines Take over-the-counter and prescription medicines only as told by your doctor. Do not take any new medicines without first talking to your doctor. If you are taking blood thinners: Talk with your doctor before taking aspirin  or NSAIDs, such as ibuprofen . Take your medicines as told. Take them at the same time each day. Do not do things that could hurt or bruise you. Be careful to avoid falls. Wear an alert bracelet or carry a card that says you take blood thinners. Lifestyle Do not smoke or use any products that contain nicotine or tobacco. If you need help quitting, ask your doctor. Eat heart-healthy foods. Talk with your doctor about the right eating plan for you. Exercise regularly as told by your doctor. Do not drink alcohol. Lose weight if you are overweight. General instructions If you have sleep apnea, treat it as told by your doctor. Do not use diet pills unless your doctor says they are safe for you. Diet pills may make heart problems worse. Keep all follow-up visits. Your doctor will check your heart rate and rhythm regularly. Contact a doctor if: You notice a change in the speed, rhythm, or strength of your heartbeat. You are taking a blood-thinning medicine and you get more bruising. You get tired more easily when you move  or exercise. You have a sudden change in weight. Get help right away if:  You have pain in your chest. You have trouble breathing. You have side effects of blood thinners, such as blood in your vomit, poop (stool), or pee (urine), or bleeding that cannot stop. You have any signs of a stroke. "BE FAST" is an easy way to remember the main warning signs: B -  Balance. Dizziness, sudden trouble walking, or loss of balance. E - Eyes. Trouble seeing or a change in how you see. F - Face. Sudden weakness or loss of feeling in the face. The face or eyelid may droop on one side. A - Arms.Weakness or loss of feeling in an arm. This happens suddenly and usually on one side of the body. S - Speech. Sudden trouble speaking, slurred speech, or trouble understanding what people say. T - Time.Time to call emergency services. Write down what time symptoms started. You have other signs of a stroke, such as: A sudden, very bad headache with no known cause. Feeling like you may vomit (nausea). Vomiting. A seizure. These symptoms may be an emergency. Get help right away. Call 911. Do not wait to see if the symptoms will go away. Do not drive yourself to the hospital. This information is not intended to replace advice given to you by your health care provider. Make sure you discuss any questions you have with your health care provider. Document Revised: 04/11/2022 Document Reviewed: 04/11/2022 Elsevier Patient Education  2024 [employer].

## 2023-12-24 ENCOUNTER — Telehealth: Payer: Self-pay | Admitting: Family

## 2023-12-24 ENCOUNTER — Ambulatory Visit (INDEPENDENT_AMBULATORY_CARE_PROVIDER_SITE_OTHER): Payer: PPO | Admitting: *Deleted

## 2023-12-24 ENCOUNTER — Telehealth: Payer: Self-pay

## 2023-12-24 VITALS — Ht 61.0 in | Wt 151.0 lb

## 2023-12-24 DIAGNOSIS — Z0000 Encounter for general adult medical examination without abnormal findings: Secondary | ICD-10-CM | POA: Diagnosis not present

## 2023-12-24 LAB — TSH: TSH: 3.85 u[IU]/mL (ref 0.35–5.50)

## 2023-12-24 LAB — VITAMIN D 25 HYDROXY (VIT D DEFICIENCY, FRACTURES): VITD: 29.44 ng/mL — ABNORMAL LOW (ref 30.00–100.00)

## 2023-12-24 NOTE — Telephone Encounter (Signed)
 Spoke to pt

## 2023-12-24 NOTE — Telephone Encounter (Signed)
 Spoke to pt. Pt is aware to stop aspirin  and start Eliquis. Also informed pt that we are still awaiting lab results. Pt verbalized understanding

## 2023-12-24 NOTE — Patient Instructions (Signed)
 Elizabeth Fernandez , Thank you for taking time out of your busy schedule to complete your Annual Wellness Visit with me. I enjoyed our conversation and look forward to speaking with you again next year. I, as well as your care team,  appreciate your ongoing commitment to your health goals. Please review the following plan we discussed and let me know if I can assist you in the future. Your Game plan/ To Do List    Referrals: If you haven't heard from the office you've been referred to, please reach out to them at the phone provided.  Remember to call and schedule your Dexa/Bone density and an eye exam.  Consider updating your covid and tetanus vaccines.   Follow up Visits: Next Medicare AWV with our clinical staff: 01/01/25 @ 10:50   Have you seen your provider in the last 6 months (3 months if uncontrolled diabetes)? Yes Next Office Visit with your provider: Saw her PCP yesterday 12/23/23 and will schedule an appointment at a later time.  Clinician Recommendations:  Aim for 30 minutes of exercise or brisk walking, 6-8 glasses of water, and 5 servings of fruits and vegetables each day.       This is a list of the screening recommended for you and due dates:  Health Maintenance  Topic Date Due   DTaP/Tdap/Td vaccine (1 - Tdap) Never done   Zoster (Shingles) Vaccine (1 of 2) Never done   COVID-19 Vaccine (3 - Pfizer risk series) 08/21/2020   Flu Shot  03/06/2024   Medicare Annual Wellness Visit  12/23/2024   Pneumonia Vaccine  Completed   DEXA scan (bone density measurement)  Completed   HPV Vaccine  Aged Out   Meningitis B Vaccine  Aged Out    Advanced directives: (Copy Requested) Please bring a copy of your health care power of attorney and living will to the office to be added to your chart at your convenience. You can mail to [facility] [address] [place], [place] [address] or email to [email or web address] Advance Care Planning is important because it:  [x]  Makes  sure you receive the medical care that is consistent with your values, goals, and preferences  [x]  It provides guidance to your family and loved ones and reduces their decisional burden about whether or not they are making the right decisions based on your wishes.

## 2023-12-24 NOTE — Telephone Encounter (Signed)
 Lab team- can you check on lab results from yesterday? Taking longer than usual  Elizabeth Fernandez team Call pt and check on her. I left a vm this morning.  Please start eliquis 2.5mg  BID sample that was provided while we await lab to return as dose my be increased after  lab work  Please stop asa 81mg  once she starts eliquis

## 2023-12-24 NOTE — Telephone Encounter (Signed)
 Copied from CRM [id number]. Topic: General - Other >> Dec 24, 2023 10:10 AM [place] wrote: Reason for CRM: Patient returning call about medication changes-  [phone]

## 2023-12-24 NOTE — Progress Notes (Signed)
 Subjective:   Elizabeth Fernandez is a 89 y.o. who presents for a Medicare Wellness preventive visit.  As a reminder, Annual Wellness Visits don't include a physical exam, and some assessments may be limited, especially if this visit is performed virtually. We may recommend an in-person follow-up visit with your provider if needed.  Visit Complete: Virtual I connected with  [name] on 12/24/23 by a audio enabled telemedicine application and verified that I am speaking with the correct person using two identifiers.  Patient Location: Home  Provider Location: Home Office  I discussed the limitations of evaluation and management by telemedicine. The patient expressed understanding and agreed to proceed.  Vital Signs: Because this visit was a virtual/telehealth visit, some criteria may be missing or patient reported. Any vitals not documented were not able to be obtained and vitals that have been documented are patient reported.  VideoDeclined- This patient declined Librarian, academic. Therefore the visit was completed with audio only.  Persons Participating in Visit: Patient.  AWV Questionnaire: No: Patient Medicare AWV questionnaire was not completed prior to this visit.  Cardiac Risk Factors include: advanced age (>2men, >53 women)     Objective:     Today's Vitals   12/24/23 1054 12/24/23 1055  Weight: 151 lb (68.5 kg)   Height: 5\' 1"  (1.549 m)   PainSc:  5    Body mass index is 28.53 kg/m.     12/24/2023   11:08 AM 01/13/2023    1:31 PM 12/24/2022   12:00 PM 11/30/2021   11:08 AM 05/13/2020   11:20 AM 05/13/2019   12:18 PM 05/09/2018   12:28 PM  Advanced Directives  Does Patient Have a Medical Advance Directive? No Yes Yes Yes Yes Yes Yes  Type of Surveyor, minerals;Living will Healthcare Power of [place];Living will Healthcare Power of [place];Living will Healthcare Power of [place];Living will Healthcare Power  of [place];Living will  Does patient want to make changes to medical advance directive?   No - Patient declined No - Patient declined No - Patient declined No - Patient declined No - Patient declined  Copy of Healthcare Power of Attorney in Chart?   No - copy requested No - copy requested No - copy requested No - copy requested No - copy requested  Would patient like information on creating a medical advance directive? No - Patient declined          Current Medications (verified) Outpatient Encounter Medications as of 12/24/2023  Medication Sig   Ascorbic Acid (VITAMIN C PO) Take 1 tablet by mouth daily.   CALCIUM  PO Take 1 tablet by mouth daily.   gabapentin  (NEURONTIN ) 100 MG capsule Take 1 capsule (100 mg total) by mouth 2 (two) times daily.   hydrocortisone  2.5 % lotion Apply topically 2 (two) times daily. Insect bite to face and eyelid itching as needed   ibandronate  (BONIVA ) 150 MG tablet Take 1 tablet (150 mg total) by mouth every 30 (thirty) days. Take in the morning with a full glass of water, on an empty stomach, and do not take anything else by mouth or lie down for the next 30 min.   levothyroxine  (SYNTHROID ) 88 MCG tablet TAKE ONE TABLET BY MOUTH DAILY   meloxicam  (MOBIC ) 7.5 MG tablet Take 1 tablet (7.5 mg total) by mouth daily as needed for pain.   Multiple Vitamin (MULTIVITAMIN PO) Take 1 tablet by mouth daily.   valACYclovir  (VALTREX ) 1000 MG  tablet Take 1 tablet (1,000 mg total) by mouth 3 (three) times daily.   No facility-administered encounter medications on file as of 12/24/2023.    Allergies (verified) Pollen extract   History: Past Medical History:  Diagnosis Date   Arthritis    both knees   BCC (basal cell carcinoma) 04/28/2021   left distal lateral deltoid, excised 02/20/2022   Cataract    Surgery scheduled for 03/2014   DVT (deep venous thrombosis) (HCC)    Left popliteal vein   SCC (squamous cell carcinoma) 12/16/2023   right hand dorsum   Past  Surgical History:  Procedure Laterality Date   CHOLECYSTECTOMY     FEMUR SURGERY  11/10/2014   HIP ARTHROPLASTY Right 11/09/2017   Procedure: ARTHROPLASTY BIPOLAR HIP (HEMIARTHROPLASTY);  Surgeon: Marlynn Singer, MD;  Location: [facility] ORS;  Service: Orthopedics;  Laterality: Right;   IR FLUORO GUIDED NEEDLE PLC ASPIRATION/INJECTION LOC  01/25/2017   lesion from hand Right 12/2023   Family History  Problem Relation Age of Onset   Cancer Mother    Heart attack Father 85   Osteoporosis Neg Hx    Social History   Socioeconomic History   Marital status: Widowed    Spouse name: Not on file   Number of children: 2   Years of education: Not on file   Highest education level: Not on file  Occupational History   Occupation: Retired  Tobacco Use   Smoking status: Never   Smokeless tobacco: Never  Vaping Use   Vaping status: Never Used  Substance and Sexual Activity   Alcohol use: No    Alcohol/week: 0.0 standard drinks of alcohol   Drug use: No   Sexual activity: Not Currently  Other Topics Concern   Not on file  Social History Narrative   May go back to work after recovery 2016; [employer].    Lives with son    Children- 2 ; one of her sons has drug addiction       Grandchildren- none    Pets: 1 dog inside    Enjoys- Reading and mowing yard    Social Drivers of Corporate investment banker Strain: Low Risk  (12/24/2023)   Overall Financial Resource Strain (CARDIA)    Difficulty of Paying Living Expenses: Not hard at all  Food Insecurity: No Food Insecurity (12/24/2023)   Hunger Vital Sign    Worried About Running Out of Food in the Last Year: Never true    Ran Out of Food in the Last Year: Never true  Transportation Needs: No Transportation Needs (12/24/2023)   PRAPARE - Administrator, Civil Service (Medical): No    Lack of Transportation (Non-Medical): No  Physical Activity: Insufficiently Active (12/24/2023)   Exercise Vital Sign    Days of Exercise  per Week: 3 days    Minutes of Exercise per Session: 30 min  Stress: No Stress Concern Present (12/24/2023)   [employer] of Occupational Health - Occupational Stress Questionnaire    Feeling of Stress : Not at all  Social Connections: Moderately Isolated (12/24/2023)   Social Connection and Isolation Panel [NHANES]    Frequency of Communication with Friends and Family: Twice a week    Frequency of Social Gatherings with Friends and Family: More than three times a week    Attends Religious Services: More than 4 times per year    Active Member of [employer] or Organizations: No    Attends Banker Meetings: Never  Marital Status: Widowed    Tobacco Counseling Counseling given: Not Answered    Clinical Intake:  Pre-visit preparation completed: Yes  Pain : 0-10 Pain Score: 5  Pain Type: Acute pain (has shingles now) Pain Location: Back (and right side shingles) Pain Descriptors / Indicators: Stabbing Pain Onset: In the past 7 days Pain Frequency: Constant     BMI - recorded: 28.53 Nutritional Status: BMI 25 -29 Overweight Nutritional Risks: None Diabetes: No  No results found for: "HGBA1C"   How often do you need to have someone help you when you read instructions, pamphlets, or other written materials from your doctor or pharmacy?: 1 - Never  Interpreter Needed?: No  Information entered by :: R. Debara Kamphuis LPN   Activities of Daily Living     12/24/2023   10:58 AM  In your present state of health, do you have any difficulty performing the following activities:  Hearing? 0  Vision? 0  Comment glasses  Difficulty concentrating or making decisions? 0  Walking or climbing stairs? 1  Dressing or bathing? 0  Doing errands, shopping? 0  Preparing Food and eating ? N  Using the Toilet? N  In the past six months, have you accidently leaked urine? N  Do you have problems with loss of bowel control? N  Managing your Medications? N  Managing your Finances? N   Housekeeping or managing your Housekeeping? N    Patient Care Team: Calista Catching, FNP as PCP - General (Family Medicine)  Indicate any recent Medical Services you may have received from other than [facility] providers in the past year (date may be approximate).     Assessment:    This is a routine wellness examination for Ikran.  Hearing/Vision screen Hearing Screening - Comments:: No issues Vision Screening - Comments:: glasses   Goals Addressed             This Visit's Progress    Patient Stated       Wants to maintain       Depression Screen     12/24/2023   11:02 AM 12/23/2023   10:46 AM 06/20/2023   12:08 PM 06/20/2023   11:58 AM 01/16/2023    2:52 PM 12/24/2022   11:44 AM 10/12/2022    3:20 PM  PHQ 2/9 Scores  PHQ - 2 Score 0 0 0 0 0 0 0  PHQ- 9 Score 4          Fall Risk     12/24/2023   11:00 AM 12/23/2023   10:46 AM 06/20/2023   11:58 AM 01/16/2023    2:52 PM 01/16/2023    2:51 PM  Fall Risk   Falls in the past year? 0 0 0 0 0  Number falls in past yr: 0 0 0 0 0  Injury with Fall? 0 0 0 0 0  Risk for fall due to : No Fall Risks No Fall Risks No Fall Risks No Fall Risks No Fall Risks  Follow up Falls prevention discussed;Falls evaluation completed Falls evaluation completed Falls evaluation completed Falls evaluation completed Falls evaluation completed    MEDICARE RISK AT HOME:  Medicare Risk at Home Any stairs in or around the home?: Yes If so, are there any without handrails?: No Home free of loose throw rugs in walkways, pet beds, electrical cords, etc?: Yes Adequate lighting in your home to reduce risk of falls?: Yes Life alert?: No Use of a cane, walker or w/c?: Yes Grab bars in the  bathroom?: No Shower chair or bench in shower?: Yes Elevated toilet seat or a handicapped toilet?: No  TIMED UP AND GO:  Was the test performed?  No  Cognitive Function: 6CIT completed    10/24/2016    2:22 PM 10/24/2015    1:38 PM  MMSE - Mini Mental  State Exam  Orientation to time 5 5  Orientation to Place 5 5  Registration 3 3  Attention/ Calculation 5 5  Recall 3 3  Language- name 2 objects 2 2  Language- repeat 1 1  Language- follow 3 step command 3 3  Language- read & follow direction 1 1  Write a sentence 1 1  Copy design 1 1  Total score 30 30        12/24/2022   11:51 AM 11/30/2021   11:20 AM 05/13/2020   11:28 AM 05/13/2019   12:21 PM 05/09/2018   12:32 PM  6CIT Screen  What Year? 0 points 0 points 0 points 0 points 0 points  What month? 0 points 0 points 0 points 0 points 0 points  What time? 0 points 0 points 0 points 0 points 0 points  Count back from 20 4 points 0 points  0 points 0 points  Months in reverse 0 points 0 points 0 points 0 points 0 points  Repeat phrase 0 points 0 points  0 points 0 points  Total Score 4 points 0 points  0 points 0 points    Immunizations Immunization History  Administered Date(s) Administered   Fluad Quad(high Dose 65+) 04/30/2019, 05/07/2022   Influenza, High Dose Seasonal PF 06/19/2016, 05/09/2018, 05/23/2021   Influenza,inj,Quad PF,6+ Mos 07/26/2015   Influenza-Unspecified 06/06/2020   Moderna SARS-COV2 Booster Vaccination 07/24/2020   PFIZER(Purple Top)SARS-COV-2 Vaccination 08/21/2019, 09/11/2019   PPD Test 11/18/2014   Pneumococcal Conjugate-13 10/24/2016   Pneumococcal Polysaccharide-23 04/30/2019    Screening Tests Health Maintenance  Topic Date Due   DTaP/Tdap/Td (1 - Tdap) Never done   Zoster Vaccines- Shingrix (1 of 2) Never done   COVID-19 Vaccine (3 - Pfizer risk series) 08/21/2020   Medicare Annual Wellness (AWV)  12/24/2023   INFLUENZA VACCINE  03/06/2024   Pneumonia Vaccine 64+ Years old  Completed   DEXA SCAN  Completed   HPV VACCINES  Aged Out   Meningococcal B Vaccine  Aged Out    Health Maintenance  Health Maintenance Due  Topic Date Due   DTaP/Tdap/Td (1 - Tdap) Never done   Zoster Vaccines- Shingrix (1 of 2) Never done   COVID-19  Vaccine (3 - Pfizer risk series) 08/21/2020   Medicare Annual Wellness (AWV)  12/24/2023   Health Maintenance Items Addressed: Declines covid and tetanus vaccines..  Patient has shingles now and uncertain about shingles vaccines in the future. Dexa was ordered by PCP at office visit yesterday.  Additional Screening:  Vision Screening: Recommended annual ophthalmology exams for early detection of glaucoma and other disorders of the eye. Not up to date. Patient stated that she will decide where she wants to go and will call and schedule an appointment.  Dental Screening: Recommended annual dental exams for proper oral hygiene  Community Resource Referral / Chronic Care Management: CRR required this visit?  No   CCM required this visit?  No   Plan:    I have personally reviewed and noted the following in the patient's chart:   Medical and social history Use of alcohol, tobacco or illicit drugs  Current medications and supplements including opioid prescriptions.  Patient is not currently taking opioid prescriptions. Functional ability and status Nutritional status Physical activity Advanced directives List of other physicians Hospitalizations, surgeries, and ER visits in previous 12 months Vitals Screenings to include cognitive, depression, and falls Referrals and appointments  In addition, I have reviewed and discussed with patient certain preventive protocols, quality metrics, and best practice recommendations. A written personalized care plan for preventive services as well as general preventive health recommendations were provided to patient.   Felicitas Horse, LPN   11/12/8117   After Visit Summary: (MyChart) Due to this being a telephonic visit, the after visit summary with patients personalized plan was offered to patient via MyChart   Notes: Nothing significant to report at this time.

## 2023-12-25 ENCOUNTER — Ambulatory Visit: Payer: Self-pay | Admitting: Family

## 2023-12-25 DIAGNOSIS — I4891 Unspecified atrial fibrillation: Secondary | ICD-10-CM

## 2023-12-25 MED ORDER — APIXABAN 5 MG PO TABS
5.0000 mg | ORAL_TABLET | Freq: Two times a day (BID) | ORAL | 2 refills | Status: DC
Start: 2023-12-25 — End: 2024-01-02

## 2023-12-25 NOTE — Telephone Encounter (Signed)
 noted

## 2023-12-27 ENCOUNTER — Other Ambulatory Visit: Payer: Self-pay | Admitting: Family

## 2023-12-27 DIAGNOSIS — N644 Mastodynia: Secondary | ICD-10-CM

## 2023-12-30 ENCOUNTER — Other Ambulatory Visit: Payer: Self-pay

## 2023-12-30 ENCOUNTER — Emergency Department
Admission: EM | Admit: 2023-12-30 | Discharge: 2023-12-30 | Disposition: A | Discharge status: Home / Self Care | Attending: Emergency Medicine | Admitting: Emergency Medicine

## 2023-12-30 ENCOUNTER — Emergency Department

## 2023-12-30 DIAGNOSIS — K5730 Diverticulosis of large intestine without perforation or abscess without bleeding: Secondary | ICD-10-CM | POA: Diagnosis not present

## 2023-12-30 DIAGNOSIS — Z7901 Long term (current) use of anticoagulants: Secondary | ICD-10-CM | POA: Diagnosis not present

## 2023-12-30 DIAGNOSIS — M5459 Other low back pain: Secondary | ICD-10-CM | POA: Diagnosis not present

## 2023-12-30 DIAGNOSIS — Z9049 Acquired absence of other specified parts of digestive tract: Secondary | ICD-10-CM | POA: Diagnosis not present

## 2023-12-30 DIAGNOSIS — G8929 Other chronic pain: Secondary | ICD-10-CM | POA: Diagnosis not present

## 2023-12-30 DIAGNOSIS — M48061 Spinal stenosis, lumbar region without neurogenic claudication: Secondary | ICD-10-CM | POA: Diagnosis not present

## 2023-12-30 DIAGNOSIS — M549 Dorsalgia, unspecified: Secondary | ICD-10-CM | POA: Diagnosis not present

## 2023-12-30 DIAGNOSIS — M545 Low back pain, unspecified: Secondary | ICD-10-CM | POA: Insufficient documentation

## 2023-12-30 DIAGNOSIS — Z85828 Personal history of other malignant neoplasm of skin: Secondary | ICD-10-CM | POA: Diagnosis not present

## 2023-12-30 MED ORDER — LIDOCAINE 5 % EX PTCH
1.0000 | MEDICATED_PATCH | CUTANEOUS | Status: DC
  Administered 2023-12-30: 1 via TRANSDERMAL
  Filled 2023-12-30: qty 1

## 2023-12-30 MED ORDER — LIDOCAINE 5 % EX PTCH: 1.0000 | MEDICATED_PATCH | CUTANEOUS | 0 refills | Status: AC

## 2023-12-30 MED ORDER — OXYCODONE HCL 5 MG PO TABS: 5.0000 mg | ORAL_TABLET | Freq: Four times a day (QID) | ORAL | 0 refills | Status: DC | PRN

## 2023-12-30 MED ORDER — GABAPENTIN 100 MG PO CAPS
200.0000 mg | ORAL_CAPSULE | Freq: Once | ORAL | Status: AC
  Administered 2023-12-30: 200 mg via ORAL
  Filled 2023-12-30: qty 2

## 2023-12-30 MED ORDER — LIDOCAINE 5 % EX PTCH: 1.0000 | MEDICATED_PATCH | CUTANEOUS | 0 refills | Status: DC

## 2023-12-30 MED ORDER — ACETAMINOPHEN 500 MG PO TABS
1000.0000 mg | ORAL_TABLET | Freq: Once | ORAL | Status: AC
  Administered 2023-12-30: 1000 mg via ORAL
  Filled 2023-12-30: qty 2

## 2023-12-30 MED ORDER — OXYCODONE HCL 5 MG PO TABS
5.0000 mg | ORAL_TABLET | Freq: Once | ORAL | Status: AC
  Administered 2023-12-30: 5 mg via ORAL
  Filled 2023-12-30: qty 1

## 2023-12-30 NOTE — Discharge Instructions (Addendum)
 You can take 650 mg of Tylenol  every 6 hours as needed for pain.  Also use the Lidoderm  patches daily.  Please reserve the oxycodone  for severe breakthrough pain, the oxycodone  can make you sleepy, try to reserve it for at night, please do not drive or operate heavy machinery when on the oxycodone .  You can also use the gabapentin  for pain as well.

## 2023-12-30 NOTE — ED Notes (Signed)
Patient is able to ambulate with minimal assistance.

## 2023-12-30 NOTE — ED Provider Notes (Signed)
 [facility] Provider Note    Event Date/Time   First MD Initiated Contact with Patient 12/30/23 1835     (approximate)   History   Back Pain   HPI  Elizabeth Fernandez is a 89 y.o. female with history of DVT on Eliquis, history of basal cell carcinoma, presenting with left lower paralumbar tenderness for the last several days.  Patient states that she has been taking gabapentin  without improvement.  Pain is reproducible, nonradiating, not associated with any incontinence, saddle anesthesia, urinary symptoms, weakness or numbness.  Patient states that she lives at home with her son, is able to ambulate with a cane.  States that she is on medications for shingles on her right.  She denies any rash on her left.  She denies any trauma or falls.  Dates that the pain comes and goes but when it occurs, it is sharp.  Per independent history from EMS, has been having lower back pain for 4 days, no trauma or falls.  Is getting treated for shingles on her right torso for 2 weeks.   On independent chart review, she was seen by her primary care doctor in May, has history of A-fib, was started on Eliquis for it, also has history of DVT, does have history of osteoporosis.  Physical Exam   Triage Vital Signs: ED Triage Vitals  Encounter Vitals Group     BP      Systolic BP Percentile      Diastolic BP Percentile      Pulse      Resp      Temp      Temp src      SpO2      Weight      Height      Head Circumference      Peak Flow      Pain Score      Pain Loc      Pain Education      Exclude from Growth Chart     Most recent vital signs: Vitals:   12/30/23 1841 12/30/23 1930  BP: 119/81 122/78  Pulse: (!) 105 66  Resp: 18   Temp: (!) 97.5 F (36.4 C)   SpO2: 97% 94%     General: Awake, no distress.  CV:  Good peripheral perfusion.  Resp:  Normal effort.  Abd:  No distention.  Other:  No midline spinal tenderness, she does have point tenderness to the mid  lower paralumbar region without overlying lesions, rash, ecchymosis, swelling, fluctuance.  She has no saddle anesthesia, she has no focal weakness or numbness, she has palpable DP pulses.   ED Results / Procedures / Treatments   Labs (all labs ordered are listed, but only abnormal results are displayed) Labs Reviewed - No data to display  RADIOLOGY On my independent interpretation, CT without obvious fracture   PROCEDURES:  Critical Care performed: No  Procedures   MEDICATIONS ORDERED IN ED: Medications  lidocaine  (LIDODERM ) 5 % 1 patch (1 patch Transdermal Patch Applied 12/30/23 1851)  acetaminophen  (TYLENOL ) tablet 1,000 mg (1,000 mg Oral Given 12/30/23 1851)  oxyCODONE  (Oxy IR/ROXICODONE ) immediate release tablet 5 mg (5 mg Oral Given 12/30/23 1851)  gabapentin  (NEURONTIN ) capsule 200 mg (200 mg Oral Given 12/30/23 2025)     IMPRESSION / MDM / ASSESSMENT AND PLAN / ED COURSE  I reviewed the triage vital signs and the nursing notes.  Differential diagnosis includes, but is not limited to, strain, sprain, muscle tear, patient denies any recent trauma or falls, does have history of osteoporosis, no midline spinal tenderness, however did consider possible pathologic fracture, will get a CT of her lumbar spine.  She has no saddle anesthesia, focal weakness or numbness to suggest cauda equina.  Will give her some Tylenol , oxycodone , Lidoderm  patch.  Reassess.  Patient's presentation is most consistent with acute presentation with potential threat to life or bodily function.  Independent interpretation of imaging below.  On reassessment patient is feeling a lot better, will ambulate her and plan to discharge for outpatient follow-up.  Shared decision making done with patient and she is agreeable plan for outpatient management.  She is able to ambulate.  Will give her a prescription for Lidoderm  patches.  Will give her some oxycodone  for severe breakthrough  pain but otherwise instructed her to take Tylenol  as needed for pain.  Will instruct her to follow-up with her primary care doctor for further management.  Considered but no indication for inpatient admission at this time, she safe for outpatient management.  Will discharge with strict return precautions.    Clinical Course as of 12/30/23 2202  Mon Dec 30, 2023  1931 CT Lumbar Spine Wo Contrast IMPRESSION: No acute displaced fracture or traumatic listhesis of the lumbar spine   [TT]    Clinical Course User Index [TT] Shane Darling, MD     FINAL CLINICAL IMPRESSION(S) / ED DIAGNOSES   Final diagnoses:  Acute left-sided low back pain without sciatica     Rx / DC Orders   ED Discharge Orders          Ordered    lidocaine  (LIDODERM ) 5 %  Every 24 hours,   Status:  Discontinued        12/30/23 1944    oxyCODONE  (ROXICODONE ) 5 MG immediate release tablet  Every 6 hours PRN,   Status:  Discontinued        12/30/23 2110    lidocaine  (LIDODERM ) 5 %  Every 24 hours        12/30/23 2201    oxyCODONE  (ROXICODONE ) 5 MG immediate release tablet  Every 6 hours PRN        12/30/23 2201             Note:  This document was prepared using Dragon voice recognition software and may include unintentional dictation errors.    Shane Darling, MD 12/30/23 [phone]

## 2023-12-30 NOTE — ED Triage Notes (Signed)
 Pt to ED via [facility] from home for c/o lower left back pain x 4 days, described as sharp and rated at 10/10. Pt has shingles on right torso x 2 weeks.

## 2024-01-01 ENCOUNTER — Telehealth: Payer: Self-pay

## 2024-01-01 ENCOUNTER — Ambulatory Visit: Payer: Self-pay

## 2024-01-01 DIAGNOSIS — I4891 Unspecified atrial fibrillation: Secondary | ICD-10-CM

## 2024-01-01 NOTE — Telephone Encounter (Signed)
 Copied from CRM [id number]. Topic: Clinical - Red Word Triage >> Jan 01, 2024 10:45 AM Dimple Francis wrote: Red Word that prompted transfer to Nurse Triage: Patient was in the ER on 5/26 with lower back pain and shingles. She is still in severe pain, wanting to get more pain medication   Chief Complaint: Back Pain  Symptoms: Back Pain  Frequency: Ongoing  Pertinent Negatives: Patient denies numbness, tingling, weakness,  Disposition: [] ED /[] Urgent Care (no appt availability in office) / [x] Appointment(In office/virtual)/ []  [facility]/ [] Home Care/ [] Refused Recommended Disposition /[] [facility]/ []  Follow-up with PCP  Additional Notes: FD is being triaged for pain that is localized in The patient was seen in office a week ago and in the ER Monday for lower back pain. The patient was given Oxycodone  and Lidocaine  for pain control. The patient states she only has one Oxycodone  left. The patient also states she has shingles. Currently rates the pain as a 6-7 on a numeric pain scale. Also reports taking OTC Tylenol . In office appointment made for Friday, May 30 th, 2025.   The patient requests to be contacted if there is a cancellation for an earlier appointment.   Reason for Disposition  [1] MODERATE back pain (e.g., interferes with normal activities) AND [2] present > 3 days  Answer Assessment - Initial Assessment Questions 1. ONSET: "When did the pain begin?"      2 Weeks ago  2. LOCATION: "Where does it hurt?" (upper, mid or lower back)     Lower left Back  3. SEVERITY: "How bad is the pain?"  (e.g., Scale 1-10; mild, moderate, or severe)   - MILD (1-3): Doesn't interfere with normal activities.    - MODERATE (4-7): Interferes with normal activities or awakens from sleep.    - SEVERE (8-10): Excruciating pain, unable to do any normal activities.      Moderate to Severe  4. PATTERN: "Is the pain constant?" (e.g., yes, no; constant, intermittent)       Intermittent to constant  5. RADIATION: "Does the pain shoot into your legs or somewhere else?"     No  6. CAUSE:  "What do you think is causing the back pain?"      Unsure  7. BACK OVERUSE:  "Any recent lifting of heavy objects, strenuous work or exercise?"     No  8. MEDICINES: "What have you taken so far for the pain?" (e.g., nothing, acetaminophen , NSAIDS)     No  9. NEUROLOGIC SYMPTOMS: "Do you have any weakness, numbness, or problems with bowel/bladder control?"     No  10. OTHER SYMPTOMS: "Do you have any other symptoms?" (e.g., fever, abdomen pain, burning with urination, blood in urine)       No  11. PREGNANCY: "Is there any chance you are pregnant?" "When was your last menstrual period?"       No and No  Protocols used: Back Pain-A-AH

## 2024-01-01 NOTE — Telephone Encounter (Signed)
 Copied from CRM [id number]. Topic: Clinical - Prescription Issue >> Dec 31, 2023  5:22 PM Chuck Crater wrote: Reason for [id number] wants her apixaban (ELIQUIS) 5 MG TABS tablet sent to moved to [facility] [address] [place] [address]. She states that [employer] [employer] is too expensive.

## 2024-01-02 MED ORDER — APIXABAN 5 MG PO TABS: 5.0000 mg | ORAL_TABLET | Freq: Two times a day (BID) | ORAL | 2 refills | Status: DC

## 2024-01-02 NOTE — Telephone Encounter (Signed)
 Medication sent to [facility].

## 2024-01-02 NOTE — Addendum Note (Signed)
 Addended by: Adalaya Irion on: 01/02/2024 02:49 PM   Modules accepted: Orders

## 2024-01-03 ENCOUNTER — Inpatient Hospital Stay: Admitting: Family Medicine

## 2024-01-06 ENCOUNTER — Telehealth: Payer: Self-pay

## 2024-01-06 NOTE — Telephone Encounter (Signed)
 Spoke with patient regarding bx results and advised her they showed SCC, Mohs recommended. She would like to do some research and call us  back with her decision. Sue Em., RMA

## 2024-01-08 NOTE — Telephone Encounter (Signed)
 Patient called to let Fredrik Jensen and Dr. Felipe Horton know she is going to her cardiology appt Monday and will call the office after that appointment. aw

## 2024-01-13 ENCOUNTER — Encounter: Payer: Self-pay | Admitting: Cardiology

## 2024-01-13 ENCOUNTER — Ambulatory Visit: Attending: Cardiology | Admitting: Cardiology

## 2024-01-13 VITALS — BP 140/66 | HR 104 | Ht 62.0 in | Wt 158.6 lb

## 2024-01-13 DIAGNOSIS — I491 Atrial premature depolarization: Secondary | ICD-10-CM

## 2024-01-13 DIAGNOSIS — I4891 Unspecified atrial fibrillation: Secondary | ICD-10-CM | POA: Diagnosis not present

## 2024-01-13 DIAGNOSIS — R030 Elevated blood-pressure reading, without diagnosis of hypertension: Secondary | ICD-10-CM | POA: Diagnosis not present

## 2024-01-13 MED ORDER — METOPROLOL SUCCINATE ER 25 MG PO TB24: 12.5000 mg | ORAL_TABLET | Freq: Every day | ORAL | 3 refills | Status: DC

## 2024-01-13 NOTE — Patient Instructions (Signed)
 Medication Instructions:  Your physician recommends the following medication changes.  START TAKING: Metoprolol 12.5 mg daily  *If you need a refill on your cardiac medications before your next appointment, please call your pharmacy*  Lab Work: No labs ordered today  If you have labs (blood work) drawn today and your tests are completely normal, you will receive your results only by: MyChart Message (if you have MyChart) OR A paper copy in the mail If you have any lab test that is abnormal or we need to change your treatment, we will call you to review the results.  Testing/Procedures: Your physician has requested that you have an echocardiogram. Echocardiography is a painless test that uses sound waves to create images of your heart. It provides your doctor with information about the size and shape of your heart and how well your heart's chambers and valves are working.   You may receive an ultrasound enhancing agent through an IV if needed to better visualize your heart during the echo. This procedure takes approximately one hour.  There are no restrictions for this procedure.  This will take place at [address] [facility]) [address], [place] [address]  Please note: We ask at that you not bring children with you during ultrasound (echo/ vascular) testing. Due to room size and safety concerns, children are not allowed in the ultrasound rooms during exams. Our front office staff cannot provide observation of children in our lobby area while testing is being conducted. An adult accompanying a patient to their appointment will only be allowed in the ultrasound room at the discretion of the ultrasound technician under special circumstances. We apologize for any inconvenience.   Follow-Up: At [facility], you and your health needs are our priority.  As part of our continuing mission to provide you with exceptional heart care, our providers are all part of one team.   This team includes your primary Cardiologist (physician) and Advanced Practice Providers or APPs (Physician Assistants and Nurse Practitioners) who all work together to provide you with the care you need, when you need it.  Your next appointment:   6 week(s)  Provider:   Ronald Cockayne, NP

## 2024-01-13 NOTE — Progress Notes (Signed)
 Cardiology Office Note   Date:  01/13/2024  ID:  Elizabeth Fernandez, DOB May 11, 1934, MRN [id number] PCP: Calista Catching, FNP  [facility] Providers Cardiologist:  None     History of Present Illness Elizabeth Fernandez is a 89 y.o. female with a past medical history of PACs, history of DVT, arthritis, degenerative joint disease, lumbar radiculitis, and cataracts, who is here today for follow-up.  She was last seen in clinic 09/06/2021 by Dr. Nolan Battle.  She was being evaluated for irregular heartbeat with concern for atrial fibrillation at the request of her PCP.  She denied any palpitations as well as chest pain, shortness of breath, lightheadedness or edema.  She did not have a history of heart problems nor had she had any cardiovascular testing.  There was no evidence of atrial fibrillation noted on EKGs.  She was scheduled for lab work.  There was no further cardiac testing that was recommended and no need for anticoagulation.  She was advised that she can return to clinic on an as-needed basis.  She returns to clinic today after recently being found to be in atrial fibrillation by her PCP.  She states that she had recently been diagnosed with shingles and has been in quite amount of nerve pain and was treated for the shingles.  Unfortunately she continues to be a little uncomfortable today.  She states that she does have fatigue, shortness of breath, and peripheral edema.  She states that she was not sure if her symptoms were brought on by the shingles or the atrial fibrillation.  She stated that her primary care provider had started her on apixaban .  She stated she was taking it twice daily without any concerns for bleeding with no blood noted in her urine or stool.  She stated that she did have a small area into the right cheek where it first she thought it was a bug bite and that she was concerned that it may have been related to the apixaban .  She denies any hospitalizations or visits to the emergency  department.  ROS: 10 point review of systems has been reviewed and considered negative the exception was been listed in the HPI  Studies Reviewed EKG Interpretation Date/Time:  Monday January 13 2024 10:53:13 EDT Ventricular Rate:  104 PR Interval:    QRS Duration:  74 QT Interval:  334 QTC Calculation: 439 R Axis:   -15  Text Interpretation: Atrial fibrillation with rapid ventricular response Nonspecific T wave abnormality When compared with ECG of 07-Nov-2017 23:06, PREVIOUS ECG IS PRESENT Confirmed by Ronald Cockayne ([id number]) on 01/13/2024 3:24:29 PM     Risk Assessment/Calculations  CHA2DS2-VASc Score = 3   This indicates a 3.2% annual risk of stroke. The patient's score is based upon: CHF History: 0 HTN History: 0 Diabetes History: 0 Stroke History: 0 Vascular Disease History: 0 Age Score: 2 Gender Score: 1        Physical Exam VS:  BP (!) 140/66 (BP Location: Left Arm, Patient Position: Sitting, Cuff Size: Normal)   Pulse (!) 104   Ht 5\' 2"  (1.575 m)   Wt 158 lb 9.6 oz (71.9 kg)   SpO2 96%   BMI 29.01 kg/m    Wt Readings from Last 3 Encounters:  01/13/24 158 lb 9.6 oz (71.9 kg)  12/30/23 143 lb 3.2 oz (65 kg)  12/24/23 151 lb (68.5 kg)    GEN: Well nourished, well developed in no acute distress NECK: No JVD; No carotid bruits  CARDIAC: IR IR, no murmurs, rubs, gallops RESPIRATORY:  Clear to auscultation without rales, wheezing or rhonchi  ABDOMEN: Soft, non-tender, non-distended EXTREMITIES:  1+ pitting edema; No deformity   ASSESSMENT AND PLAN New onset atrial fibrillation longstanding history of PACs.  EKG today reveals rate controlled atrial fibrillation with a rate of 104.  Patient was previously started on apixaban  5 mg twice daily for CHA2DS2-VASc of at least 3 for stroke prophylaxis by her PCP.  She denies any issues with bleeding with no blood noted in her urine or stool.  For elevated heart rates today she is being started on Toprol-XL 12.5 mg daily.  She  is also being scheduled for an updated echocardiogram.  We did discuss cardioversion procedure as the patient has been on blood thinners at this point for approximately 3 weeks with no missed doses.  Patient would like to hold off on cardioversion procedure until after echocardiogram has been completed and she has done with some of the various medications that she has from her previous shingles outbreak which is reasonable.  Elevated blood pressure without the diagnosis of hypertension with a blood pressure today 140/66.  Patient is not on any antihypertensive medications as she has slightly in discomfort due to nerve pain related to her shingles.  She has previously not had elevated blood pressures noted when visits at her PCP.  Will continue to monitor with starting Toprol-XL 12.5 mg daily today.  She has been encouraged to monitor her blood pressures at home 1 to 2 hours postmedication administration as well.        Dispo: Patient to return to clinic to see MD/APP in 6 weeks or sooner if needed  Signed, Jabier Deese, NP

## 2024-01-28 ENCOUNTER — Ambulatory Visit: Attending: Cardiology

## 2024-01-28 ENCOUNTER — Ambulatory Visit: Payer: Self-pay | Admitting: Cardiology

## 2024-01-28 ENCOUNTER — Ambulatory Visit: Payer: Self-pay

## 2024-01-28 DIAGNOSIS — I4891 Unspecified atrial fibrillation: Secondary | ICD-10-CM

## 2024-01-28 LAB — ECHOCARDIOGRAM COMPLETE
AR max vel: 2.45 cm2
AV Area VTI: 2.17 cm2
AV Area mean vel: 2.47 cm2
AV Mean grad: 2 mmHg
AV Peak grad: 4.2 mmHg
Ao pk vel: 1.02 m/s
Area-P 1/2: 4.06 cm2
Calc EF: 48.6 %
S' Lateral: 3.6 cm
Single Plane A2C EF: 33.5 %
Single Plane A4C EF: 55.2 %

## 2024-01-28 NOTE — Telephone Encounter (Signed)
 FYI Only or Action Required?: FYI only for provider.  Patient was last seen in primary care on 12/23/2023 by Dineen Rollene MATSU, FNP. Called Nurse Triage reporting Leg Swelling. Symptoms began several days ago. Interventions attempted: Nothing. Symptoms are: stable.  Triage Disposition: See PCP When Office is Open (Within 3 Days) See note   Patient/caregiver understands and will follow disposition?: yes     Copied from CRM [id number]. Topic: Clinical - Red Word Triage >> Jan 28, 2024 12:14 PM Martinique E wrote: [facility] that prompted transfer to Nurse Triage: Lower extremity swelling. Patient stated both of her legs are swollen and painful. Symptoms have been going on since she started gabapentin . Reason for Disposition  [1] MILD swelling of both ankles (i.e., pedal edema) AND [2] new-onset or worsening  Answer Assessment - Initial Assessment Questions 1. ONSET: When did the swelling start? (e.g., minutes, hours, days)     ------------- 1 week     2. LOCATION: What part of the leg is swollen?  Are both legs swollen or just one leg?     ------------- bilaterally     3. SEVERITY: How bad is the swelling? (e.g., localized; mild, moderate, severe)   - Localized: Small area of swelling localized to one leg.   - MILD pedal edema: Swelling limited to foot and ankle, pitting edema < 1/4 inch (6 mm) deep, rest and elevation eliminate most or all swelling.   - MODERATE edema: Swelling of lower leg to knee, pitting edema > 1/4 inch (6 mm) deep, rest and elevation only partially reduce swelling.   - SEVERE edema: Swelling extends above knee, facial or hand swelling present.      -----------------Knee down to feet    4. REDNESS: Does the swelling look red or infected?     ---------------------------Denies   5. PAIN: Is the swelling painful to touch? If Yes, ask: How painful is it?   (Scale 1-10; mild, moderate or severe)     -------------------8/10 ( difficulty to walk)   6.  FEVER: Do you have a fever? If Yes, ask: What is it, how was it measured, and when did it start?      --------------------Denies    7. CAUSE: What do you think is causing the leg swelling?     -unsure, thinks its related to the gabapentin .( Last dose- Saturday)   8. MEDICAL HISTORY: Do you have a history of blood clots (e.g., DVT), cancer, heart failure, kidney disease, or liver failure?     ------Unsure    9. RECURRENT SYMPTOM: Have you had leg swelling before? If Yes, ask: When was the last time? What happened that time?     -------------------- Denies    10. OTHER SYMPTOMS: Do you have any other symptoms? (e.g., chest pain, difficulty breathing)       ------------ Denies       Additional information:  Unable to schedule pt with PCP ( or anyone in office) d/t lack of availability. Referred pt to local UC or ED.   Patient educated on pertinent s/s that would warrant emergent help/call 911 Patient verbalized understanding and agrees with plan No additional questions/concerns noted during the time of the call.  Protocols used: Leg Swelling and Edema-A-AH

## 2024-01-28 NOTE — Telephone Encounter (Signed)
 Spoke to pt she has an appt with echo on 01/29/24

## 2024-01-28 NOTE — Telephone Encounter (Signed)
 Spoke to pt she states that her legs are really swollen, she is going to try and call back where she had Echocardiogram done to see if they will see her, she will call back to speak to Elizabeth Fernandez, (please put her through) . So I can instruct her on what t0 do next because we have no appts in our office this week

## 2024-01-28 NOTE — Progress Notes (Signed)
 Heart squeeze is noted to be 40-45% which is mild to moderately reduced, there is moderate thickness noted in the left lower portion of the heart the right lower function is normal in size normal.  There is moderately elevated pulmonary artery systolic pressure coming back from the lungs, the left lower severely dilated and the right is moderately dilated, there is moderate leakage in the mitral and tricuspid valve.  Heart function is decreased from prior studies and leakage in the valves has not worsened.  Recommend moving follow-up appointment between earlier date to discuss medication changes and treatment options.

## 2024-01-29 ENCOUNTER — Ambulatory Visit: Attending: Cardiology | Admitting: Cardiology

## 2024-01-29 ENCOUNTER — Encounter: Payer: Self-pay | Admitting: Cardiology

## 2024-01-29 VITALS — BP 138/70 | HR 83 | Ht 63.0 in | Wt 158.0 lb

## 2024-01-29 DIAGNOSIS — Z79899 Other long term (current) drug therapy: Secondary | ICD-10-CM | POA: Diagnosis not present

## 2024-01-29 DIAGNOSIS — R600 Localized edema: Secondary | ICD-10-CM

## 2024-01-29 DIAGNOSIS — I10 Essential (primary) hypertension: Secondary | ICD-10-CM

## 2024-01-29 DIAGNOSIS — I429 Cardiomyopathy, unspecified: Secondary | ICD-10-CM | POA: Diagnosis not present

## 2024-01-29 DIAGNOSIS — I4891 Unspecified atrial fibrillation: Secondary | ICD-10-CM | POA: Diagnosis not present

## 2024-01-29 MED ORDER — FUROSEMIDE 20 MG PO TABS: 20.0000 mg | ORAL_TABLET | Freq: Every day | ORAL | 3 refills | Status: DC

## 2024-01-29 MED ORDER — POTASSIUM CHLORIDE ER 10 MEQ PO TBCR: 10.0000 meq | EXTENDED_RELEASE_TABLET | Freq: Every day | ORAL | 3 refills | Status: DC

## 2024-01-29 NOTE — Progress Notes (Signed)
 Cardiology Office Note   Date:  01/29/2024  ID:  Amadea, Keagy 12/28/1933, MRN [id number] PCP: Dineen Rollene MATSU, FNP  [facility] Providers Cardiologist:  None     History of Present Illness HENNY STRAUCH is a 89 y.o. female with a past medical history of PACs, history of BPD, arthritis, degenerative joint disease, lumbar radiculitis, cataracts, new onset atrial fibrillation, who is here today for follow-up.   She previously been seen in clinic 09/06/2021 by Dr. Mady.  She was being evaluated for an irregular heartbeat with concern for atrial fibrillation at the request of her PCP.  She denied any palpitations as well as chest pain, shortness of breath, lightheadedness or edema.  She does not have a history of heart problems nor does she have any cardiovascular testing.  There was no evidence of atrial fibrillation noted on EKG since she was scheduled for lab work.  There was no further cardiac testing that was recommended and no need for anticoagulation she was to return to clinic on as-needed basis.   She was seen in clinic 01/15/2024 and recently being found to be in atrial fibrillation by her PCP.  She had recently been diagnosed with shingles and had quite a bit of nerve pain and was being treated at that time.  She continued to have complaints of fatigue, shortness of breath and peripheral edema.  Primary care provider had started her on apixaban  at that time.  She was scheduled for an echocardiogram and was started on Toprol -XL 12.5 mg daily.  She returns clinic today accompanied by her son.  She states that she has been having worsening peripheral edema to her bilateral lower extremities over the last several days.  Her son stated that her swelling started when she started apixaban  and is concerned that this is side effect from the medication.  There was sure that it is not a typical side effect of apixaban  but she is likely exhibiting heart failure symptoms from the atrial  fibrillation.  She recently had echocardiogram completed.  She stated she continues to the left flat to sleep at night and her appetite has been the same.  She states several nights ago she was unable to sleep due to the shortness of breath.  The legs have gotten worse.  Her son was concerned today because  he states that she has previously been told about 2 people that she needed to be treated in the emergency department.  She states that she has been compliant with her current medication regimen without any adverse side effects.  She has tolerated apixaban  without issue denies any bleeding in her stool or urine.  Denies any recent hospitalizations or visits to the emergency department.  ROS: 10 point review of system has been reviewed and considered negative except ones been listed in HPI  Studies Reviewed     2D echo 01/28/2024 1. Left ventricular ejection fraction, by estimation, is 40 to 45%. Left  ventricular ejection fraction by PLAX is 41 %. The left ventricle has mild  to moderately decreased function. The left ventricle demonstrates global  hypokinesis. There is moderate  left ventricular hypertrophy. Left ventricular diastolic parameters are  indeterminate.   2. Right ventricular systolic function is normal. The right ventricular  size is normal. There is moderately elevated pulmonary artery systolic  pressure. The estimated right ventricular systolic pressure is 58.0 mmHg.   3. Left atrial size was severely dilated.   4. Right atrial size was moderately dilated.  5. The mitral valve is normal in structure. Mild to moderate mitral valve  regurgitation.   6. Tricuspid valve regurgitation is mild to moderate.   7. The aortic valve is tricuspid. Aortic valve regurgitation is not  visualized.   8. The inferior vena cava is dilated in size with <50% respiratory  variability, suggesting right atrial pressure of 15 mmHg.   2D echo 01/09/2022 1. Left ventricular ejection fraction, by  estimation, is 55 to 60%. The  left ventricle has normal function. Left ventricular endocardial border  not optimally defined to evaluate regional wall motion. There is mild left  ventricular hypertrophy. Left  ventricular diastolic parameters are indeterminate.   2. Right ventricular systolic function is normal. The right ventricular  size is normal. There is mildly elevated pulmonary artery systolic  pressure.   3. Left atrial size was moderately dilated.   4. Right atrial size was mildly dilated.   5. The mitral valve is abnormal. Mild to moderate mitral valve  regurgitation. No evidence of mitral stenosis. There is mild late systolic  prolapse of both leaflets of the mitral valve.   6. Tricuspid valve regurgitation is mild to moderate.   7. The aortic valve is tricuspid. There is mild thickening of the aortic  valve. Aortic valve regurgitation is not visualized. Aortic valve  sclerosis is present, with no evidence of aortic valve stenosis.   8. The inferior vena cava is dilated in size with >50% respiratory  variability, suggesting right atrial pressure of 8 mmHg.   Risk Assessment/Calculations  CHA2DS2-VASc Score = 3   This indicates a 3.2% annual risk of stroke. The patient's score is based upon: CHF History: 0 HTN History: 0 Diabetes History: 0 Stroke History: 0 Vascular Disease History: 0 Age Score: 2 Gender Score: 1            Physical Exam VS:  BP 138/70 (BP Location: Left Arm, Patient Position: Sitting, Cuff Size: Normal)   Pulse 83   Ht 5' 3 (1.6 m)   Wt 158 lb (71.7 kg)   SpO2 96%   BMI 27.99 kg/m        Wt Readings from Last 3 Encounters:  01/29/24 158 lb (71.7 kg)  01/13/24 158 lb 9.6 oz (71.9 kg)  12/30/23 143 lb 3.2 oz (65 kg)    GEN: Well nourished, well developed in no acute distress NECK: No JVD; No carotid bruits CARDIAC: IR IR, no murmurs, rubs, gallops RESPIRATORY:  Clear to auscultation without rales, wheezing or rhonchi  ABDOMEN: Soft,  non-tender, non-distended EXTREMITIES:  2+ pitting edema to BLE from the knees to ankles; No deformity   ASSESSMENT AND PLAN New onset atrial fibrillation longstanding history of PACs.  Has not she has been continued on apixaban  5 mg twice daily for CHA2DS2-VASc score of at least 3 for stroke prophylaxis she is also continued on Toprol -XL 12.5 mg daily.  Recent echocardiogram completed with a newly reduced EF.  Echo revealed severely dilated left atrium.  Concerning for cardioversion procedure would be beneficial.  She is not favorable of being put to sleep and undergoing cardioversion.  Referral is being sent to EP for rhythm of versus rate controlling options versus potential ablation procedure later on.  Discussed several different antiarrhythmics today during her visit she is not keen on more medication and her son is full of questions.  Primary hypertension with blood pressure today 138/70.  Blood pressure has been well-controlled.  She is continued on Toprol -XL 12.5 mg daily.  She has been encouraged to continue to monitor her blood pressure at home 1 to 2 hours postmedication administration as well.  Peripheral edema that is 1-2+ pitting to the bilateral lower extremities from her knees to her ankles and has a weeping on the back of her left leg.  With recent atrial fibrillation and newfound cardiomyopathy on echocardiogram she started to exhibit some heart failure symptoms.  She is being started on Lasix 20 mg daily with 10 mill equivalents of potassium chloride  with a repeat BMP in 1 week to ensure potassium and kidney function remain stable.  She is also been encouraged to decrease her fluids, sodium, and elevate her extremities when possible.  Cardiomyopathy noted on recent echocardiogram with an LVEF of 40-45% which is a drop from prior studies, left ventricle demonstrates global hypokinesis and moderate left ventricular hypertrophy, there is moderately elevated pulmonary artery systolic  pressure with estimated right ventricular systolic pressure of 58 mmHg, the left atrial size is severely dilated and the right atrial size is moderately dilated, there is mild to moderate MR, mild to moderate TR. part could be induced from atrial fibrillation.  Will discuss further on return and continue to try to escalate GDMT as tolerated by kidney function and blood pressure after some fluid removal with diuretic therapy.       Dispo: Patient to return to clinic to see MD/APP in 4 weeks or sooner if needed for reevaluation  Signed, Chetan Mehring, NP

## 2024-01-29 NOTE — Patient Instructions (Signed)
 Medication Instructions:  Your physician recommends the following medication changes.  START TAKING: Lasix 20 mg daily Potassium 10 mg daily *If you need a refill on your cardiac medications before your next appointment, please call your pharmacy*  Lab Work: Your provider would like for you to return in 1 week to have the following labs drawn: BMP.   Please go to [facility] [address] ([facility]) [address], [place] [address] You do not need an appointment.  They are open from 8 am- 4:30 pm.  Lunch from 1:00 pm- 2:00 pm You do not need to be fasting.   You may also go to one of the following [facility]:  [address] [place], [place] [address] Phone: [phone] Lab hours: Mon-Fri 8 am- 5 pm    Lunch 12 pm- 1 pm  [address] [place],  [place]  [address]  [place]  Phone: [phone] Lab hours: 7 am- 4 pm Lunch 12 pm-1 pm   [address] [place],  [place]  [address]  [place]  Phone: [phone] Lab hours: Mon-Fri 8 am- 5 pm    Lunch 12 pm- 1 pm  If you have labs (blood work) drawn today and your tests are completely normal, you will receive your results only by: MyChart Message (if you have MyChart) OR A paper copy in the mail If you have any lab test that is abnormal or we need to change your treatment, we will call you to review the results.  Testing/Procedures: No test ordered today   Follow-Up: At [facility], you and your health needs are our priority.  As part of our continuing mission to provide you with exceptional heart care, our providers are all part of one team.  This team includes your primary Cardiologist (physician) and Advanced Practice Providers or APPs (Physician Assistants and Nurse Practitioners) who all work together to provide you with the care you need, when you need it.  Your next appointment:   4 week(s)  Provider:   Lonni Hanson, MD    Other Instructions Referral to EP

## 2024-02-06 ENCOUNTER — Encounter: Payer: Self-pay | Admitting: Dermatology

## 2024-02-06 NOTE — Telephone Encounter (Signed)
-----   Message from [place] sent at 12/22/2023  4:38 PM EDT ----- Diagnosis: R hand dorsum :       WELL DIFFERENTIATED SQUAMOUS CELL CARCINOMA, ACANTHOLYTIC (ADENOID) VARIANT,       DEEP MARGIN INVOLVED    Please call with diagnosis and determine where the patient would like to have Mohs surgery.  Explanation: This is a squamous cell skin cancer that has grown beyond the surface of the skin and is invading the second layer of the skin. It has the potential to spread beyond the skin and threaten your health, so I recommend treating it.  Treatment: Given the location and type of skin cancer, I recommend Mohs surgery. Mohs surgery involves cutting out the skin cancer and then checking under the microscope to ensure the whole skin cancer was removed. If any skin cancer remains, the surgeon will cut out more until it is fully removed. The cure rate is about 98-99%. Once the Mohs surgeon confirms the skin cancer is out, they will discuss the options to repair or heal the area. You must take it easy for about two weeks after surgery (no lifting over 10-15 lbs, avoid activity to get your heart rate and blood pressure up). It is done at another office outside of [place] ([place], [place], or [place]).

## 2024-02-06 NOTE — Addendum Note (Signed)
 Addended by: Adelaida Reindel R on: 02/06/2024 12:37 PM   Modules accepted: Orders

## 2024-02-06 NOTE — Telephone Encounter (Signed)
 Called pt to see if she was ready for us  to schedule her mohs appointment for [facility] of the R hand dorsum.  Patient said she had been busy with some previous appointments but she would like us  to schedule her for mohs.  Advised pt I would send referral to Dr. Corey and someone from that office would call to schedule patient for appointment.  Referral sent to Dr. Janalee

## 2024-02-26 ENCOUNTER — Ambulatory Visit: Attending: Internal Medicine | Admitting: Internal Medicine

## 2024-02-26 ENCOUNTER — Encounter: Payer: Self-pay | Admitting: Internal Medicine

## 2024-02-26 VITALS — BP 120/70 | HR 100 | Ht 63.0 in | Wt 144.4 lb

## 2024-02-26 DIAGNOSIS — Z79899 Other long term (current) drug therapy: Secondary | ICD-10-CM | POA: Diagnosis not present

## 2024-02-26 DIAGNOSIS — I4891 Unspecified atrial fibrillation: Secondary | ICD-10-CM | POA: Diagnosis not present

## 2024-02-26 DIAGNOSIS — I429 Cardiomyopathy, unspecified: Secondary | ICD-10-CM | POA: Diagnosis not present

## 2024-02-26 DIAGNOSIS — R030 Elevated blood-pressure reading, without diagnosis of hypertension: Secondary | ICD-10-CM | POA: Diagnosis not present

## 2024-02-26 DIAGNOSIS — R600 Localized edema: Secondary | ICD-10-CM

## 2024-02-26 DIAGNOSIS — I10 Essential (primary) hypertension: Secondary | ICD-10-CM | POA: Diagnosis not present

## 2024-02-26 MED ORDER — METOPROLOL SUCCINATE ER 25 MG PO TB24: 25.0000 mg | ORAL_TABLET | Freq: Every day | ORAL | 3 refills | Status: AC

## 2024-02-26 NOTE — Progress Notes (Unsigned)
 Cardiology Office Note:  .   Date:  02/27/2024  ID:  JOCEE KISSICK, DOB [birth year], MRN [id number] PCP: Dineen Rollene MATSU, FNP  [facility] Providers Cardiologist:  None     History of Present Illness: .   Elizabeth Fernandez is a 89 y.o. female recent onset of atrial fibrillation, PACs, DVT, arthritis, and cataracts, who presents for follow-up of atrial fibrillation.  She was last seen by Tylene Lunch, NP, in the late June for follow-up of her atrial fibrillation diagnosed a few weeks earlier.  At that time, she reported worsening lower extremity edema that began around the time that she had been started on apixaban .  She also endorsed some shortness of breath that made it hard for her to sleep a few nights earlier.  Preceding echo showed reduced LVEF of 40-45% with moderate pulmonary hypertension, mild-moderate MR, and moderate TR.  Cardioversion was recommended though Ms. Frisinger declined out of concerns for being put under anesthesia.  She was referred to electrophysiology and started on furosemide  20 mg daily.  She is scheduled to see Dr. Cindie next week.  Today, Ms. Osterhout reports that she has been feeling fairly well.  She continues to have some lower extremity edema though it is improving.  She denies chest pain, shortness of breath, orthopnea/PND, palpitations, lightheadedness, and bleeding.  She has not had any recent falls.  She does not monitor her blood pressure regularly at home.  ROS: See HPI  Studies Reviewed: SABRA   EKG Interpretation Date/Time:  Wednesday February 26 2024 13:37:14 EDT Ventricular Rate:  100 PR Interval:    QRS Duration:  64 QT Interval:  316 QTC Calculation: 407 R Axis:   -13  Text Interpretation: Atrial fibrillation Low voltage QRS Abnormal ECG When compared with ECG of 13-Jan-2024 10:53, Nonspecific T wave abnormality is no longer Present Confirmed by Treyvonne Tata, Lonni [phone]) on 02/26/2024 1:42:24 PM    TTE (01/28/2024): Normal LV size with moderate LVH.   LVEF 40-45% with global hypokinesis and indeterminate diastolic parameters.  Normal RV size and function.  Moderate pulmonary hypertension (RVSP 58 mmHg).  Severe left and moderate right atrial enlargement.  No pericardial effusion.  Mild-moderate MR and TR.  CVP 15 mmHg.  Risk Assessment/Calculations:    CHA2DS2-VASc Score = 4   This indicates a 4.8% annual risk of stroke. The patient's score is based upon: CHF History: 1 HTN History: 0 Diabetes History: 0 Stroke History: 0 Vascular Disease History: 0 Age Score: 2 Gender Score: 1            Physical Exam:   VS:  BP 120/70 (BP Location: Left Arm, Patient Position: Sitting, Cuff Size: Normal)   Pulse 100   Ht 5' 3 (1.6 m)   Wt 144 lb 6 oz (65.5 kg)   SpO2 97%   BMI 25.57 kg/m    Wt Readings from Last 3 Encounters:  02/26/24 144 lb 6 oz (65.5 kg)  01/29/24 158 lb (71.7 kg)  01/13/24 158 lb 9.6 oz (71.9 kg)    General:  NAD.  Accompanied by her son. Neck: No JVD or HJR. Lungs: Clear to auscultation bilaterally without wheezes or crackles. Heart: Irregularly irregular rhythm with 2/6 systolic murmur. Abdomen: Soft, nontender, nondistended. Extremities: Trace pretibial edema bilaterally.  ASSESSMENT AND PLAN: .    Persistent atrial fibrillation: Ms. Gunnoe remains in atrial fibrillation with marginal ventricular rate control.  She is asymptomatic and remains reluctant to proceed with cardioversion, which we discussed at length  today.  Given her cardiomyopathy, I think it would be appropriate to pursue cardioversion.  However, we have agreed to defer further conversations to her upcoming electrophysiology appointment with Dr. Cindie.  In the meantime, we will increase metoprolol  succinate to 25 mg daily.  Continue apixaban  5 mg twice daily.  Heart failure with mildly reduced ejection fraction: Recent echo showed LVEF of 40-45% with moderate pulmonary hypertension, elevated CVP, and mild to moderate MR/TR.  I suspect her  cardiomyopathy is most likely tachycardia mediated in the setting of her atrial fibrillation, though other etiologies including ischemic substrate cannot be excluded.  We discussed further workup strategies and rationale behind goal-directed medical therapy and have agreed to escalate metoprolol  succinate to 25 mg daily.  Continue furosemide  20 mg daily, as volume status has improved but not normalized.  I will check a BMP today to ensure stable renal function and potassium.  Consider adding additional GDMT at follow-up.  As above, I think she would benefit from restoration of sinus rhythm and follow-up echo 6 weeks later to see if her LVEF has improved.  Based on results, noninvasive ischemia evaluation versus catheterization would then need to be discussed.    Dispo: Keep scheduled appointment with Dr. Cindie next week.  Follow-up with us  in about 6 weeks  Signed, Lonni Hanson, MD

## 2024-02-26 NOTE — Patient Instructions (Signed)
 Medication Instructions:  Your physician recommends the following medication changes.  INCREASE: Metoprolol  to 25 mg by mouth daily    *If you need a refill on your cardiac medications before your next appointment, please call your pharmacy*  Lab Work: Your provider would like for you to have following labs drawn today BMP.     Testing/Procedures: No test ordered today   Follow-Up: At [facility], you and your health needs are our priority.  As part of our continuing mission to provide you with exceptional heart care, our providers are all part of one team.  This team includes your primary Cardiologist (physician) and Advanced Practice Providers or APPs (Physician Assistants and Nurse Practitioners) who all work together to provide you with the care you need, when you need it.  Your next appointment:   6 week(s)  Provider:   You may see Lonni Hanson, MD or one of the following Advanced Practice Providers on your designated Care Team:   Lonni Meager, NP Lesley Maffucci, PA-C Bernardino Bring, PA-C Cadence [place], PA-C Tylene Lunch, NP Barnie Hila, NP

## 2024-02-27 ENCOUNTER — Encounter: Payer: Self-pay | Admitting: Internal Medicine

## 2024-02-27 ENCOUNTER — Ambulatory Visit: Payer: Self-pay | Admitting: Internal Medicine

## 2024-02-27 LAB — BASIC METABOLIC PANEL WITH GFR
BUN/Creatinine Ratio: 28 (ref 12–28)
BUN: 26 mg/dL (ref 8–27)
CO2: 23 mmol/L (ref 20–29)
Calcium: 9.1 mg/dL (ref 8.7–10.3)
Chloride: 103 mmol/L (ref 96–106)
Creatinine, Ser: 0.94 mg/dL (ref 0.57–1.00)
Glucose: 95 mg/dL (ref 70–99)
Potassium: 4.6 mmol/L (ref 3.5–5.2)
Sodium: 144 mmol/L (ref 134–144)
eGFR: 58 mL/min/1.73 — ABNORMAL LOW (ref >59)

## 2024-03-03 ENCOUNTER — Encounter: Payer: Self-pay | Admitting: Dermatology

## 2024-03-03 NOTE — Progress Notes (Unsigned)
 Electrophysiology Office Note:    Date:  03/04/2024   ID:  Elizabeth Fernandez, DOB 04-15-1934, MRN [id number]  [facility] Cardiologist:  None  [facility] Electrophysiologist:  OLE ONEIDA HOLTS, MD   Referring MD: Gerard Frederick, NP   Chief Complaint: Atrial fibrillation  History of Present Illness:    Elizabeth Fernandez is an 89 year old woman who I am seeing today for an evaluation of atrial fibrillation at the request of Joen Fake, NP.  The patient has a history of PACs, arthritis, degenerative joint disease, cataracts, atrial fibrillation.  Her atrial fibrillation was diagnosed January 15, 2024.  That was in the setting of recent shingles diagnosis.  She was symptomatic with fatigue and shortness of breath.  She was started on Eliquis .  At the appointment with Joen on January 29, 2024 she reported worsening lower extremity edema.  Recently completed echocardiogram showed a reduced left ventricular function.  Cardioversion was offered but the patient declined. She is relatively asymptomatic with her atrial fibrillation although she does report a decreased sleep quality over the last couple of months.     Their past medical, social and family history was reviewed.   ROS:   Please see the history of present illness.    All other systems reviewed and are negative.  EKGs/Labs/Other Studies Reviewed:    The following studies were reviewed today:  January 28, 2024 echo EF 40 to 45% RV normal Dilated left and right atrium Mild to moderate MR Mild to moderate TR  February 26, 2024 EKG shows atrial fibrillation.  Ventricular rate 100 bpm.  Low amplitude QRS  November 10, 2014 EKG shows sinus rhythm.  PVC  November 07, 2017 EKG shows sinus rhythm.  PACs. EKG Interpretation Date/Time:  Wednesday March 04 2024 11:23:27 EDT Ventricular Rate:  89 PR Interval:    QRS Duration:  74 QT Interval:  326 QTC Calculation: 396 R Axis:   119  Text Interpretation: Atrial fibrillation Confirmed by HOLTS OLE [phone]) on 03/04/2024 11:26:30 AM    Physical Exam:    VS:  BP 136/75   Pulse 89   Ht 5' 3 (1.6 m)   Wt 143 lb (64.9 kg)   SpO2 96%   BMI 25.33 kg/m     Wt Readings from Last 3 Encounters:  03/04/24 143 lb (64.9 kg)  02/26/24 144 lb 6 oz (65.5 kg)  01/29/24 158 lb (71.7 kg)     GEN: no distress.  Elderly. CARD: Irregularly irregular, No MRG RESP: No IWOB. CTAB.        ASSESSMENT AND PLAN:    1. Persistent atrial fibrillation (HCC)   2. Chronic systolic heart failure (HCC)     #Persistent atrial fibrillation Was previously associated with lower extremity edema, fatigue, shortness of breath.  Now poor sleep quality seems to be the dominant symptom.  I have recommended cardioversion.  She has not missed any doses of Eliquis  over the last 4 weeks.  I discussed the cardioversion procedure in detail the patient including risks and she wishes to proceed.  Will have her follow-up with an APP in about 6 to 8 weeks.  Given baseline prolonged QTc, she would not be a candidate for Tikosyn in the future.  If antiarrhythmics were needed, favor amiodarone.  #Chronic systolic heart failure NYHA class II.  Warm and dry on exam.  Follow-up 6 to 8 weeks with APP.  Signed, OLE ONEIDA. HOLTS, MD, [facility], [facility] 03/04/2024 11:34 AM    Electrophysiology [facility]

## 2024-03-03 NOTE — H&P (View-Only) (Signed)
 Electrophysiology Office Note:    Date:  03/04/2024   ID:  Elizabeth Fernandez, DOB 05-15-1934, MRN [id number]  [facility] Cardiologist:  None  [facility] Electrophysiologist:  OLE ONEIDA HOLTS, MD   Referring MD: Gerard Frederick, NP   Chief Complaint: Atrial fibrillation  History of Present Illness:    Elizabeth Fernandez is an 89 year old woman who I am seeing today for an evaluation of atrial fibrillation at the request of Joen Fake, NP.  The patient has a history of PACs, arthritis, degenerative joint disease, cataracts, atrial fibrillation.  Her atrial fibrillation was diagnosed January 15, 2024.  That was in the setting of recent shingles diagnosis.  She was symptomatic with fatigue and shortness of breath.  She was started on Eliquis .  At the appointment with Joen on January 29, 2024 she reported worsening lower extremity edema.  Recently completed echocardiogram showed a reduced left ventricular function.  Cardioversion was offered but the patient declined. She is relatively asymptomatic with her atrial fibrillation although she does report a decreased sleep quality over the last couple of months.     Their past medical, social and family history was reviewed.   ROS:   Please see the history of present illness.    All other systems reviewed and are negative.  EKGs/Labs/Other Studies Reviewed:    The following studies were reviewed today:  January 28, 2024 echo EF 40 to 45% RV normal Dilated left and right atrium Mild to moderate MR Mild to moderate TR  February 26, 2024 EKG shows atrial fibrillation.  Ventricular rate 100 bpm.  Low amplitude QRS  November 10, 2014 EKG shows sinus rhythm.  PVC  November 07, 2017 EKG shows sinus rhythm.  PACs. EKG Interpretation Date/Time:  Wednesday March 04 2024 11:23:27 EDT Ventricular Rate:  89 PR Interval:    QRS Duration:  74 QT Interval:  326 QTC Calculation: 396 R Axis:   119  Text Interpretation: Atrial fibrillation Confirmed by HOLTS OLE [phone]) on 03/04/2024 11:26:30 AM    Physical Exam:    VS:  BP 136/75   Pulse 89   Ht 5' 3 (1.6 m)   Wt 143 lb (64.9 kg)   SpO2 96%   BMI 25.33 kg/m     Wt Readings from Last 3 Encounters:  03/04/24 143 lb (64.9 kg)  02/26/24 144 lb 6 oz (65.5 kg)  01/29/24 158 lb (71.7 kg)     GEN: no distress.  Elderly. CARD: Irregularly irregular, No MRG RESP: No IWOB. CTAB.        ASSESSMENT AND PLAN:    1. Persistent atrial fibrillation (HCC)   2. Chronic systolic heart failure (HCC)     #Persistent atrial fibrillation Was previously associated with lower extremity edema, fatigue, shortness of breath.  Now poor sleep quality seems to be the dominant symptom.  I have recommended cardioversion.  She has not missed any doses of Eliquis  over the last 4 weeks.  I discussed the cardioversion procedure in detail the patient including risks and she wishes to proceed.  Will have her follow-up with an APP in about 6 to 8 weeks.  Given baseline prolonged QTc, she would not be a candidate for Tikosyn in the future.  If antiarrhythmics were needed, favor amiodarone.  #Chronic systolic heart failure NYHA class II.  Warm and dry on exam.  Follow-up 6 to 8 weeks with APP.  Signed, OLE ONEIDA. HOLTS, MD, [facility], [facility] 03/04/2024 11:34 AM    Electrophysiology [facility]

## 2024-03-04 ENCOUNTER — Ambulatory Visit: Attending: Cardiology | Admitting: Cardiology

## 2024-03-04 ENCOUNTER — Other Ambulatory Visit: Payer: Self-pay

## 2024-03-04 ENCOUNTER — Ambulatory Visit: Admitting: Cardiology

## 2024-03-04 VITALS — BP 136/75 | HR 89 | Ht 63.0 in | Wt 143.0 lb

## 2024-03-04 DIAGNOSIS — I5022 Chronic systolic (congestive) heart failure: Secondary | ICD-10-CM

## 2024-03-04 DIAGNOSIS — I4819 Other persistent atrial fibrillation: Secondary | ICD-10-CM

## 2024-03-04 NOTE — Patient Instructions (Signed)
 Medication Instructions:  Your physician recommends that you continue on your current medications as directed. Please refer to the Current Medication list given to you today.  *If you need a refill on your cardiac medications before your next appointment, please call your pharmacy*  Lab Work: TODAY: BMET and CBC  Testing/Procedures: Cardioversion  Your physician has recommended that you have a Cardioversion (DCCV). Electrical Cardioversion uses a jolt of electricity to your heart either through paddles or wired patches attached to your chest. This is a controlled, usually prescheduled, procedure. Defibrillation is done under light anesthesia in the hospital, and you usually go home the day of the procedure. This is done to get your heart back into a normal rhythm. You are not awake for the procedure. Please see the instruction sheet given to you today.  Follow-Up: At [facility], you and your health needs are our priority.  As part of our continuing mission to provide you with exceptional heart care, our providers are all part of one team.  This team includes your primary Cardiologist (physician) and Advanced Practice Providers or APPs (Physician Assistants and Nurse Practitioners) who all work together to provide you with the care you need, when you need it.  Your next appointment:   6-8 weeks  Provider:   Suzann Riddle, NP

## 2024-03-05 ENCOUNTER — Telehealth: Payer: Self-pay | Admitting: Internal Medicine

## 2024-03-05 NOTE — Telephone Encounter (Signed)
 Pts son would like to know more details in regards to the procedure on 08/11.

## 2024-03-05 NOTE — Telephone Encounter (Signed)
 Spoke with patients son per release form. He wanted to know about the procedure scheduled for his mother and instructions. He wanted to know why Dr. Cindie is not performing the DCCV and reviewed we have certain providers that perform those procedures. He verbalized understanding and all questions were answered. No further needs at this time.

## 2024-03-10 ENCOUNTER — Ambulatory Visit: Admitting: Dermatology

## 2024-03-10 ENCOUNTER — Encounter: Payer: Self-pay | Admitting: Dermatology

## 2024-03-10 VITALS — BP 121/82 | HR 83 | Temp 97.8°F

## 2024-03-10 DIAGNOSIS — L579 Skin changes due to chronic exposure to nonionizing radiation, unspecified: Secondary | ICD-10-CM | POA: Diagnosis not present

## 2024-03-10 DIAGNOSIS — L814 Other melanin hyperpigmentation: Secondary | ICD-10-CM | POA: Diagnosis not present

## 2024-03-10 DIAGNOSIS — C44622 Squamous cell carcinoma of skin of right upper limb, including shoulder: Secondary | ICD-10-CM | POA: Diagnosis not present

## 2024-03-10 DIAGNOSIS — C4492 Squamous cell carcinoma of skin, unspecified: Secondary | ICD-10-CM

## 2024-03-10 MED ORDER — MUPIROCIN 2 % EX OINT: 1.0000 | TOPICAL_OINTMENT | Freq: Two times a day (BID) | CUTANEOUS | 0 refills | Status: AC

## 2024-03-10 NOTE — Progress Notes (Signed)
 Follow-Up Visit   Subjective  Elizabeth Fernandez is a 89 y.o. 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 Claudene.  Patient states that the lesion has been present since April 2025 and was raised and painful. No previous treatment.   The following portions of the chart were reviewed this encounter and updated as appropriate: medications, allergies, medical history  Review of Systems:  No other skin or systemic complaints except as noted in HPI or Assessment and Plan.  Objective  Well appearing patient in no apparent distress; mood and affect are within normal limits.  A focused examination was performed of the following areas: Right hand dorsum Relevant physical exam findings are noted in the Assessment and Plan.   Right hand dorsum Healing biopsy site   Assessment & Plan   SQUAMOUS CELL CARCINOMA OF SKIN Right hand dorsum Mohs surgery  Consent obtained: written  Anticoagulation: Is the patient taking prescription anticoagulant and/or aspirin  prescribed/recommended by a physician? Yes   Was the anticoagulation regimen changed prior to Mohs? No    Anesthesia: Anesthesia method: local infiltration Local anesthetic: lidocaine  1% WITH epi  Procedure Details: Timeout: pre-procedure verification complete Procedure Prep: patient was prepped and draped in usual sterile fashion Prep type: chlorhexidine  Biopsy accession number: [id number] Biopsy lab: [facility] Date of biopsy: 12/16/2023 Frozen section biopsy performed: No   Specimen debulked: No   Pre-Op diagnosis: squamous cell carcinoma SCC subtype: well differentiated (Acantholytic Variant) MohsAIQ Surgical site (if tumor spans multiple areas, please select predominant area): hand Surgery side: right Surgical site (from skin exam): Right hand dorsum Pre-operative length (cm): 0.6 Pre-operative width (cm): 0.6 Indications for  Mohs surgery: anatomic location where tissue conservation is critical Previously treated? No    Micrographic Surgery Details: Post-operative length (cm): 1.3 Post-operative width (cm): 1.3 Number of Mohs stages: 1 Post surgery depth of defect: subcutaneous fat  Stage 1    Tumor features identified on Mohs section: no tumor identified  Patient tolerance of procedure: tolerated well, no immediate complications  Reconstruction: Was the defect reconstructed?: No (Second Intention)    Antibiotics: Does patient meet AHA guidelines for endocarditis?: No   Does patient meet AHA guidelines for orthopedic prophylaxis?: No   Were antibiotics given on the day of surgery?: No   Did surgery breach mucosa, expose cartilage/bone, involve an area of lymphedema/inflamed/infected tissue? No   Indication for post-operative antibiotics: anatomic location  mupirocin  ointment (BACTROBAN ) 2 % Apply 1 Application topically 2 (two) times daily.   Return in about 4 weeks (around 04/07/2024) for wound check.  Elizabeth Rollene Gobble, RN, am acting as scribe for RUFUS CHRISTELLA HOLY, MD    03/10/2024  HISTORY OF PRESENT ILLNESS  Elizabeth Fernandez is seen in consultation at the request of Dr. Claudene for biopsy-proven Well Differentiated Squamous Cell Carcinoma of the right dorsal hand. They note that the area has been present for about 6 months increasing in size with time.  There is no history of previous treatment.  Reports no other new or changing lesions and has no other complaints today.  Medications and allergies: see patient chart.  Review of systems: Reviewed 8 systems and notable for the above skin cancer.  All other systems reviewed are unremarkable/negative, unless noted in the HPI. Past medical history, surgical history, family history, social history were also reviewed and are noted in the chart/questionnaire.    PHYSICAL EXAMINATION  General: Well-appearing,  in no acute distress, alert and oriented x 4.  Vitals reviewed in chart (if available).   Skin: Exam reveals a 0.6 x 0.6 cm erythematous papule and biopsy scar on the right dorsal hand. There are rhytids, telangiectasias, and lentigines, consistent with photodamage.  Biopsy report(s) reviewed, confirming the diagnosis.   ASSESSMENT  1) Well Differentiated Squamous Cell Carcinoma of right dorsal hand 2) photodamage 3) solar lentigines   PLAN   1. Due to location, size, histology, or recurrence and the likelihood of subclinical extension as well as the need to conserve normal surrounding tissue, the patient was deemed acceptable for Mohs micrographic surgery (MMS).  The nature and purpose of the procedure, associated benefits and risks including recurrence and scarring, possible complications such as pain, infection, and bleeding, and alternative methods of treatment if appropriate were discussed with the patient during consent. The lesion location was verified by the patient, by reviewing previous notes, pathology reports, and by photographs as well as angulation measurements if available.  Informed consent was reviewed and signed by the patient, and timeout was performed at 10:00 AM. See op note below.  2. For the photodamage and solar lentigines, sun protection discussed/information given on OTC sunscreens, and we recommend continued regular follow-up with primary dermatologist every 6 months or sooner for any growing, bleeding, or changing lesions. 3. Prognosis and future surveillance discussed. 4. Letter with treatment outcome sent to referring provider. 5. Pain acetaminophen /ibuprofen   MOHS MICROGRAPHIC SURGERY AND RECONSTRUCTION  Initial size:   0.6 x 0.6 cm Surgical defect/wound size: 1.3 x 1.3 cm Anesthesia:    0.33% lidocaine  with 1:200,000 epinephrine  EBL:    <5 mL Complications:  None Repair type:   Second Intention  Stages: 1  STAGE I: Anesthesia achieved with 0.5% lidocaine  with 1:200,000 epinephrine . ChloraPrep  applied. 1 section(s) excised using Mohs technique (this includes total peripheral and deep tissue margin excision and evaluation with frozen sections, excised and interpreted by the same physician). The tumor was first debulked and then excised with an approx. 2mm margin.  Hemostasis was achieved with electrocautery as needed.  The specimen was then oriented, subdivided/relaxed, inked, and processed using Mohs technique.    Frozen section analysis revealed a clear deep and peripheral margin.   Reconstruction  Patient was notified of results and repair options were discussed, including second intention healing. After reviewing the advantages and disadvantages of each, we agreed on second intention healing as appropriate.   The surgical site was then lightly scrubbed with sterile, saline-soaked gauze.  The area was bandaged using Vaseline ointment, non-adherent gauze, gauze pads, and tape to provide an adequate pressure dressing.   The patient tolerated the procedure well, was given detailed written and verbal wound care instructions, and was discharged in good condition.  The patient will follow-up in 4 weeks and as scheduled with primary dermatologist.  Documentation: I have reviewed the above documentation for accuracy and completeness, and I agree with the above.  RUFUS CHRISTELLA HOLY, MD

## 2024-03-10 NOTE — Patient Instructions (Signed)

## 2024-03-11 ENCOUNTER — Encounter: Payer: Self-pay | Admitting: Dermatology

## 2024-03-13 ENCOUNTER — Telehealth: Payer: Self-pay | Admitting: *Deleted

## 2024-03-13 NOTE — Telephone Encounter (Signed)
 Called to confirm/remind patient of their procedure.   Scheduled for: DCCV  [x]  Date 03/16/24  [x]  Time 07:30 am [x]  Arrival time 06:30 am [x]  Location [facility]   [x]  Designated Driver  [x]  Instructions, time, and location reviewed with patient    [x]  H&P within 30 days  [x]  EKG within 30 days  [x]  Orders  [x]  Labs  [x]  GFR N/A  [x]  Diet  [x]  Medication instructions reviewed   [x]  Spoke with patient and reviewed all information []  VM Full/unable to leave message  []  Phone not in service

## 2024-03-13 NOTE — Telephone Encounter (Signed)
Left voicemail message to call back for review of procedure instructions.

## 2024-03-13 NOTE — Telephone Encounter (Signed)
 Patient returned RN's call regarding procedure instructions.

## 2024-03-16 ENCOUNTER — Ambulatory Visit: Admitting: Certified Registered Nurse Anesthetist

## 2024-03-16 ENCOUNTER — Ambulatory Visit
Admission: RE | Admit: 2024-03-16 | Discharge: 2024-03-16 | Disposition: A | Discharge status: Home / Self Care | Attending: Cardiology | Admitting: Cardiology

## 2024-03-16 ENCOUNTER — Encounter: Payer: Self-pay | Admitting: Cardiology

## 2024-03-16 ENCOUNTER — Encounter
Admission: RE | Disposition: A | Discharge status: Home / Self Care | Payer: Self-pay | Source: Home / Self Care | Attending: Cardiology

## 2024-03-16 DIAGNOSIS — J449 Chronic obstructive pulmonary disease, unspecified: Secondary | ICD-10-CM | POA: Diagnosis not present

## 2024-03-16 DIAGNOSIS — M1990 Unspecified osteoarthritis, unspecified site: Secondary | ICD-10-CM | POA: Diagnosis not present

## 2024-03-16 DIAGNOSIS — I5022 Chronic systolic (congestive) heart failure: Secondary | ICD-10-CM | POA: Diagnosis not present

## 2024-03-16 DIAGNOSIS — I4891 Unspecified atrial fibrillation: Secondary | ICD-10-CM

## 2024-03-16 DIAGNOSIS — Z7901 Long term (current) use of anticoagulants: Secondary | ICD-10-CM | POA: Diagnosis not present

## 2024-03-16 DIAGNOSIS — I4819 Other persistent atrial fibrillation: Secondary | ICD-10-CM | POA: Diagnosis not present

## 2024-03-16 DIAGNOSIS — Z01818 Encounter for other preprocedural examination: Secondary | ICD-10-CM

## 2024-03-16 DIAGNOSIS — J45909 Unspecified asthma, uncomplicated: Secondary | ICD-10-CM | POA: Diagnosis not present

## 2024-03-16 HISTORY — PX: CARDIOVERSION: SHX1299

## 2024-03-16 SURGERY — CARDIOVERSION
Anesthesia: General

## 2024-03-16 MED ORDER — PROPOFOL 10 MG/ML IV BOLUS
INTRAVENOUS | Status: DC | PRN
  Administered 2024-03-16 (×2): 30 mg via INTRAVENOUS
  Administered 2024-03-16 (×2): 10 mg via INTRAVENOUS

## 2024-03-16 MED ORDER — SODIUM CHLORIDE 0.9 % IV SOLN: INTRAVENOUS | Status: DC

## 2024-03-16 MED ORDER — ACETAMINOPHEN 325 MG PO TABS
ORAL_TABLET | ORAL | Status: AC
  Filled 2024-03-16: qty 2

## 2024-03-16 MED ORDER — ACETAMINOPHEN 325 MG PO TABS
650.0000 mg | ORAL_TABLET | Freq: Once | ORAL | Status: AC
  Administered 2024-03-16 (×2): 650 mg via ORAL

## 2024-03-16 MED ORDER — PROPOFOL 10 MG/ML IV BOLUS
INTRAVENOUS | Status: AC
  Filled 2024-03-16: qty 20

## 2024-03-16 NOTE — Anesthesia Postprocedure Evaluation (Signed)
 Anesthesia Post Note  Patient: Elizabeth Fernandez  Procedure(s) Performed: CARDIOVERSION  Patient location during evaluation: Specials Recovery Anesthesia Type: General Level of consciousness: awake and alert Pain management: pain level controlled Vital Signs Assessment: post-procedure vital signs reviewed and stable Respiratory status: spontaneous breathing, nonlabored ventilation, respiratory function stable and patient connected to nasal cannula oxygen Cardiovascular status: blood pressure returned to baseline and stable Postop Assessment: no apparent nausea or vomiting Anesthetic complications: no   No notable events documented.   Last Vitals:  Vitals:   03/16/24 0830 03/16/24 0840  BP: 119/79 110/77  Pulse: 72 67  Resp: 18 18  Temp:    SpO2: 94% 92%    Last Pain:  Vitals:   03/16/24 0840  TempSrc:   PainSc: 4                  Fairy POUR Jiya Kissinger

## 2024-03-16 NOTE — Interval H&P Note (Signed)
 History and Physical Interval Note:  03/16/2024 7:56 AM  Elizabeth Fernandez  has presented today for surgery, with the diagnosis of persistent Afib.  The various methods of treatment have been discussed with the patient and family. After consideration of risks, benefits and other options for treatment, the patient has consented to  Procedure(s): CARDIOVERSION (N/A) as a surgical intervention.  The patient's history has been reviewed, patient examined, no change in status, stable for surgery.  I have reviewed the patient's chart and labs.  Questions were answered to the patient's satisfaction.     [name]

## 2024-03-16 NOTE — Procedures (Signed)
 Cardioversion procedure note For atrial fibrillation.  Procedure Details:  Consent: Risks of procedure as well as the alternatives and risks of each were explained to the (patient/caregiver).  Consent for procedure obtained.  Time Out: Verified patient identification, verified procedure, site/side was marked, verified correct patient position, special equipment/implants available, medications/allergies/relevent history reviewed, required imaging and test results available.  Performed  Patient placed on cardiac monitor, pulse oximetry, supplemental oxygen as necessary.   Sedation given: propofol  IV per anesthesia team. Pacer pads placed anterior and posterior chest.   Cardioverted 2 time(s).   Cardioverted at  150 and 200J. Synchronized biphasic  Unsuccessful cardioversion attempts. Sinus rhythm noted for approximately 10 seconds after cardioversion attempts.  Evaluation: Findings: Post procedure EKG shows: atrial fibrillation Complications: None Patient did tolerate procedure well.

## 2024-03-16 NOTE — Anesthesia Preprocedure Evaluation (Signed)
 Anesthesia Evaluation  Patient identified by MRN, date of birth, ID band Patient awake    Reviewed: Allergy & Precautions, NPO status , Patient's Chart, lab work & pertinent test results  History of Anesthesia Complications Negative for: history of anesthetic complications  Airway Mallampati: III  TM Distance: >3 FB Neck ROM: full    Dental  (+) Chipped, Partial Upper, Upper Dentures, Poor Dentition, Missing   Pulmonary shortness of breath and with exertion, COPD   Pulmonary exam normal        Cardiovascular Exercise Tolerance: Good (-) angina (-) Past MI + dysrhythmias Atrial Fibrillation  Rhythm:irregular Rate:Normal     Neuro/Psych negative neurological ROS  negative psych ROS   GI/Hepatic negative GI ROS, Neg liver ROS,,,  Endo/Other  Hypothyroidism    Renal/GU negative Renal ROS  negative genitourinary   Musculoskeletal   Abdominal   Peds  Hematology negative hematology ROS (+)   Anesthesia Other Findings Past Medical History: No date: Arthritis     Comment:  both knees 04/28/2021: BCC (basal cell carcinoma)     Comment:  left distal lateral deltoid, excised 02/20/2022 No date: Cataract     Comment:  Surgery scheduled for 03/2014 No date: DVT (deep venous thrombosis) (HCC)     Comment:  Left popliteal vein 12/16/2023: SCC (squamous cell carcinoma)     Comment:  right hand dorsum, referral sent to Dr. Corey  Past Surgical History: No date: CHOLECYSTECTOMY 11/10/2014: FEMUR SURGERY 11/09/2017: HIP ARTHROPLASTY; Right     Comment:  Procedure: ARTHROPLASTY BIPOLAR HIP (HEMIARTHROPLASTY);               Surgeon: Cleotilde Barrio, MD;  Location: [facility] ORS;                Service: Orthopedics;  Laterality: Right; 01/25/2017: IR FLUORO GUIDED NEEDLE PLC ASPIRATION/INJECTION LOC 12/2023: lesion from hand; Right  BMI    Body Mass Index: 25.51 kg/m      Reproductive/Obstetrics negative OB ROS                               Anesthesia Physical Anesthesia Plan  ASA: 3  Anesthesia Plan: General   Post-op Pain Management:    Induction: Intravenous  PONV Risk Score and Plan: Propofol  infusion and TIVA  Airway Management Planned: Natural Airway and Nasal Cannula  Additional Equipment:   Intra-op Plan:   Post-operative Plan:   Informed Consent: I have reviewed the patients History and Physical, chart, labs and discussed the procedure including the risks, benefits and alternatives for the proposed anesthesia with the patient or authorized representative who has indicated his/her understanding and acceptance.     Dental Advisory Given  Plan Discussed with: Anesthesiologist, CRNA and Surgeon  Anesthesia Plan Comments: (Patient consented for risks of anesthesia including but not limited to:  - adverse reactions to medications - risk of airway placement if required - damage to eyes, teeth, lips or other oral mucosa - nerve damage due to positioning  - sore throat or hoarseness - Damage to heart, brain, nerves, lungs, other parts of body or loss of life  Patient voiced understanding and assent.)        Anesthesia Quick Evaluation

## 2024-03-16 NOTE — Transfer of Care (Signed)
 Immediate Anesthesia Transfer of Care Note  Patient: Elizabeth Fernandez  Procedure(s) Performed: CARDIOVERSION  Patient Location: [facility]  Anesthesia Type:General  Level of Consciousness: sedated, drowsy, and patient cooperative  Airway & Oxygen Therapy: Patient Spontanous Breathing and Patient connected to nasal cannula oxygen  Post-op Assessment: Report given to RN and Post -op Vital signs reviewed and stable  Post vital signs: Reviewed and stable  Last Vitals:  Vitals Value Taken Time  BP 129/64 03/16/24 08:01  Temp    Pulse 67 03/16/24 08:01  Resp 19 03/16/24 08:01  SpO2 99 % 03/16/24 08:01  Vitals shown include unfiled device data.  Last Pain:  Vitals:   03/16/24 0702  TempSrc: Oral  PainSc: 0-No pain         Complications: No notable events documented.

## 2024-03-16 NOTE — Anesthesia Procedure Notes (Signed)
 Date/Time: 03/16/2024 7:48 AM  Performed by: Dominica Krabbe, CRNAPre-anesthesia Checklist: Patient identified, Emergency Drugs available, Suction available, Patient being monitored and Timeout performed Patient Re-evaluated:Patient Re-evaluated prior to induction Oxygen Delivery Method: Nasal cannula Preoxygenation: Pre-oxygenation with 100% oxygen Induction Type: IV induction

## 2024-03-17 ENCOUNTER — Encounter: Payer: Self-pay | Admitting: Cardiology

## 2024-03-17 ENCOUNTER — Other Ambulatory Visit: Payer: Self-pay | Admitting: Family

## 2024-03-17 DIAGNOSIS — E039 Hypothyroidism, unspecified: Secondary | ICD-10-CM

## 2024-03-31 ENCOUNTER — Other Ambulatory Visit: Payer: Self-pay | Admitting: Family

## 2024-03-31 DIAGNOSIS — I4891 Unspecified atrial fibrillation: Secondary | ICD-10-CM

## 2024-04-07 ENCOUNTER — Ambulatory Visit (INDEPENDENT_AMBULATORY_CARE_PROVIDER_SITE_OTHER): Admitting: Dermatology

## 2024-04-07 ENCOUNTER — Encounter: Payer: Self-pay | Admitting: Dermatology

## 2024-04-07 DIAGNOSIS — C4492 Squamous cell carcinoma of skin, unspecified: Secondary | ICD-10-CM

## 2024-04-07 DIAGNOSIS — Z85828 Personal history of other malignant neoplasm of skin: Secondary | ICD-10-CM

## 2024-04-07 DIAGNOSIS — S61401D Unspecified open wound of right hand, subsequent encounter: Secondary | ICD-10-CM | POA: Diagnosis not present

## 2024-04-07 DIAGNOSIS — T1490XD Injury, unspecified, subsequent encounter: Secondary | ICD-10-CM

## 2024-04-07 NOTE — Progress Notes (Signed)
   Follow Up Visit   Subjective  EDYN POPOCA is a 89 y.o. female who presents for the following: follow up from Mohs surgery   The patient presents for follow up from Mohs surgery for a SCC on the right dorsum of hand, treated on 03/10/24, repaired with 2nd intention. The patient has been bandaging the wound as directed. The endorse the following concerns: none  The following portions of the chart were reviewed this encounter and updated as appropriate: medications, allergies, medical history  Review of Systems:  No other skin or systemic complaints except as noted in HPI or Assessment and Plan.  Objective  Well appearing patient in no apparent distress; mood and affect are within normal limits.  A focal examination was performed including scalp, head, face and right hand. All findings within normal limits unless otherwise noted below.  Healing wound with mild erythema  Relevant physical exam findings are noted in the Assessment and Plan.    Assessment & Plan   Healing Wound s/p Mohs for SCC, treated on 03/10/24, repaired with 2nd intention - Reassured that wound is healing well - No evidence of infection - No swelling, induration, purulence, dehiscence, or tenderness out of proportion to the clinical exam, see photo above - Discussed that scars take up to 12 months to mature from the date of surgery - Recommend SPF 30+ to scar daily to prevent purple color from UV exposure during scar maturation process - Discussed that erythema and raised appearance of scar will fade over the next 4-6 months - OK to start scar massage at 4-6 weeks post-op - Can consider silicone based products for scar healing starting at 6 weeks post-op - Ok to continue ointment daily to wound under a bandage for another week  HISTORY OF SQUAMOUS CELL CARCINOMA OF THE SKIN - No evidence of recurrence today - Recommend regular full body skin exams - Recommend daily broad spectrum sunscreen SPF 30+ to sun-exposed  areas, reapply every 2 hours as needed.  - Call if any new or changing lesions are noted between office visits  Return if symptoms worsen or fail to improve.  I, Darice Smock, CMA, am acting as scribe for RUFUS CHRISTELLA HOLY, MD.   Documentation: I have reviewed the above documentation for accuracy and completeness, and I agree with the above.  RUFUS CHRISTELLA HOLY, MD

## 2024-04-07 NOTE — Patient Instructions (Signed)

## 2024-04-08 ENCOUNTER — Ambulatory Visit: Attending: Cardiology | Admitting: Cardiology

## 2024-04-08 ENCOUNTER — Encounter: Payer: Self-pay | Admitting: Cardiology

## 2024-04-08 VITALS — BP 118/60 | HR 77 | Ht 63.0 in | Wt 143.6 lb

## 2024-04-08 DIAGNOSIS — R600 Localized edema: Secondary | ICD-10-CM | POA: Diagnosis not present

## 2024-04-08 DIAGNOSIS — I10 Essential (primary) hypertension: Secondary | ICD-10-CM

## 2024-04-08 DIAGNOSIS — I5022 Chronic systolic (congestive) heart failure: Secondary | ICD-10-CM | POA: Diagnosis not present

## 2024-04-08 DIAGNOSIS — I4819 Other persistent atrial fibrillation: Secondary | ICD-10-CM

## 2024-04-08 MED ORDER — FUROSEMIDE 20 MG PO TABS: 20.0000 mg | ORAL_TABLET | Freq: Every day | ORAL | 3 refills | Status: AC

## 2024-04-08 MED ORDER — POTASSIUM CHLORIDE ER 10 MEQ PO TBCR: 10.0000 meq | EXTENDED_RELEASE_TABLET | Freq: Every day | ORAL | 3 refills | Status: AC

## 2024-04-08 NOTE — Progress Notes (Signed)
 Cardiology Office Note   Date:  04/08/2024  ID:  Elizabeth Fernandez, DOB November 06, 1933, MRN [id number] PCP: Dineen Rollene MATSU, FNP  [facility] Providers Cardiologist:  Lonni Hanson, MD Cardiology APP:  Gerard Frederick, NP  Electrophysiologist:  OLE ONEIDA HOLTS, MD     History of Present Illness Elizabeth Fernandez is a 89 y.o. female with a past medical history of PVCs, history of DVT, arthritis, new onset atrial fibrillation, who presents today for follow-up.   She previously been seen in clinic 09/06/2021 by Dr. Hanson.  She was being evaluated for an irregular heartbeat with concern for atrial fibrillation at the request of her PCP.  She denied any palpitations as well as chest pain, shortness of breath, lightheadedness or edema.  She does not have a history of heart problems nor does she have any cardiovascular testing.  There was no evidence of atrial fibrillation noted on EKG since she was scheduled for lab work.  There was no further cardiac testing that was recommended and no need for anticoagulation she was to return to clinic on as-needed basis.  She was seen in clinic 01/15/2024 due to recently and found to be in atrial fibrillation by her PCP.  She also recently been diagnosed with shingles and has quite a bit of nerve pain that was being treated at that house.  She continued to have complaints of fatigue, shortness of breath, peripheral edema.  Primary care provider started on apixaban .  She was scheduled for an echocardiogram was started on Toprol -XL 12.5 mg daily.  She was seen in clinic 01/29/2024 accompanied by her son.  States she had been having worsening peripheral edema to her bilateral lower extremities over the last 7 days.  Son stated that her swelling started when she started apixaban  for concerns of side effect with the medication.  She recently had her echocardiogram that was completed.  States that she will continue to live flat to sleep at night and appetite been the same.   Other than swelling she has not had any adverse side effects to any of her medication and been compliant with her medication without any missed doses.  She was continued on escalating GDMT did not do any found cardiomyopathy on echocardiogram.  Discussed cardioversion procedure with new onset atrial fibrillation when she was not febrile but asleep and undergoing cardioversion.  Referral was then placed to EP.  She was seen in clinic 02/23/2024 by Dr. Hanson.  She reports she been doing fairly well.  She continued to have some lower extremity edema but thought it was improving.  Denied any other associated symptoms.  He has been compliant with her blood thinner with no missed doses.  Her metoprolol  was increased to 25 mg twice daily.  Was to keep her scheduled follow-up appointment with EP to have a repeat echocardiogram to follow-up 6 weeks after cardioversion has been completed to see if she had improvement in her LVEF.  There were no other medication changes that were made or further testing that was ordered at the time.   She was last seen in clinic 02/23/2024 by Dr. HOLTS.  At that time she was symptomatic with fatigue and shortness of breath.  Was started on apixaban .  Recent completed echocardiogram showed reduced LVEF and cardioversion was offered but patient declined.  She is relatively asymptomatic with her atrial fibrillation after she did not report decrease in quality over the last couple of months.  Symptomatic.  Recommended cardioversion which was scheduled.  Given her baseline prolonged QTc she would not be a candidate for Tikosyn in the future.  If antiarrhythmics were needed would favor amiodarone.  She underwent cardioversion to symptoms which shocked 150 to 200 J.  Unfortunately was unsuccessful cardioversion attempt sinus rhythm was noted for approximately 10 seconds after cardioversion attempts were made.  Postprocedure EKG showed atrial fibrillation.  There were no immediate complications from  the procedure and she was able to be discharged home.  She returns to clinic today stating that she has been doing well.  Her only complaints today are some mild edema to her bilateral lower extremities and lack of sleep.  She states that she has not been napping during the day but unfortunately she is still not sleeping well at night.  States that she has been compliant with her current medication regimen.  Denies any missed doses of the apixaban .  She also denies any bleeding with no blood noted in her urine or stool.  ROS: 10 point review of systems has been reviewed and considered negative except what is listed in the HPI  Studies Reviewed EKG Interpretation Date/Time:  Wednesday April 08 2024 10:46:13 EDT Ventricular Rate:  77 PR Interval:    QRS Duration:  70 QT Interval:  348 QTC Calculation: 393 R Axis:   -20  Text Interpretation: Atrial fibrillation Anteroseptal infarct , age undetermined When compared with ECG of 16-Mar-2024 07:54, PREVIOUS ECG IS PRESENT Confirmed by Gerard Frederick ([id number]) on 04/08/2024 10:49:58 AM    2D echo 01/28/2024 1. Left ventricular ejection fraction, by estimation, is 40 to 45%. Left  ventricular ejection fraction by PLAX is 41 %. The left ventricle has mild  to moderately decreased function. The left ventricle demonstrates global  hypokinesis. There is moderate  left ventricular hypertrophy. Left ventricular diastolic parameters are  indeterminate.   2. Right ventricular systolic function is normal. The right ventricular  size is normal. There is moderately elevated pulmonary artery systolic  pressure. The estimated right ventricular systolic pressure is 58.0 mmHg.   3. Left atrial size was severely dilated.   4. Right atrial size was moderately dilated.   5. The mitral valve is normal in structure. Mild to moderate mitral valve  regurgitation.   6. Tricuspid valve regurgitation is mild to moderate.   7. The aortic valve is tricuspid. Aortic  valve regurgitation is not  visualized.   8. The inferior vena cava is dilated in size with <50% respiratory  variability, suggesting right atrial pressure of 15 mmHg.    2D echo 01/09/2022 1. Left ventricular ejection fraction, by estimation, is 55 to 60%. The  left ventricle has normal function. Left ventricular endocardial border  not optimally defined to evaluate regional wall motion. There is mild left  ventricular hypertrophy. Left  ventricular diastolic parameters are indeterminate.   2. Right ventricular systolic function is normal. The right ventricular  size is normal. There is mildly elevated pulmonary artery systolic  pressure.   3. Left atrial size was moderately dilated.   4. Right atrial size was mildly dilated.   5. The mitral valve is abnormal. Mild to moderate mitral valve  regurgitation. No evidence of mitral stenosis. There is mild late systolic  prolapse of both leaflets of the mitral valve.   6. Tricuspid valve regurgitation is mild to moderate.   7. The aortic valve is tricuspid. There is mild thickening of the aortic  valve. Aortic valve regurgitation is not visualized. Aortic valve  sclerosis is present, with no  evidence of aortic valve stenosis.   8. The inferior vena cava is dilated in size with >50% respiratory  variability, suggesting right atrial pressure of 8 mmHg.  Risk Assessment/Calculations  CHA2DS2-VASc Score = 4   This indicates a 4.8% annual risk of stroke. The patient's score is based upon: CHF History: 1 HTN History: 0 Diabetes History: 0 Stroke History: 0 Vascular Disease History: 0 Age Score: 2 Gender Score: 1            Physical Exam VS:  BP 118/60 (BP Location: Left Arm, Patient Position: Sitting, Cuff Size: Normal)   Pulse 77   Ht 5' 3 (1.6 m)   Wt 143 lb 9.6 oz (65.1 kg)   SpO2 98%   BMI 25.44 kg/m        Wt Readings from Last 3 Encounters:  04/08/24 143 lb 9.6 oz (65.1 kg)  03/16/24 144 lb (65.3 kg)  03/04/24 143 lb  (64.9 kg)    GEN: Well nourished, well developed in no acute distress NECK: No JVD; No carotid bruits CARDIAC: IR IR, no murmurs, rubs, gallops RESPIRATORY:  Clear to auscultation without rales, wheezing or rhonchi  ABDOMEN: Soft, non-tender, non-distended EXTREMITIES:  1+ edema BLE; No deformity   ASSESSMENT AND PLAN Persistent atrial fibrillation status post direct-current cardioversion procedure that was unsuccessful.  She maintained sinus rhythm for approximately 10 seconds postprocedure.  Today she states she is without symptoms of decompensation.  EKG today reveals atrial fibrillation that is rate controlled.  She has been continued on apixaban  5 mg twice daily for CHA2DS2-VASc score of at least 4 for stroke prophylaxis and Toprol -XL 25 mg twice daily.  Primary hypertension with a blood pressure today 118/60.  Blood pressure has been well-controlled.  She is continue Toprol -XL 25 mg daily.  She has been encouraged to monitor pressure 1 to 2 hours postmedication administration as well.  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  At this time she is in see any signs of decompensation with her shortness of breath she has some mild lower extremity edema which she has been continued on furosemide  20 mg daily.  She has also been continued on metoprolol  succinate 25 mg daily.  Discussed some medication changes which patient declines at this time stating that she feels better than she has in quite some time.  Will reevaluate and escalate GDMT on return.  Peripheral edema which has improved.  It is predominantly around her ankles.  She is continuing furosemide  20 mg daily with potassium.  Kidney function has remained stable.  Will continue to monitor with surveillance labs       Dispo: Patient to return to clinic  to see MD/APP in 4 months or sooner if needed for reevaluation.  Signed, Merilee Wible, NP

## 2024-04-08 NOTE — Patient Instructions (Signed)
 Medication Instructions:  Your physician recommends that you continue on your current medications as directed. Please refer to the Current Medication list given to you today.   *If you need a refill on your cardiac medications before your next appointment, please call your pharmacy*  Lab Work: No labs ordered today  If you have labs (blood work) drawn today and your tests are completely normal, you will receive your results only by: MyChart Message (if you have MyChart) OR A paper copy in the mail If you have any lab test that is abnormal or we need to change your treatment, we will call you to review the results.  Testing/Procedures: No test ordered today   Follow-Up: At [facility], you and your health needs are our priority.  As part of our continuing mission to provide you with exceptional heart care, our providers are all part of one team.  This team includes your primary Cardiologist (physician) and Advanced Practice Providers or APPs (Physician Assistants and Nurse Practitioners) who all work together to provide you with the care you need, when you need it.  Your next appointment:   4 month(s)  Move EP appointment out 2 months  Provider:   You may see Lonni Hanson, MD or one of the following Advanced Practice Providers on your designated Care Team:   Lonni Meager, NP Lesley Maffucci, PA-C Bernardino Bring, PA-C Cadence [place], PA-C Tylene Lunch, NP Barnie Hila, NP

## 2024-04-21 ENCOUNTER — Ambulatory Visit: Admitting: Cardiology

## 2024-04-22 ENCOUNTER — Other Ambulatory Visit: Payer: Self-pay | Admitting: Family

## 2024-04-23 NOTE — Telephone Encounter (Signed)
 Spoke to pt she does have a case of Thrush, she would like to knof if you can send in she doesn't want to be without it over the weekend, she did schedule earliest appt with you on 04/30/24, I offered appt with another provider sooner but she declined

## 2024-04-28 NOTE — Telephone Encounter (Unsigned)
 Copied from CRM #[id number]. Topic: General - Call Back - No Documentation >> Apr 28, 2024  9:40 AM Rea BROCKS wrote: Reason for CRM: Patient is calling in wanting to know if she still needs to come in for appointment this Thursday. She said she received the medication and is starting to feel better. Patient would like a call back to know.   [phone] (M)

## 2024-04-29 NOTE — Telephone Encounter (Signed)
 Left message for patient to give our office to discuss Arnett's recommendations.   OK for E2C2 to give result note and schedule pt for 6 month follow up. If relayed, please notify the office.

## 2024-04-30 ENCOUNTER — Ambulatory Visit: Admitting: Family

## 2024-05-04 ENCOUNTER — Encounter: Payer: Self-pay | Admitting: Cardiology

## 2024-05-04 ENCOUNTER — Ambulatory Visit: Attending: Cardiology | Admitting: Cardiology

## 2024-05-04 VITALS — BP 132/82 | HR 78 | Ht 63.0 in | Wt 142.4 lb

## 2024-05-04 DIAGNOSIS — I5022 Chronic systolic (congestive) heart failure: Secondary | ICD-10-CM

## 2024-05-04 DIAGNOSIS — D6869 Other thrombophilia: Secondary | ICD-10-CM | POA: Diagnosis not present

## 2024-05-04 DIAGNOSIS — I4819 Other persistent atrial fibrillation: Secondary | ICD-10-CM

## 2024-05-04 NOTE — Progress Notes (Signed)
 Electrophysiology Clinic Note    Date:  05/04/2024  Patient ID:  Elizabeth, Fernandez January 28, 1934, MRN [id number] PCP:  Dineen Rollene MATSU, FNP  Cardiologist:  Lonni Hanson, MD  Cardiology APP:  Gerard Frederick, NP  Electrophysiologist:  OLE ONEIDA HOLTS, MD     Discussed the use of AI scribe software for clinical note transcription with the patient, who gave verbal consent to proceed.   Patient Profile    Chief Complaint: Afib follow-up  History of Present Illness: Elizabeth Fernandez is a 89 y.o. female with PMH notable for persis Afib, PACs, HFmrEF; seen today for OLE ONEIDA HOLTS, MD for routine electrophysiology followup.   She last saw Dr. HOLTS 02/2024, AFib diagnosed earlier that month. She was in AFib during the appointment where she was having fatigue and SOB and worsening lower extremity edema.  She was set up for DCCV with IRAF. She saw NP Hammock earlier this month where she felt better than she had in a long time, declined GDMT medication adjustments.   On follow-up today, she overall feels better than she did when she saw Dr. HOLTS. She no longer has profound fatigue, is able to work in her garden without limitations - except running inside to use bathroom. She continues to take lasix  daily and questions whether she can stop it. Legs are less swollen, but continue to have sock indentations. Continues to take eliquis  BID, no bleeding concerns.   She denies chest pain, chest pressure, palpitations, SOB with activity     Arrhythmia/Device History No specialty comments available.    ROS:  Please see the history of present illness. All other systems are reviewed and otherwise negative.    Physical Exam    VS:  BP 132/82 (BP Location: Left Arm, Patient Position: Sitting, Cuff Size: Normal)   Pulse 78   Ht 5' 3 (1.6 m)   Wt 142 lb 6.4 oz (64.6 kg)   SpO2 96%   BMI 25.23 kg/m  BMI: Body mass index is 25.23 kg/m.           Wt Readings from Last 3  Encounters:  05/04/24 142 lb 6.4 oz (64.6 kg)  04/08/24 143 lb 9.6 oz (65.1 kg)  03/16/24 144 lb (65.3 kg)     GEN- The patient is well appearing, alert and oriented x 3 today.   Lungs- Clear to ausculation bilaterally, normal work of breathing.  Heart- Irregularly irregular rate and rhythm, no murmurs, rubs or gallops Extremities- 1+ peripheral edema, warm, dry   Studies Reviewed   Previous EP, cardiology notes.    EKG is not ordered. Personal review of EKG from 04/08/2024 shows:  Afib at 77bpm        TTE, 01/28/2024  1. Left ventricular ejection fraction, by estimation, is 40 to 45%. Left ventricular ejection fraction by PLAX is 41 %. The left ventricle has mild  to moderately decreased function. The left ventricle demonstrates global  hypokinesis. There is moderate left ventricular hypertrophy. Left ventricular diastolic parameters are indeterminate.   2. Right ventricular systolic function is normal. The right ventricular size is normal. There is moderately elevated pulmonary artery systolic pressure. The estimated right ventricular systolic pressure is 58.0 mmHg.   3. Left atrial size was severely dilated.   4. Right atrial size was moderately dilated.   5. The mitral valve is normal in structure. Mild to moderate mitral valve regurgitation.   6. Tricuspid valve regurgitation is mild to moderate.   7.  The aortic valve is tricuspid. Aortic valve regurgitation is not visualized.   8. The inferior vena cava is dilated in size with <50% respiratory variability, suggesting right atrial pressure of 15 mmHg.   TTE, 01/09/2022  1. Left ventricular ejection fraction, by estimation, is 55 to 60%. The left ventricle has normal function. Left ventricular endocardial border not optimally defined to evaluate regional wall motion. There is mild left  ventricular hypertrophy. Left ventricular diastolic parameters are indeterminate.   2. Right ventricular systolic function is normal. The right  ventricular size is normal. There is mildly elevated pulmonary artery systolic pressure.   3. Left atrial size was moderately dilated.   4. Right atrial size was mildly dilated.   5. The mitral valve is abnormal. Mild to moderate mitral valve regurgitation. No evidence of mitral stenosis. There is mild late systolic  prolapse of both leaflets of the mitral valve.   6. Tricuspid valve regurgitation is mild to moderate.   7. The aortic valve is tricuspid. There is mild thickening of the aortic valve. Aortic valve regurgitation is not visualized. Aortic valve sclerosis is present, with no evidence of aortic valve stenosis.   8. The inferior vena cava is dilated in size with >50% respiratory  variability, suggesting right atrial pressure of 8 mmHg.      Assessment and Plan     #) persis AFib S/p DCCV with IRAF We had long discussion regarding whether to continue to pursue rhythm mgmt vs rate control. With patient's reduced LVEF, I recommended initiating amiodarone +/- redo DCCV if does not convert to sinus rhythm with medication alone.  She is hesitant to start a new medication and requests time to research amio, which is reasonable.  I've asked her to notify office either way she decides Continue 25mg  toprol  daily   #) Hypercoag d/t persis afib CHA2DS2-VASc Score = at least 4 [CHF History: 1, HTN History: 0, Diabetes History: 0, Stroke History: 0, Vascular Disease History: 0, Age Score: 2, Gender Score: 1].  Therefore, the patient's annual risk of stroke is 4.8 %.    Stroke ppx - 5mg  eliquis  BID, appropriately dosed No bleeding concerns  #) HFmrEF Most recent TTE with mid-range LVEF 40-45% Warm on exam, lungs CTA with +1 lower extremity edema Continue 20mg  lasix  daily + 10meq potassium Continue 25mg  toprol  daily       Current medicines are reviewed at length with the patient today.   The patient has concerns regarding her medicines.  The following changes were made today:   none  Labs/ tests ordered today include:  No orders of the defined types were placed in this encounter.    Disposition: Follow up with Dr. Cindie or EP APP PRN based on whether she is agreeable to starting amiodarone    Signed, Kelsi Benham, NP  05/04/24  3:54 PM  Electrophysiology [facility]

## 2024-05-04 NOTE — Patient Instructions (Addendum)
 Medication Instructions:   Your physician recommends that you continue on your current medications as directed. Please refer to the Current Medication list given to you today.   Amiodarone Information Attached  *If you need a refill on your cardiac medications before your next appointment, please call your pharmacy*  Lab Work:  No labs ordered today   If you have labs (blood work) drawn today and your tests are completely normal, you will receive your results only by: MyChart Message (if you have MyChart) OR A paper copy in the mail If you have any lab test that is abnormal or we need to change your treatment, we will call you to review the results.  Testing/Procedures: No test ordered today   Follow-Up: At [facility], you and your health needs are our priority.  As part of our continuing mission to provide you with exceptional heart care, our providers are all part of one team.  This team includes your primary Cardiologist (physician) and Advanced Practice Providers or APPs (Physician Assistants and Nurse Practitioners) who all work together to provide you with the care you need, when you need it.  Your next appointment:    3-4 Months  Provider:    Tylene Lunch, NP   We recommend signing up for the patient portal called MyChart.  Sign up information is provided on this After Visit Summary.  MyChart is used to connect with patients for Virtual Visits (Telemedicine).  Patients are able to view lab/test results, encounter notes, upcoming appointments, etc.  Non-urgent messages can be sent to your provider as well.   To learn more about what you can do with MyChart, go to [email or web address].   Other Instructions:

## 2024-05-18 ENCOUNTER — Telehealth: Payer: Self-pay | Admitting: *Deleted

## 2024-05-18 NOTE — Telephone Encounter (Signed)
 Thanks for update.  I would recommend she continue to follow-up with general cardiology. No need to follow-up with EP for her rate-controlled AFib.

## 2024-05-18 NOTE — Telephone Encounter (Signed)
 Called to discuss the Amiodarone with the patient. She has decided not to take it at this time.

## 2024-05-18 NOTE — Telephone Encounter (Signed)
 Noted-recall in for 07/2024

## 2024-05-18 NOTE — Telephone Encounter (Signed)
-----   Message from Suzann Riddle sent at 05/18/2024  8:18 AM EDT ----- Regarding: Amiodarone Hello,  I discussed amiodarone with this patient at our last visit - she was going to think about it and let me know. I sent her a mychart message last week to follow-up with no response.  Please call her to follow-up   Thanks, Suzann

## 2024-06-01 ENCOUNTER — Ambulatory Visit

## 2024-07-06 ENCOUNTER — Encounter: Payer: Self-pay | Admitting: Family

## 2024-07-06 ENCOUNTER — Other Ambulatory Visit: Payer: Self-pay

## 2024-07-06 DIAGNOSIS — I4891 Unspecified atrial fibrillation: Secondary | ICD-10-CM

## 2024-07-06 MED ORDER — APIXABAN 5 MG PO TABS: ORAL_TABLET | ORAL | 2 refills | Status: DC

## 2024-07-14 ENCOUNTER — Ambulatory Visit: Admitting: Dermatology

## 2024-07-14 ENCOUNTER — Encounter: Payer: Self-pay | Admitting: Dermatology

## 2024-07-14 DIAGNOSIS — L304 Erythema intertrigo: Secondary | ICD-10-CM

## 2024-07-14 DIAGNOSIS — L570 Actinic keratosis: Secondary | ICD-10-CM

## 2024-07-14 DIAGNOSIS — Z85828 Personal history of other malignant neoplasm of skin: Secondary | ICD-10-CM | POA: Diagnosis not present

## 2024-07-14 DIAGNOSIS — L209 Atopic dermatitis, unspecified: Secondary | ICD-10-CM

## 2024-07-14 DIAGNOSIS — W908XXA Exposure to other nonionizing radiation, initial encounter: Secondary | ICD-10-CM | POA: Diagnosis not present

## 2024-07-14 DIAGNOSIS — L853 Xerosis cutis: Secondary | ICD-10-CM

## 2024-07-14 MED ORDER — KETOCONAZOLE 2 % EX CREA: TOPICAL_CREAM | CUTANEOUS | 5 refills | Status: AC

## 2024-07-14 MED ORDER — HYDROCORTISONE 2.5 % EX CREA: TOPICAL_CREAM | CUTANEOUS | 2 refills | Status: AC

## 2024-07-14 NOTE — Patient Instructions (Addendum)

## 2024-07-14 NOTE — Progress Notes (Signed)
 Follow-Up Visit   Subjective  Elizabeth Fernandez is a 89 y.o. female who presents for the following: Patient reports some places at inframammary that are itching and burning. Patient reports some itching also at left flank (not itching today) reports today is a good day, no rash present today.    The following portions of the chart were reviewed this encounter and updated as appropriate: medications, allergies, medical history  Review of Systems:  No other skin or systemic complaints except as noted in HPI or Assessment and Plan.  Objective  Well appearing patient in no apparent distress; mood and affect are within normal limits.  A focused examination was performed of the following areas: Inframammary, right dorsal hand  Relevant exam findings are noted in the Assessment and Plan.  Proximal to R second MCP Pink scaly macules  Assessment & Plan   INTERTRIGO Exam: Erythematous macerated patches in inframammary folds  Chronic and persistent condition with duration or expected duration over one year. Condition is bothersome/symptomatic for patient. Currently flared.  Intertrigo is a chronic recurrent rash that occurs in skin fold areas that may be associated with friction; heat; moisture; yeast; fungus; and bacteria.  It is exacerbated by increased movement / activity; sweating; and higher atmospheric temperature.  Use of an absorbant powder such as Zeasorb AF powder or other OTC antifungal powder to the area daily can prevent rash recurrence. Other options to help keep the area dry include blow drying the area after bathing or using antiperspirant products such as Duradry sweat minimizing gel.  Treatment Plan: Start hydrocortisone  2.5% cream twice a day as needed to affected areas for up to 2 weeks. Use as directed. Long-term use can cause thinning of the skin.  Topical steroids (such as triamcinolone , fluocinolone, fluocinonide, mometasone, clobetasol , halobetasol, betamethasone,  hydrocortisone ) can cause thinning and lightening of the skin if they are used for too long in the same area. Your physician has selected the right strength medicine for your problem and area affected on the body. Please use your medication only as directed by your physician to prevent side effects.   Start ketoconazole  2% cream once to twice a day as needed to aa, inframammary.  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 It is often associated with allergic rhinitis and/or asthma and can require treatment with topical medications, phototherapy, or in severe cases biologic injectable medication (Dupixent; Adbry) or Oral JAK inhibitors.  Treatment Plan: Moisturize BID Start Hydrocort  2.5 BID prn  Recommend gentle skin care.  HISTORY OF SQUAMOUS CELL CARCINOMA OF THE SKIN Right hand dorsum- treated w/ Mohs Dr. Paci- 03/10/2024 - No evidence of recurrence today - No lymphadenopathy - Recommend regular full body skin exams - Recommend daily broad spectrum sunscreen SPF 30+ to sun-exposed areas, reapply every 2 hours as needed.  - Call if any new or changing lesions are noted between office visits  AK (ACTINIC KERATOSIS) Proximal to R second MCP Actinic keratoses are precancerous spots that appear secondary to cumulative UV radiation exposure/sun exposure over time. They are chronic with expected duration over 1 year. A portion of actinic keratoses will progress to squamous cell carcinoma of the skin. It is not possible to reliably predict which spots will progress to skin cancer and so treatment is recommended to prevent development of skin cancer.  Recommend daily broad spectrum sunscreen  SPF 30+ to sun-exposed areas, reapply every 2 hours as needed.  Recommend staying in the shade or wearing long sleeves, sun glasses  (UVA+UVB protection) and wide brim hats (4-inch brim around the entire circumference of the hat). Call for new or changing lesions. Destruction of lesion - Proximal to R second MCP Complexity: simple   Destruction method: cryotherapy   Informed consent: discussed and consent obtained   Timeout:  patient name, date of birth, surgical site, and procedure verified Lesion destroyed using liquid nitrogen: Yes   Region frozen until ice ball extended beyond lesion: Yes   Cryo cycles: 1 or 2. Outcome: patient tolerated procedure well with no complications   Post-procedure details: wound care instructions given   Additional details:  Prior to procedure, discussed risks of blister formation, small wound, skin dyspigmentation, or rare scar following cryotherapy. Recommend Vaseline ointment to treated areas while healing.   INTERTRIGO   ATOPIC DERMATITIS, UNSPECIFIED TYPE   XEROSIS CUTIS    Return if symptoms worsen or fail to improve.  I, Jacquelynn V. Wilfred, CMA, am acting as scribe for Boneta Sharps, MD.  Documentation: I have reviewed the above documentation for accuracy and completeness, and I agree with the above.  Boneta Sharps, MD

## 2024-07-27 ENCOUNTER — Telehealth: Payer: Self-pay

## 2024-07-27 NOTE — Telephone Encounter (Signed)
 Copied from CRM #[id number]. Topic: General - Other >> Jul 27, 2024 10:34 AM Hadassah PARAS wrote: Reason for CRM: Pt is returing missed call from [place], [place], [place]. Relayed the following Good morning Mrs Ellenora,  so sorry I am just getting this message but we are not in our box on Saturday, and no one will see it until Mon, I have sent to [facility] to see what she may be able to do. Happy Holidays, Maeola Annita LATHER. No questions asked. Please call back with updated on med req [phone].

## 2024-07-27 NOTE — Telephone Encounter (Signed)
 LVM to call back and also sending my chart message as well

## 2024-07-27 NOTE — Telephone Encounter (Signed)
 Copied from CRM #[id number]. Topic: General - Other >> Jul 27, 2024 10:34 AM Hadassah PARAS wrote: Reason for CRM: Pt is returing missed call from [place], [place], [place]. Relayed the following Good morning Mrs Syndi,  so sorry I am just getting this message but we are not in our box on Saturday, and no one will see it until Mon, I have sent to [facility] to see what she may be able to do. Happy Holidays, Maeola Annita LATHER. No questions asked. Please call back with updated on med req [phone].  Error - I found the previous message this CRM should be attached to and attached it, so we can disregard this copy of the phone message.

## 2024-07-29 ENCOUNTER — Other Ambulatory Visit: Payer: Self-pay | Admitting: Family

## 2024-07-29 DIAGNOSIS — B370 Candidal stomatitis: Secondary | ICD-10-CM

## 2024-07-29 MED ORDER — CLOTRIMAZOLE 10 MG MT TROC: OROMUCOSAL | 0 refills | Status: AC

## 2024-08-05 ENCOUNTER — Other Ambulatory Visit: Payer: Self-pay | Admitting: Family

## 2024-08-05 DIAGNOSIS — I4891 Unspecified atrial fibrillation: Secondary | ICD-10-CM

## 2024-08-07 NOTE — Telephone Encounter (Signed)
 Phone call to pt clarify where she wanted prescription sent.  Refill for medication had been sent in December to [facility].  Pt reported that she wants this prescription moved back to [place].

## 2024-08-25 NOTE — Progress Notes (Unsigned)
 "     Electrophysiology Clinic Note    Date:  08/26/2024  Patient ID:  Gabrelle, Roca Jan 26, 1934, MRN [id number] PCP:  Dineen Rollene MATSU, FNP  Cardiologist:  Lonni Hanson, MD  Cardiology APP:  Gerard Frederick, NP  Electrophysiologist:  Fonda Kitty, MD  Electrophysiology APP:  Carmeline Kowal, NP     Discussed the use of AI scribe software for clinical note transcription with the patient, who gave verbal consent to proceed.   Patient Profile    Chief Complaint: Afib follow-up  History of Present Illness: NAKEYA ADINOLFI is a 89 y.o. female with PMH notable for persis Afib, PACs, HFmrEF; seen today for Fonda Kitty, MD (Previously Dr. Cindie) for routine electrophysiology followup.   She last saw Dr. Cindie 02/2024, AFib diagnosed earlier that month. She had DCCV with IRAF. I saw her 04/2024 and discussed initiating amiodarone. She was feeling much better with good functional status and did not want to pursue AAD.   On follow-up today, she has marked insomnia since this past summer, which correlates to her AF diagnosis. She questions whether one of her medications may be contributing to her insomnia.  She also notes itching and rash and questions whether the Eliquis  is causing that.  She does not recall our previous conversation about starting amiodarone or that she notified office that she did not want to pursue the medication. She is having significantly increased bilateral leg swelling, more prominent on the left.  She recalls that several months ago she had no swelling at all on her right but now has swelling bilaterally.  She does not enjoy taking her Lasix  because of needing to frequently urinate.  She denies chest pain, chest pressure, palpitations.  She does have increased fatigue but she believes this is due to poor nighttime sleep.  She does not check her blood pressure at home or have a BP cuff.  She is joined by her son.       Arrhythmia/Device History No specialty  comments available.    ROS:  Please see the history of present illness. All other systems are reviewed and otherwise negative.    Physical Exam    VS:  BP (!) 142/76 (BP Location: Left Arm, Patient Position: Sitting, Cuff Size: Normal)   Pulse (!) 108   Ht 5' 6 (1.676 m)   Wt 156 lb (70.8 kg)   SpO2 97%   BMI 25.18 kg/m  BMI: Body mass index is 25.18 kg/m.           Wt Readings from Last 3 Encounters:  08/26/24 156 lb (70.8 kg)  05/04/24 142 lb 6.4 oz (64.6 kg)  04/08/24 143 lb 9.6 oz (65.1 kg)     GEN- The patient is well appearing, alert and oriented x 3 today.   Lungs- Clear to ausculation bilaterally, normal work of breathing.  Heart- Irregularly irregular rate and rhythm, no murmurs, rubs or gallops Extremities- 1-2+ peripheral edema, warm, dry   Studies Reviewed   Previous EP, cardiology notes.    EKG is ordered. Personal review of EKG from today shows:   EKG Interpretation Date/Time:  Wednesday August 26 2024 14:01:32 EST Ventricular Rate:  108 PR Interval:    QRS Duration:  74 QT Interval:  362 QTC Calculation: 485 R Axis:   -30  Text Interpretation: Atrial fibrillation with rapid ventricular response Left axis deviation Confirmed by Sarina Robleto [phone]) on 08/26/2024 2:04:23 PM    TTE, 01/28/2024  1. Left ventricular ejection  fraction, by estimation, is 40 to 45%. Left ventricular ejection fraction by PLAX is 41 %. The left ventricle has mild to moderately decreased function. The left ventricle demonstrates global hypokinesis. There is moderate left ventricular hypertrophy. Left ventricular diastolic parameters are indeterminate.   2. Right ventricular systolic function is normal. The right ventricular size is normal. There is moderately elevated pulmonary artery systolic pressure. The estimated right ventricular systolic pressure is 58.0 mmHg.   3. Left atrial size was severely dilated.   4. Right atrial size was moderately dilated.   5. The mitral  valve is normal in structure. Mild to moderate mitral valve regurgitation.   6. Tricuspid valve regurgitation is mild to moderate.   7. The aortic valve is tricuspid. Aortic valve regurgitation is not visualized.   8. The inferior vena cava is dilated in size with <50% respiratory variability, suggesting right atrial pressure of 15 mmHg.   TTE, 01/09/2022  1. Left ventricular ejection fraction, by estimation, is 55 to 60%. The left ventricle has normal function. Left ventricular endocardial border not optimally defined to evaluate regional wall motion. There is mild left ventricular hypertrophy. Left ventricular diastolic parameters are indeterminate.   2. Right ventricular systolic function is normal. The right ventricular size is normal. There is mildly elevated pulmonary artery systolic pressure.   3. Left atrial size was moderately dilated.   4. Right atrial size was mildly dilated.   5. The mitral valve is abnormal. Mild to moderate mitral valve regurgitation. No evidence of mitral stenosis. There is mild late systolic prolapse of both leaflets of the mitral valve.   6. Tricuspid valve regurgitation is mild to moderate.   7. The aortic valve is tricuspid. There is mild thickening of the aortic valve. Aortic valve regurgitation is not visualized. Aortic valve sclerosis is present, with no evidence of aortic valve stenosis.   8. The inferior vena cava is dilated in size with >50% respiratory  variability, suggesting right atrial pressure of 8 mmHg.    Assessment and Plan     #) persis AFib S/p DCCV with IRAF We again discussed initiating amiodarone given patient's persistent atrial fibrillation with slightly reduced LVEF Patient stated she would again research amiodarone and notify office if she wished to pursue. Continue 25mg  toprol  daily  #) Hypercoag d/t persis afib CHA2DS2-VASc Score = at least 4 [CHF History: 1, HTN History: 0, Diabetes History: 0, Stroke History: 0, Vascular Disease  History: 0, Age Score: 2, Gender Score: 1].  Therefore, the patient's annual risk of stroke is 4.8 %.    Stroke ppx - 5mg  eliquis  BID, appropriately dosed No bleeding concerns  #) HFmrEF Most recent TTE with mid-range LVEF 40-45% Warm on exam, with noticeably increased lower extremity edema If potassium level is stable, plan to initiate spironolactone I will have her follow-up with the heart failure pharmacist for further GDMT titration Encouraged her to continue to take Lasix  daily at this time Continue 25mg  toprol  daily, do not favor increasing dose given patient appears to be in compensated heart failure Will update BMP, mag, BNP today      Current medicines are reviewed at length with the patient today.   The patient has concerns regarding her medicines.  The following changes were made today:  none  Labs/ tests ordered today include:  Orders Placed This Encounter  Procedures   Basic metabolic panel with GFR   Brain natriuretic peptide   Magnesium   EKG 12-Lead     Disposition: Follow  up with Dr. Kennyth or EP APP PRN based on whether she is agreeable to starting amiodarone   - HF pharmacist next week for GDMT titration - gen cards in 6-8 wks    Signed, Chantal Needle, NP  08/26/24  3:59 PM  Electrophysiology [facility] "

## 2024-08-26 ENCOUNTER — Ambulatory Visit: Attending: Cardiology | Admitting: Cardiology

## 2024-08-26 ENCOUNTER — Encounter: Payer: Self-pay | Admitting: Cardiology

## 2024-08-26 VITALS — BP 142/76 | HR 108 | Ht 66.0 in | Wt 156.0 lb

## 2024-08-26 DIAGNOSIS — I4819 Other persistent atrial fibrillation: Secondary | ICD-10-CM

## 2024-08-26 DIAGNOSIS — I5020 Unspecified systolic (congestive) heart failure: Secondary | ICD-10-CM

## 2024-08-26 DIAGNOSIS — D6869 Other thrombophilia: Secondary | ICD-10-CM

## 2024-08-26 NOTE — Patient Instructions (Signed)
 Medication Instructions:  Your physician recommends that you continue on your current medications as directed. Please refer to the Current Medication list given to you today.  *If you need a refill on your cardiac medications before your next appointment, please call your pharmacy*  Amiodarone is the medication for atrial fibrillation (afib).  Please take Lasix  for swelling daily.  Please obtain a blood pressure cuff and keep track of blood pressures and heart rate.  You will be contacted by the Heart Failure Clinic Pharmacist, Nason Wise.    Lab Work: Your provider would like for you to have following labs drawn today BMP, BNP and Magnesium.     Testing/Procedures: No test ordered today   Follow-Up: At [facility], you and your health needs are our priority.  As part of our continuing mission to provide you with exceptional heart care, our providers are all part of one team.  This team includes your primary Cardiologist (physician) and Advanced Practice Providers or APPs (Physician Assistants and Nurse Practitioners) who all work together to provide you with the care you need, when you need it.  Your next appointment:   6 month(s)  Provider:   Suzann Riddle, NP

## 2024-08-27 ENCOUNTER — Ambulatory Visit: Payer: Self-pay | Admitting: Cardiology

## 2024-08-27 LAB — BASIC METABOLIC PANEL WITH GFR
BUN/Creatinine Ratio: 22 (ref 12–28)
BUN: 16 mg/dL (ref 8–27)
CO2: 23 mmol/L (ref 20–29)
Calcium: 8.8 mg/dL (ref 8.7–10.3)
Chloride: 103 mmol/L (ref 96–106)
Creatinine, Ser: 0.73 mg/dL (ref 0.57–1.00)
Glucose: 83 mg/dL (ref 70–99)
Potassium: 4.5 mmol/L (ref 3.5–5.2)
Sodium: 141 mmol/L (ref 134–144)
eGFR: 79 mL/min/1.73

## 2024-08-27 LAB — BRAIN NATRIURETIC PEPTIDE: BNP: 299.3 pg/mL — ABNORMAL HIGH (ref 0.0–100.0)

## 2024-08-27 LAB — MAGNESIUM: Magnesium: 2 mg/dL (ref 1.6–2.3)

## 2024-08-27 NOTE — Telephone Encounter (Signed)
 I left a voicemail for [name] to ask that she return my call in regards to lab results and medication changes. As per the My Chart message also sent by Chantal Needle, NP;     Labs are overall stable from prior. BNP has not resulted yet.    Let's start 12.5mg  spiro and stop the potassium supplement.  I'll need repeat labs in 1 week. This can be done at the [facility] pharmacy appt. Please return their call to be scheduled.  I stated that I would not schedule labs or send the prescription until I hear back from Ms. Elizabeth.

## 2024-09-01 ENCOUNTER — Telehealth: Payer: Self-pay | Admitting: Pharmacist

## 2024-09-01 NOTE — Telephone Encounter (Signed)
Confirmed visit for tomorrow

## 2024-09-02 ENCOUNTER — Ambulatory Visit

## 2024-09-02 NOTE — Progress Notes (Unsigned)
 " [facility] Note  PCP: Dineen Rollene MATSU, FNP PCP-Cardiologist: Lonni Hanson, MD HF-Cardiologist: None  HPI:  Elizabeth Fernandez is a 89 y.o. female with PMH notable for persistent AFib, PACs, HFmrEF, history of DVT, left femur fracture 2016, osteoporosis, hypothyroidism.   - 02/2017 Patient started Xarelto  after presumed provoked DVT after sacral fracture/immobility. 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 Dr Marea: This demonstrates chronic appearing thrombus in the left popliteal vein which is nonocclusive and appear similar to the study from 2018.  - TTE 01/2024 noted LVEF 40-45%, global hypokinesis, moderate LVH, moderately elevated pulmonary artery systolic pressure, mild to moderate MVR, mild to moderate aortic valve regurgitation  She saw Electrophysiologist Dr. Cindie 02/2024, AFib diagnosed earlier that month. She had DCCV with IRAF. Electrophysiologist APP Elvie Needle, NP saw her 04/2024 and discussed initiating amiodarone. She was feeling much better with good functional status and did not want to pursue AAD.   Last seen by Elvie Needle, NP on 08/26/24 where she noted marked insomnia since this past summer, which correlates to her AF diagnosis. She questioned whether one of her medications may be contributing to her insomnia. She also noted itching and rash and questions whether the Eliquis  is causing that. She did not recall previous conversation about starting amiodarone or that she notified office that she did not want to pursue the medication. She was having significantly increased bilateral leg swelling, more prominent on the left. She recalled that several months ago she had no swelling at all on her right, but now has swelling bilaterally. She does not enjoy taking her Lasix  because  of needing to frequently urinate. No medication changes were made.  Today Nichole JAYSON Endow returns to [facility] for pharmacist medication titration. Reports feeling ***. {Reports/Denies:210917258} {ACTIONS;DENIES/REPORTS:21021675::Denies} being able to complete all activities of daily living (ADLs). Is *** active throughout the day. Weight at home is *** pounds. Takes {CHL AMB AH[facility] Medications:210917260} ***. Appetite ***. {Does Follow/Does Not Follow:210917261} a low sodium diet.  Current Heart Failure Medications: Loop diuretic: furosemide  20 mg daily (reported not taking, last filled 04/08/24 #90) Beta-Blocker: metoprolol  succinate 25 mg daily (last filled 08/11/24 #30) ACEI/ARB/ARNI: none MRA: none SGLT2i: none Other: none  Has the patient been experiencing any side effects to the medications prescribed? {yes/no:20286}  Does the patient have any problems obtaining medications due to transportation or finances? {yes/no:20286}  Understanding of regimen: {CHL AMB [facility] Excellent/Good/Fair/Poor:210917262}  Understanding of indications: {CHL AMB [facility] Excellent/Good/Fair/Poor:210917262}  Potential of adherence: {CHL AMB [facility] Excellent/Good/Fair/Poor:210917262}  Patient understands to avoid NSAIDs.  Patient understands to avoid decongestants.  Pertinent Lab Values: Creatinine  Date Value Ref Range Status  11/12/2014 0.65 mg/dL Final    Comment:    9.55-8.99 NOTE: New Reference Range  10/12/14    Creatinine, Ser  Date Value Ref Range Status  08/26/2024 0.73 0.57 - 1.00 mg/dL Final   BUN  Date Value Ref Range Status  08/26/2024 16 8 - 27 mg/dL Final  95/91/7983 15 mg/dL Final    Comment:    3-79 NOTE: New Reference Range  10/12/14    Potassium  Date Value Ref Range Status  08/26/2024 4.5 3.5 - 5.2 mmol/L Final  11/12/2014 4.0 mmol/L Final    Comment:    3.5-5.1 NOTE: New Reference Range  10/12/14    Sodium  Date Value Ref Range Status  08/26/2024 141 134  - 144 mmol/L Final  11/12/2014 138 mmol/L Final    Comment:    135-145 NOTE: New Reference Range  10/12/14    BNP  Date Value Ref Range Status  08/26/2024 299.3 (H) 0.0 - 100.0 pg/mL Final    Comment:    [employer] AD[employer]IA [employer] XP methodology   Magnesium  Date Value Ref Range Status  08/26/2024 2.0 1.6 - 2.3 mg/dL Final  95/92/7983 1.8 mg/dL Final    Comment:    8.2-7.5 THERAPEUTIC RANGE: 4-7 mg/dL TOXIC: > 10 mg/dL  ----------------------- NOTE: New Reference Range  10/12/14    TSH  Date Value Ref Range Status  12/23/2023 3.85 0.35 - 5.50 uIU/mL Final    Vital Signs: There were no vitals filed for this visit.  Assessment/Plan:  1) HFmrEF -  - Acute on chronic systolic heart failure TTE 01/2024 noted LVEF 40-45%, global hypokinesis, moderate LVH, moderately elevated pulmonary artery systolic pressure, mild to moderate MVR, mild to moderate aortic valve regurgitation, due to NICM. NYHA class ___ symptoms. - At visit with Elvie Needle, NP last week patient was warm on exam, with noticeably increased lower extremity edema. Reassess today given history of DVT. - Restart furosemide  20 mg daily today if volume overloaded. If euvolemic can change to PRN.  - Continue metoprolol  succinate 25 mg daily. - Consider losartan 12.5 or 25 mg pending BP. - Consider spironolactone 12.5 mg daily in near future. K 4.5 and patient stopped taking furosemide  due to increased urination.  - Consider SGLT2i pending copay check. A1c 5.6% (12/23/23), no history GU infections. - Labs 08/26/24: K 4.5, Scr 0.73, Mg 2.0, BNP 299.3  2) Persistent Afib -  S/p DCCV with IRAF - Amiodarone discussed with patient at electrophysiologist appointment last week, patient stated she would research amiodarone and notify office if she wished to pursue. - Continue metoprolol  succinate 25 mg daily.   3) Hypercoagulation due to persistent Afib - - CHA2DS2-VASc Score = at least 4 [CHF History: 1, HTN History: 0,  Diabetes History: 0, Stroke History: 0, Vascular Disease History: 0, Age Score: 2, Gender Score: 1].  Therefore, patient's annual risk of stroke is 4.8%.    - Continue Eliquis  5 mg BID for stroke prophylaxis. No need to dose adjust: Age > 80, Scr < 1.5, Weight > 60 kg. No bleeding concerns.  Follow up: 2 weeks with pharmacy clinic  Seen alongside Calton Nash, [place] PharmD Candidate   "

## 2024-09-30 ENCOUNTER — Ambulatory Visit: Admitting: Cardiology

## 2025-01-01 ENCOUNTER — Ambulatory Visit
